# Patient Record
Sex: Female | Born: 1990 | Race: Black or African American | Hispanic: No | Marital: Single | Smoking: Former smoker
Health system: Southern US, Community
[De-identification: ages and names within clinical notes are randomized; demographics above are authoritative.]

## PROBLEM LIST (undated history)

## (undated) ENCOUNTER — Inpatient Hospital Stay (HOSPITAL_COMMUNITY): Payer: Self-pay

## (undated) DIAGNOSIS — O09299 Supervision of pregnancy with other poor reproductive or obstetric history, unspecified trimester: Secondary | ICD-10-CM

## (undated) DIAGNOSIS — D573 Sickle-cell trait: Secondary | ICD-10-CM

## (undated) DIAGNOSIS — A159 Respiratory tuberculosis unspecified: Secondary | ICD-10-CM

## (undated) DIAGNOSIS — L309 Dermatitis, unspecified: Secondary | ICD-10-CM

## (undated) DIAGNOSIS — D649 Anemia, unspecified: Secondary | ICD-10-CM

## (undated) HISTORY — PX: THERAPEUTIC ABORTION: SHX798

## (undated) HISTORY — DX: Anemia, unspecified: D64.9

## (undated) HISTORY — PX: DILATION AND CURETTAGE OF UTERUS: SHX78

---
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
## 2011-01-07 ENCOUNTER — Other Ambulatory Visit: Payer: Self-pay | Admitting: Obstetrics & Gynecology

## 2011-01-07 ENCOUNTER — Ambulatory Visit (INDEPENDENT_AMBULATORY_CARE_PROVIDER_SITE_OTHER): Payer: Medicaid Other | Admitting: Family Medicine

## 2011-01-07 VITALS — BP 122/69 | Temp 97.6°F | Wt 151.6 lb

## 2011-01-07 DIAGNOSIS — O09299 Supervision of pregnancy with other poor reproductive or obstetric history, unspecified trimester: Secondary | ICD-10-CM

## 2011-01-07 DIAGNOSIS — O0990 Supervision of high risk pregnancy, unspecified, unspecified trimester: Secondary | ICD-10-CM

## 2011-01-07 DIAGNOSIS — Z23 Encounter for immunization: Secondary | ICD-10-CM

## 2011-01-07 LAB — POCT URINALYSIS DIP (DEVICE)
Bilirubin Urine: NEGATIVE
Glucose, UA: NEGATIVE mg/dL
Hgb urine dipstick: NEGATIVE
Ketones, ur: NEGATIVE mg/dL
Nitrite: NEGATIVE
Specific Gravity, Urine: 1.02 (ref 1.005–1.030)
pH: 6 (ref 5.0–8.0)

## 2011-01-07 LAB — CBC
HCT: 34.7 % — ABNORMAL LOW (ref 36.0–46.0)
Hemoglobin: 12.3 g/dL (ref 12.0–15.0)
MCH: 27.7 pg (ref 26.0–34.0)
MCHC: 35.4 g/dL (ref 30.0–36.0)
MCV: 78.2 fL (ref 78.0–100.0)
RBC: 4.44 MIL/uL (ref 3.87–5.11)

## 2011-01-07 MED ORDER — TETANUS-DIPHTH-ACELL PERTUSSIS 5-2.5-18.5 LF-MCG/0.5 IM SUSP
0.5000 mL | Freq: Once | INTRAMUSCULAR | Status: AC
  Administered 2011-01-07: 0.5 mL via INTRAMUSCULAR

## 2011-01-07 MED ORDER — INFLUENZA VIRUS VACC SPLIT PF IM SUSP
0.5000 mL | Freq: Once | INTRAMUSCULAR | Status: AC
  Administered 2011-01-07: 0.5 mL via INTRAMUSCULAR

## 2011-01-07 NOTE — Progress Notes (Signed)
Pulse-82.  Needs 28wk labs

## 2011-01-07 NOTE — Patient Instructions (Addendum)
Fetal Movement Counts Patient Name: __________________________________________________ Patient Due Date: ____________________ Kick counts is highly recommended in high risk pregnancies, but it is a good idea for every pregnant woman to do. Start counting fetal movements at 28 weeks of the pregnancy. Fetal movements increase after eating a full meal or eating or drinking something sweet (the blood sugar is higher). It is also important to drink plenty of fluids (well hydrated) before doing the count. Lie on your left side because it helps with the circulation or you can sit in a comfortable chair with your arms over your belly (abdomen) with no distractions around you. DOING THE COUNT  Try to do the count the same time of day each time you do it.   Mark the day and time, then see how long it takes for you to feel 10 movements (kicks, flutters, swishes, rolls). You should have at least 10 movements within 2 hours. You will most likely feel 10 movements in much less than 2 hours. If you do not, wait an hour and count again. After a couple of days you will see a pattern.   What you are looking for is a change in the pattern or not enough counts in 2 hours. Is it taking longer in time to reach 10 movements?  SEEK MEDICAL CARE IF:  You feel less than 10 counts in 2 hours. Tried twice.   No movement in one hour.   The pattern is changing or taking longer each day to reach 10 counts in 2 hours.   You feel the baby is not moving as it usually does.  Date: ____________ Movements: ____________ Start time: ____________ Finish time: ____________  Date: ____________ Movements: ____________ Start time: ____________ Finish time: ____________ Date: ____________ Movements: ____________ Start time: ____________ Finish time: ____________ Date: ____________ Movements: ____________ Start time: ____________ Finish time: ____________ Date: ____________ Movements: ____________ Start time: ____________ Finish time:  ____________ Date: ____________ Movements: ____________ Start time: ____________ Finish time: ____________ Date: ____________ Movements: ____________ Start time: ____________ Finish time: ____________ Date: ____________ Movements: ____________ Start time: ____________ Finish time: ____________  Date: ____________ Movements: ____________ Start time: ____________ Finish time: ____________ Date: ____________ Movements: ____________ Start time: ____________ Finish time: ____________ Date: ____________ Movements: ____________ Start time: ____________ Finish time: ____________ Date: ____________ Movements: ____________ Start time: ____________ Finish time: ____________ Date: ____________ Movements: ____________ Start time: ____________ Finish time: ____________ Date: ____________ Movements: ____________ Start time: ____________ Finish time: ____________ Date: ____________ Movements: ____________ Start time: ____________ Finish time: ____________  Date: ____________ Movements: ____________ Start time: ____________ Finish time: ____________ Date: ____________ Movements: ____________ Start time: ____________ Finish time: ____________ Date: ____________ Movements: ____________ Start time: ____________ Finish time: ____________ Date: ____________ Movements: ____________ Start time: ____________ Finish time: ____________ Date: ____________ Movements: ____________ Start time: ____________ Finish time: ____________ Date: ____________ Movements: ____________ Start time: ____________ Finish time: ____________ Date: ____________ Movements: ____________ Start time: ____________ Finish time: ____________  Date: ____________ Movements: ____________ Start time: ____________ Finish time: ____________ Date: ____________ Movements: ____________ Start time: ____________ Finish time: ____________ Date: ____________ Movements: ____________ Start time: ____________ Finish time: ____________ Date: ____________ Movements:  ____________ Start time: ____________ Finish time: ____________ Date: ____________ Movements: ____________ Start time: ____________ Finish time: ____________ Date: ____________ Movements: ____________ Start time: ____________ Finish time: ____________ Date: ____________ Movements: ____________ Start time: ____________ Finish time: ____________  Date: ____________ Movements: ____________ Start time: ____________ Finish time: ____________ Date: ____________ Movements: ____________ Start time: ____________ Finish time: ____________ Date: ____________ Movements: ____________ Start time:   ____________ Doreatha Martin time: ____________ Date: ____________ Movements: ____________ Start time: ____________ Doreatha Martin time: ____________ Date: ____________ Movements: ____________ Start time: ____________ Doreatha Martin time: ____________ Date: ____________ Movements: ____________ Start time: ____________ Doreatha Martin time: ____________ Date: ____________ Movements: ____________ Start time: ____________ Doreatha Martin time: ____________  Date: ____________ Movements: ____________ Start time: ____________ Doreatha Martin time: ____________ Date: ____________ Movements: ____________ Start time: ____________ Doreatha Martin time: ____________ Date: ____________ Movements: ____________ Start time: ____________ Doreatha Martin time: ____________ Date: ____________ Movements: ____________ Start time: ____________ Doreatha Martin time: ____________ Date: ____________ Movements: ____________ Start time: ____________ Doreatha Martin time: ____________ Date: ____________ Movements: ____________ Start time: ____________ Doreatha Martin time: ____________ Date: ____________ Movements: ____________ Start time: ____________ Doreatha Martin time: ____________  Date: ____________ Movements: ____________ Start time: ____________ Doreatha Martin time: ____________ Date: ____________ Movements: ____________ Start time: ____________ Doreatha Martin time: ____________ Date: ____________ Movements: ____________ Start time: ____________ Doreatha Martin  time: ____________ Date: ____________ Movements: ____________ Start time: ____________ Doreatha Martin time: ____________ Date: ____________ Movements: ____________ Start time: ____________ Doreatha Martin time: ____________ Date: ____________ Movements: ____________ Start time: ____________ Doreatha Martin time: ____________ Date: ____________ Movements: ____________ Start time: ____________ Doreatha Martin time: ____________  Date: ____________ Movements: ____________ Start time: ____________ Doreatha Martin time: ____________ Date: ____________ Movements: ____________ Start time: ____________ Doreatha Martin time: ____________ Date: ____________ Movements: ____________ Start time: ____________ Doreatha Martin time: ____________ Date: ____________ Movements: ____________ Start time: ____________ Doreatha Martin time: ____________ Date: ____________ Movements: ____________ Start time: ____________ Doreatha Martin time: ____________ Date: ____________ Movements: ____________ Start time: ____________ Doreatha Martin time: ____________ Document Released: 03/24/2006 Document Revised: 11/04/2010 Document Reviewed: 09/24/2008 [employer] Patient Information 2012 [employer]. Pregnancy - Third Trimester The third trimester of pregnancy (the last 3 months) is a period of the most rapid growth for you and your baby. The baby approaches a length of 20 inches and a weight of 6 to 10 pounds. The baby is adding on fat and getting ready for life outside your body. While inside, babies have periods of sleeping and waking, suck their thumbs, and hiccups. You can often feel small contractions of the uterus. This is false labor. It is also called Braxton-Hicks contractions. This is like a practice for labor. The usual problems in this stage of pregnancy include more difficulty breathing, swelling of the hands and feet from water retention, and having to urinate more often because of the uterus and baby pressing on your bladder.  PRENATAL EXAMS  Blood work may continue to be done during prenatal exams.  These tests are done to check on your health and the probable health of your baby. Blood work is used to follow your blood levels (hemoglobin). Anemia (low hemoglobin) is common during pregnancy. Iron and vitamins are given to help prevent this. You may also continue to be checked for diabetes. Some of the past blood tests may be done again.   The size of the uterus is measured during each visit. This makes sure your baby is growing properly according to your pregnancy dates.   Your blood pressure is checked every prenatal visit. This is to make sure you are not getting toxemia.   Your urine is checked every prenatal visit for infection, diabetes and protein.   Your weight is checked at each visit. This is done to make sure gains are happening at the suggested rate and that you and your baby are growing normally.   Sometimes, an ultrasound is performed to confirm the position and the proper growth and development of the baby. This is a test done that bounces harmless sound waves off the baby so your caregiver can more  accurately determine due dates.   Discuss the type of pain medication and anesthesia you will have during your labor and delivery.   Discuss the possibility and anesthesia if a Cesarean Section might be necessary.   Inform your caregiver if there is any mental or physical violence at home.  Sometimes, a specialized non-stress test, contraction stress test and biophysical profile are done to make sure the baby is not having a problem. Checking the amniotic fluid surrounding the baby is called an amniocentesis. The amniotic fluid is removed by sticking a needle into the belly (abdomen). This is sometimes done near the end of pregnancy if an early delivery is required. In this case, it is done to help make sure the baby's lungs are mature enough for the baby to live outside of the womb. If the lungs are not mature and it is unsafe to deliver the baby, an injection of cortisone medication  is given to the mother 1 to 2 days before the delivery. This helps the baby's lungs mature and makes it safer to deliver the baby. CHANGES OCCURING IN THE THIRD TRIMESTER OF PREGNANCY Your body goes through many changes during pregnancy. They vary from person to person. Talk to your caregiver about changes you notice and are concerned about.  During the last trimester, you have probably had an increase in your appetite. It is normal to have cravings for certain foods. This varies from person to person and pregnancy to pregnancy.   You may begin to get stretch marks on your hips, abdomen, and breasts. These are normal changes in the body during pregnancy. There are no exercises or medications to take which prevent this change.   Constipation may be treated with a stool softener or adding bulk to your diet. Drinking lots of fluids, fiber in vegetables, fruits, and whole grains are helpful.   Exercising is also helpful. If you have been very active up until your pregnancy, most of these activities can be continued during your pregnancy. If you have been less active, it is helpful to start an exercise program such as walking. Consult your caregiver before starting exercise programs.   Avoid all smoking, alcohol, un-prescribed drugs, herbs and "street drugs" during your pregnancy. These chemicals affect the formation and growth of the baby. Avoid chemicals throughout the pregnancy to ensure the delivery of a healthy infant.   Backache, varicose veins and hemorrhoids may develop or get worse.   You will tire more easily in the third trimester, which is normal.   The baby's movements may be stronger and more often.   You may become short of breath easily.   Your belly button may stick out.   A yellow discharge may leak from your breasts called colostrum.   You may have a bloody mucus discharge. This usually occurs a few days to a week before labor begins.  HOME CARE INSTRUCTIONS   Keep your  caregiver's appointments. Follow your caregiver's instructions regarding medication use, exercise, and diet.   During pregnancy, you are providing food for you and your baby. Continue to eat regular, well-balanced meals. Choose foods such as meat, fish, milk and other low fat dairy products, vegetables, fruits, and whole-grain breads and cereals. Your caregiver will tell you of the ideal weight gain.   A physical sexual relationship may be continued throughout pregnancy if there are no other problems such as early (premature) leaking of amniotic fluid from the membranes, vaginal bleeding, or belly (abdominal) pain.   Exercise regularly  if there are no restrictions. Check with your caregiver if you are unsure of the safety of your exercises. Greater weight gain will occur in the last 2 trimesters of pregnancy. Exercising helps:   Control your weight.   Get you in shape for labor and delivery.   You lose weight after you deliver.   Rest a lot with legs elevated, or as needed for leg cramps or low back pain.   Wear a good support or jogging bra for breast tenderness during pregnancy. This may help if worn during sleep. Pads or tissues may be used in the bra if you are leaking colostrum.   Do not use hot tubs, steam rooms, or saunas.   Wear your seat belt when driving. This protects you and your baby if you are in an accident.   Avoid raw meat, cat litter boxes and soil used by cats. These carry germs that can cause birth defects in the baby.   It is easier to loose urine during pregnancy. Tightening up and strengthening the pelvic muscles will help with this problem. You can practice stopping your urination while you are going to the bathroom. These are the same muscles you need to strengthen. It is also the muscles you would use if you were trying to stop from passing gas. You can practice tightening these muscles up 10 times a set and repeating this about 3 times per day. Once you know what  muscles to tighten up, do not perform these exercises during urination. It is more likely to cause an infection by backing up the urine.   Ask for help if you have financial, counseling or nutritional needs during pregnancy. Your caregiver will be able to offer counseling for these needs as well as refer you for other special needs.   Make a list of emergency phone numbers and have them available.   Plan on getting help from family or friends when you go home from the hospital.   Make a trial run to the hospital.   Take prenatal classes with the father to understand, practice and ask questions about the labor and delivery.   Prepare the baby's room/nursery.   Do not travel out of the city unless it is absolutely necessary and with the advice of your caregiver.   Wear only low or no heal shoes to have better balance and prevent falling.  MEDICATIONS AND DRUG USE IN PREGNANCY  Take prenatal vitamins as directed. The vitamin should contain 1 milligram of folic acid. Keep all vitamins out of reach of children. Only a couple vitamins or tablets containing iron may be fatal to a baby or young child when ingested.   Avoid use of all medications, including herbs, over-the-counter medications, not prescribed or suggested by your caregiver. Only take over-the-counter or prescription medicines for pain, discomfort, or fever as directed by your caregiver. Do not use aspirin, ibuprofen (Motrin, Advil, Nuprin) or naproxen (Aleve) unless OK'd by your caregiver.   Let your caregiver also know about herbs you may be using.   Alcohol is related to a number of birth defects. This includes fetal alcohol syndrome. All alcohol, in any form, should be avoided completely. Smoking will cause low birth rate and premature babies.   Street/illegal drugs are very harmful to the baby. They are absolutely forbidden. A baby born to an addicted mother will be addicted at birth. The baby will go through the same  withdrawal an adult does.  SEEK MEDICAL CARE IF: You have  any concerns or worries during your pregnancy. It is better to call with your questions if you feel they cannot wait, rather than worry about them. DECISIONS ABOUT CIRCUMCISION You may or may not know the sex of your baby. If you know your baby is a boy, it may be time to think about circumcision. Circumcision is the removal of the foreskin of the penis. This is the skin that covers the sensitive end of the penis. There is no proven medical need for this. Often this decision is made on what is popular at the time or based upon religious beliefs and social issues. You can discuss these issues with your caregiver or pediatrician. SEEK IMMEDIATE MEDICAL CARE IF:   An unexplained oral temperature above 102 F (38.9 C) develops, or as your caregiver suggests.   You have leaking of fluid from the vagina (birth canal). If leaking membranes are suspected, take your temperature and tell your caregiver of this when you call.   There is vaginal spotting, bleeding or passing clots. Tell your caregiver of the amount and how many pads are used.   You develop a bad smelling vaginal discharge with a change in the color from clear to white.   You develop vomiting that lasts more than 24 hours.   You develop chills or fever.   You develop shortness of breath.   You develop burning on urination.   You loose more than 2 pounds of weight or gain more than 2 pounds of weight or as suggested by your caregiver.   You notice sudden swelling of your face, hands, and feet or legs.   You develop belly (abdominal) pain. Round ligament discomfort is a common non-cancerous (benign) cause of abdominal pain in pregnancy. Your caregiver still must evaluate you.   You develop a severe headache that does not go away.   You develop visual problems, blurred or double vision.   If you have not felt your baby move for more than 1 hour. If you think the baby is not  moving as much as usual, eat something with sugar in it and lie down on your left side for an hour. The baby should move at least 4 to 5 times per hour. Call right away if your baby moves less than that.   You fall, are in a car accident or any kind of trauma.   There is mental or physical violence at home.  Document Released: 02/16/2001 Document Revised: 11/04/2010 Document Reviewed: 08/21/2008 [facility] Patient Information 2012 [place], [place]. Birth Control Choices Birth control is the use of any practices, methods, or devices to prevent pregnancy from happening in a sexually active woman.  Below are some birth control choices to help avoid pregnancy.  Not having sex (abstinence) is the surest form of birth control. This requires self-control. There is no risk of acquiring a sexually transmitted disease (STD), including acquired immunodeficiency syndrome (AIDS).   Periodic abstinence requires self-control during certain times of the month.   Calendar method, timing your menstrual periods from month to month.   Ovulation method is avoiding sexual intercourse around the time you produce an egg (ovulate).   Symptotherm method is avoiding sexual intercourse at the time of ovulation, using a thermometer and ovulation symptoms.   Post ovulation method is the timing of sexual intercourse after you ovulated.  These methods do not protect against STDs, including AIDS.  Birth control pills (BCPs) contain estrogen and progesterone hormone. These medicines work by stopping the egg from  forming in the ovary (ovulation). Birth control pills are prescribed by a caregiver who will ask you questions about the risks of taking BCPs. Birth control pills do not protect against STDs, including AIDS.   "Minipill" birth control pills have only the progesterone hormone. They are taken every day of each month and must be prescribed by your caregiver. They do not protect against STDs, including AIDS.   Emergency  contraception is often call the "morning after" pill. This pill can be taken right after sex or up to five days after sex if you think your birth control failed, you failed to use contraception, or you were forced to have sex. It is most effective the sooner you take the pills after having sexual intercourse. Do not use emergency contraception as your only form of birth control. Emergency contraceptive pills are available without a prescription. Check with your pharmacist.   Condoms are a thin sheath of latex, synthetic material, or lambskin worn over the penis during sexual intercourse. They can have a spermicide in or on them when you buy them. Latex condoms can prevent pregnancy and STDs. "Natural" or lambskin condoms can prevent pregnancy but may not protect against STDs, including AIDS.   Female condoms are a soft, loose-fitting sheath that is put into the vagina before sexual intercourse. They can prevent pregnancy and STDs, including AIDS.   Sponge is a soft, circular piece of polyurethane foam with spermicide in it that is inserted into the vagina after wetting it and before sexual intercourse. It does not require a prescription from your caregiver. It does not protect against STDs, including AIDS.   Diaphragm is a soft, latex, dome-shaped barrier that must be fitted by a caregiver. It is inserted into the vagina, along with a spermicidal jelly. After the proper fitting for a diaphragm, always insert the diaphragm before intercourse. The diaphragm should be left in the vagina for 6 to 8 hours after intercourse. Removal and reinsertion with a spermicide is always necessary after any use. It does not protect against STDs, including AIDS.   Progesterone-only injections are given every 3 months to prevent pregnancy. These injections contain synthetic progesterone and no estrogen. This hormone stops the ovaries from releasing eggs. It also causes the cervical mucus to thicken and changes the uterine  lining. This makes it harder for sperm to survive in the uterus. It does not protect against STDs, including AIDS.   Birth Control Patch contains hormones similar to those in birth control pills, so effectiveness, risks, and side effects are similar. It must be changed once a week and is prescribed by a caregiver. It is less effective in very overweight women. It does not protect against STDs, including AIDS.   Vaginal Ring contains hormones similar to those in birth control pills. It is left in place for 3 weeks, removed for 1 week, and then a new one is put back into the vagina. It comes with a timer to put in your purse to help you remember when to take it out or put a new one in. A caregiver's examination and prescription is necessary, just like with birth control pills and the patch. It does not protect against STDs, including AIDS.   Estrogen plus progesterone injections are given every 28 to 30 days. They can be given in the upper arm, thigh, or buttocks. It does not protect against STDs, including AIDS.   Intrauterine device (IUD): copper T or progestin filled is a T-shaped device that is put in  a woman's uterus during a menstrual period to prevent pregnancy. The copper T IUD can last 10 years, and the progestin IUD can last 5 years. The progestin IUD can also help control heavy menstrual periods. It does not protect against STDs, including AIDS. The copper T IUD can be used as emergency contraception if inserted within 5 days of having unprotected intercourse.   Cervical cap is a round, soft latex or plastic cup that fits over the cervix and must be fitted by a caregiver. You do not need to use a spermicide with it or remove and insert it every time you have sexual intercourse. It does not protect against STDs, including AIDS.   Spermicides are chemicals that kill or block sperm from entering the cervix and uterus. They come in the form of creams, jellies, suppositories, foam, or tablets, and  they do not require a prescription. They are inserted into the vagina with an applicator before having sexual intercourse. This must be repeated every time you have sexual intercourse.   Withdrawal is using the method of the female withdrawing his penis from sexual intercourse before he has a climax and deposits his sperm. It does not protect against STDs, including AIDS.   Female tubal ligation is when the woman's fallopian tubes are surgically sealed or tied to prevent the egg from traveling to the uterus. It does not protect against STDs, including AIDS.   Female sterilization is when the female has his tubes that carry sperm tied off (vasectomy) to stop sperm from entering the vagina during sexual intercourse. It does not protect against STDs, including AIDS.  Regardless of which method of birth control you choose, it is still important that you use some form of protection against STDs. Document Released: 02/22/2005 Document Revised: 03/27/2010 Document Reviewed: 01/09/2009 [facility] Patient Information 2012 [place], [place]. Breastfeeding BENEFITS OF BREASTFEEDING For the baby  The first milk (colostrum) helps the baby's digestive system function better.   There are antibodies from the mother in the milk that help the baby fight off infections.   The baby has a lower incidence of asthma, allergies, and SIDS (sudden infant death syndrome).   The nutrients in breast milk are better than formulas for the baby and helps the baby's brain grow better.   Babies who breastfeed have less gas, colic, and constipation.  For the mother  Breastfeeding helps develop a very special bond between mother and baby.   It is more convenient, always available at the correct temperature and cheaper than formula feeding.   It burns calories in the mother and helps with losing weight that was gained during pregnancy.   It makes the uterus contract back down to normal size faster and slows bleeding following  delivery.   Breastfeeding mothers have a lower risk of developing breast cancer.  NURSE FREQUENTLY  A healthy, full-term baby may breastfeed as often as every hour or space his or her feedings to every 3 hours.   How often to nurse will vary from baby to baby. Watch your baby for signs of hunger, not the clock.   Nurse as often as the baby requests, or when you feel the need to reduce the fullness of your breasts.   Awaken the baby if it has been 3 to 4 hours since the last feeding.   Frequent feeding will help the mother make more milk and will prevent problems like sore nipples and engorgement of the breasts.  BABY'S POSITION AT THE BREAST  Whether lying  down or sitting, be sure that the baby's tummy is facing your tummy.   Support the breast with 4 fingers underneath the breast and the thumb above. Make sure your fingers are well away from the nipple and baby's mouth.   Stroke the baby's lips and cheek closest to the breast gently with your finger or nipple.   When the baby's mouth is open wide enough, place all of your nipple and as much of the dark area around the nipple as possible into your baby's mouth.   Pull the baby in close so the tip of the nose and the baby's cheeks touch the breast during the feeding.  FEEDINGS  The length of each feeding varies from baby to baby and from feeding to feeding.   The baby must suck about 2 to 3 minutes for your milk to get to him or her. This is called a "let down." For this reason, allow the baby to feed on each breast as long as he or she wants. Your baby will end the feeding when he or she has received the right balance of nutrients.   To break the suction, put your finger into the corner of the baby's mouth and slide it between his or her gums before removing your breast from his or her mouth. This will help prevent sore nipples.  REDUCING BREAST ENGORGEMENT  In the first week after your baby is born, you may experience signs of  breast engorgement. When breasts are engorged, they feel heavy, warm, full, and may be tender to the touch. You can reduce engorgement if you:   Nurse frequently, every 2 to 3 hours. Mothers who breastfeed early and often have fewer problems with engorgement.   Place light ice packs on your breasts between feedings. This reduces swelling. Wrap the ice packs in a lightweight towel to protect your skin.   Apply moist hot packs to your breast for 5 to 10 minutes before each feeding. This increases circulation and helps the milk flow.   Gently massage your breast before and during the feeding.   Make sure that the baby empties at least one breast at every feeding before switching sides.   Use a breast pump to empty the breasts if your baby is sleepy or not nursing well. You may also want to pump if you are returning to work or or you feel you are getting engorged.   Avoid bottle feeds, pacifiers or supplemental feedings of water or juice in place of breastfeeding.   Be sure the baby is latched on and positioned properly while breastfeeding.   Prevent fatigue, stress, and anemia.   Wear a supportive bra, avoiding underwire styles.   Eat a balanced diet with enough fluids.  If you follow these suggestions, your engorgement should improve in 24 to 48 hours. If you are still experiencing difficulty, call your lactation consultant or caregiver. IS MY BABY GETTING ENOUGH MILK? Sometimes, mothers worry about whether their babies are getting enough milk. You can be assured that your baby is getting enough milk if:  The baby is actively sucking and you hear swallowing.   The baby nurses at least 8 to 12 times in a 24 hour time period. Nurse your baby until he or she unlatches or falls asleep at the first breast (at least 10 to 20 minutes), then offer the second side.   The baby is wetting 5 to 6 disposable diapers (6 to 8 cloth diapers) in a 24 hour period  by 36 to 43 days of age.   The baby is  having at least 2 to 3 stools every 24 hours for the first few months. Breast milk is all the food your baby needs. It is not necessary for your baby to have water or formula. In fact, to help your breasts make more milk, it is best not to give your baby supplemental feedings during the early weeks.   The stool should be soft and yellow.   The baby should gain 4 to 7 ounces per week after he is 47 days old.  TAKE CARE OF YOURSELF Take care of your breasts by:  Bathing or showering daily.   Avoiding the use of soaps on your nipples.   Start feedings on your left breast at one feeding and on your right breast at the next feeding.   You will notice an increase in your milk supply 2 to 5 days after delivery. You may feel some discomfort from engorgement, which makes your breasts very firm and often tender. Engorgement "peaks" out within 24 to 48 hours. In the meantime, apply warm moist towels to your breasts for 5 to 10 minutes before feeding. Gentle massage and expression of some milk before feeding will soften your breasts, making it easier for your baby to latch on. Wear a well fitting nursing bra and air dry your nipples for 10 to 15 minutes after each feeding.   Only use cotton bra pads.   Only use pure lanolin on your nipples after nursing. You do not need to wash it off before nursing.  Take care of yourself by:   Eating well-balanced meals and nutritious snacks.   Drinking milk, fruit juice, and water to satisfy your thirst (about 8 glasses a day).   Getting plenty of rest.   Increasing calcium in your diet (1200 mg a day).   Avoiding foods that you notice affect the baby in a bad way.  SEEK MEDICAL CARE IF:   You have any questions or difficulty with breastfeeding.   You need help.   You have a hard, red, sore area on your breast, accompanied by a fever of 100.5 F (38.1 C) or more.   Your baby is too sleepy to eat well or is having trouble sleeping.   Your baby is  wetting less than 6 diapers per day, by 78 days of age.   Your baby's skin or white part of his or her eyes is more yellow than it was in the hospital.   You feel depressed.  Document Released: 02/22/2005 Document Revised: 11/04/2010 Document Reviewed: 10/07/2008 [facility] Patient Information 2012 [place], [place].

## 2011-01-07 NOTE — Progress Notes (Signed)
Doing well.  28 wk labs today, start weekly Bpp's next week, growth next week.

## 2011-01-08 LAB — GLUCOSE TOLERANCE, 1 HOUR: Glucose, 1 Hour GTT: 93 mg/dL (ref 70–140)

## 2011-01-11 ENCOUNTER — Telehealth: Payer: Self-pay | Admitting: *Deleted

## 2011-01-11 NOTE — Telephone Encounter (Signed)
Patient called today at [phone] and left a message " I wanted to see if I could take a cough medicine?"

## 2011-01-11 NOTE — Telephone Encounter (Signed)
Called patient and informed her may take delsym, robitussin plain or dm

## 2011-01-14 ENCOUNTER — Ambulatory Visit (INDEPENDENT_AMBULATORY_CARE_PROVIDER_SITE_OTHER): Payer: Medicaid Other | Admitting: Family Medicine

## 2011-01-14 ENCOUNTER — Other Ambulatory Visit: Payer: Self-pay | Admitting: Family Medicine

## 2011-01-14 VITALS — BP 119/70 | Temp 98.2°F | Wt 150.6 lb

## 2011-01-14 DIAGNOSIS — O0990 Supervision of high risk pregnancy, unspecified, unspecified trimester: Secondary | ICD-10-CM

## 2011-01-14 DIAGNOSIS — O09299 Supervision of pregnancy with other poor reproductive or obstetric history, unspecified trimester: Secondary | ICD-10-CM

## 2011-01-14 LAB — POCT URINALYSIS DIP (DEVICE)
Bilirubin Urine: NEGATIVE
Glucose, UA: NEGATIVE mg/dL
Hgb urine dipstick: NEGATIVE
Ketones, ur: NEGATIVE mg/dL
Nitrite: NEGATIVE
Specific Gravity, Urine: 1.02 (ref 1.005–1.030)
pH: 6.5 (ref 5.0–8.0)

## 2011-01-14 NOTE — Progress Notes (Signed)
Start weekly BPP's

## 2011-01-15 ENCOUNTER — Ambulatory Visit (HOSPITAL_COMMUNITY): Payer: Medicaid Other

## 2011-01-15 ENCOUNTER — Encounter (HOSPITAL_COMMUNITY): Payer: Self-pay | Admitting: *Deleted

## 2011-01-15 ENCOUNTER — Observation Stay (HOSPITAL_COMMUNITY)
Admission: AD | Admit: 2011-01-15 | Discharge: 2011-01-16 | Disposition: A | Discharge status: Home / Self Care | Payer: Medicaid Other | Source: Ambulatory Visit | Attending: Obstetrics & Gynecology | Admitting: Obstetrics & Gynecology

## 2011-01-15 ENCOUNTER — Inpatient Hospital Stay (HOSPITAL_COMMUNITY): Payer: Medicaid Other

## 2011-01-15 DIAGNOSIS — A499 Bacterial infection, unspecified: Secondary | ICD-10-CM | POA: Insufficient documentation

## 2011-01-15 DIAGNOSIS — N760 Acute vaginitis: Secondary | ICD-10-CM

## 2011-01-15 DIAGNOSIS — J069 Acute upper respiratory infection, unspecified: Secondary | ICD-10-CM | POA: Insufficient documentation

## 2011-01-15 DIAGNOSIS — O09299 Supervision of pregnancy with other poor reproductive or obstetric history, unspecified trimester: Secondary | ICD-10-CM

## 2011-01-15 DIAGNOSIS — O2390 Unspecified genitourinary tract infection in pregnancy, unspecified trimester: Secondary | ICD-10-CM

## 2011-01-15 DIAGNOSIS — O368390 Maternal care for abnormalities of the fetal heart rate or rhythm, unspecified trimester, not applicable or unspecified: Secondary | ICD-10-CM | POA: Insufficient documentation

## 2011-01-15 DIAGNOSIS — O9989 Other specified diseases and conditions complicating pregnancy, childbirth and the puerperium: Secondary | ICD-10-CM

## 2011-01-15 DIAGNOSIS — IMO0002 Reserved for concepts with insufficient information to code with codable children: Secondary | ICD-10-CM

## 2011-01-15 DIAGNOSIS — B9689 Other specified bacterial agents as the cause of diseases classified elsewhere: Secondary | ICD-10-CM | POA: Insufficient documentation

## 2011-01-15 DIAGNOSIS — O99891 Other specified diseases and conditions complicating pregnancy: Principal | ICD-10-CM | POA: Insufficient documentation

## 2011-01-15 DIAGNOSIS — D573 Sickle-cell trait: Secondary | ICD-10-CM | POA: Insufficient documentation

## 2011-01-15 HISTORY — DX: Sickle-cell trait: D57.3

## 2011-01-15 LAB — URINALYSIS, ROUTINE W REFLEX MICROSCOPIC
Bilirubin Urine: NEGATIVE
Hgb urine dipstick: NEGATIVE
Specific Gravity, Urine: 1.02 (ref 1.005–1.030)
Urobilinogen, UA: 0.2 mg/dL (ref 0.0–1.0)
pH: 6.5 (ref 5.0–8.0)

## 2011-01-15 LAB — WET PREP, GENITAL: Trich, Wet Prep: NONE SEEN

## 2011-01-15 MED ORDER — DM-GUAIFENESIN ER 30-600 MG PO TB12
1.0000 | ORAL_TABLET | Freq: Two times a day (BID) | ORAL | Status: DC
  Administered 2011-01-15 – 2011-01-16 (×3): 1 via ORAL
  Filled 2011-01-15 (×5): qty 1

## 2011-01-15 MED ORDER — LORATADINE 10 MG PO TABS
10.0000 mg | ORAL_TABLET | Freq: Every day | ORAL | Status: DC
  Administered 2011-01-15 – 2011-01-16 (×2): 10 mg via ORAL
  Filled 2011-01-15 (×3): qty 1

## 2011-01-15 MED ORDER — CALCIUM CARBONATE ANTACID 500 MG PO CHEW
2.0000 | CHEWABLE_TABLET | ORAL | Status: DC | PRN
  Filled 2011-01-15: qty 2

## 2011-01-15 MED ORDER — IPRATROPIUM BROMIDE 0.02 % IN SOLN
0.5000 mg | Freq: Once | RESPIRATORY_TRACT | Status: AC
  Administered 2011-01-15: 0.5 mg via RESPIRATORY_TRACT
  Filled 2011-01-15: qty 2.5

## 2011-01-15 MED ORDER — ALBUTEROL SULFATE (5 MG/ML) 0.5% IN NEBU
5.0000 mg | INHALATION_SOLUTION | RESPIRATORY_TRACT | Status: DC | PRN
  Filled 2011-01-15: qty 1

## 2011-01-15 MED ORDER — IPRATROPIUM BROMIDE 0.02 % IN SOLN
0.5000 mg | RESPIRATORY_TRACT | Status: DC | PRN
  Filled 2011-01-15: qty 2.5

## 2011-01-15 MED ORDER — ALBUTEROL SULFATE (5 MG/ML) 0.5% IN NEBU
5.0000 mg | INHALATION_SOLUTION | Freq: Once | RESPIRATORY_TRACT | Status: AC
  Administered 2011-01-15: 5 mg via RESPIRATORY_TRACT
  Filled 2011-01-15 (×2): qty 0.5

## 2011-01-15 MED ORDER — PRENATAL PLUS 27-1 MG PO TABS
1.0000 | ORAL_TABLET | Freq: Every day | ORAL | Status: DC
  Administered 2011-01-15 – 2011-01-16 (×2): 1 via ORAL
  Filled 2011-01-15 (×4): qty 1

## 2011-01-15 MED ORDER — DOCUSATE SODIUM 100 MG PO CAPS
100.0000 mg | ORAL_CAPSULE | Freq: Every day | ORAL | Status: DC
  Administered 2011-01-15 – 2011-01-16 (×2): 100 mg via ORAL
  Filled 2011-01-15 (×4): qty 1

## 2011-01-15 MED ORDER — METRONIDAZOLE 500 MG PO TABS
500.0000 mg | ORAL_TABLET | Freq: Two times a day (BID) | ORAL | Status: DC
  Administered 2011-01-15 – 2011-01-16 (×3): 500 mg via ORAL
  Filled 2011-01-15 (×5): qty 1

## 2011-01-15 MED ORDER — HYDROCOD POLST-CHLORPHEN POLST 10-8 MG/5ML PO LQCR: 5.0000 mL | Freq: Two times a day (BID) | ORAL | Status: DC | PRN

## 2011-01-15 MED ORDER — ZOLPIDEM TARTRATE 10 MG PO TABS: 10.0000 mg | ORAL_TABLET | Freq: Every evening | ORAL | Status: DC | PRN

## 2011-01-15 MED ORDER — ACETAMINOPHEN 325 MG PO TABS: 650.0000 mg | ORAL_TABLET | ORAL | Status: DC | PRN

## 2011-01-15 NOTE — H&P (Signed)
I saw and examined patient along with student and agree with above note.   

## 2011-01-15 NOTE — Progress Notes (Signed)
PT SAYS SHE HAS SNEEZING, RUNNY NOSE,  COUGH,  NO SORE THROAT,    NO VOMITING,   NO DIARRHEA. Marland Kitchen GOES TO [employer]- DID NOT CALL ABOUT HEAD COLD.  HAS NOT TAKEN  ANY MED.     HAS HAD COLD X1  WEEK.   SAYS HAS PAIN IN RIBS  AND VAGINA- FEELS LIKE PRESSURE AND STRETCHING.  - STARTED AT  0100  .  HAS STILLBORN IN 2010.

## 2011-01-15 NOTE — Progress Notes (Signed)
PT BROUGHT WITH  HER  A PIECE OF MUCUS IN A BAG

## 2011-01-15 NOTE — Progress Notes (Signed)
UR Chart review completed.  

## 2011-01-15 NOTE — ED Provider Notes (Signed)
History   20 y.o. G2P1000 at [redacted]w[redacted]d presents with report of sneezing, rhinorrhea, productive cough of thick yellow phlegm x approximately 1 week.  Denies sore throat.  Hasn't used any OTC meds.  Denies SOB, CP, n/v/d, ear pain.  Also reports intermittent sharp pelvic pain and achiness with position change or fetal movement.  Reports +fm, denies LOF, VB, or UCs.  Did have thick clearish-yellow vaginal mucous earlier that she brought in a bag.      HPI  OB History    Grav Para Term Preterm Abortions TAB SAB Ect Mult Living   2 1 1        0      Past Medical History  Diagnosis Date   Eczema    Anemia     History reviewed. No pertinent past surgical history.  Family History  Problem Relation Age of Onset   Hypertension Father     History  Substance Use Topics   Smoking status: Never Smoker    Smokeless tobacco: Never Used   Alcohol Use: No    Allergies: No Known Allergies  Prescriptions prior to admission  Medication Sig Dispense Refill   prenatal vitamin w/FE, FA (PRENATAL 1 + 1) 27-1 MG TABS Take 1 tablet by mouth daily.         DISCONTD: Prenatal Vit-Fe Fumarate-FA (PRENATAL MULTIVITAMIN) 60-1 MG tablet Take 1 tablet by mouth daily.  30 tablet  0    Review of Systems  Constitutional: Negative.   HENT:       Rhinorrhea  Eyes: Negative.   Respiratory: Positive for cough, sputum production (thick clearish-yellow phlegm) and wheezing. Negative for shortness of breath.   Cardiovascular: Negative.   Gastrointestinal: Negative.  Negative for nausea, vomiting, abdominal pain and diarrhea.  Genitourinary: Negative for dysuria, frequency and flank pain.       Intermittent sharp pelvic pain and achiness with position change and fetal movement  Musculoskeletal: Negative.   Skin: Negative.   Neurological: Negative.   Endo/Heme/Allergies: Negative.   Psychiatric/Behavioral: Negative.    Physical Exam   Blood pressure 111/85, pulse 70, temperature 98 F (36.7 C),  temperature source Oral, resp. rate 20, height 5\' 4"  (1.626 m), weight 70.478 kg (155 lb 6 oz), last menstrual period 07/02/2010, SpO2 98.00%.  Physical Exam  Constitutional: She is oriented to person, place, and time. She appears well-developed and well-nourished.  HENT:  Head: Normocephalic.  Eyes: Pupils are equal, round, and reactive to light.  Neck: Normal range of motion.  Cardiovascular: Normal rate, regular rhythm and normal heart sounds.   Respiratory: Effort normal. No respiratory distress. She has wheezes (inspiratory, bilateral upper lobes). She has no rales. She exhibits no tenderness.       No SOB  GI: Soft. Bowel sounds are normal. There is tenderness (pelvic).  Genitourinary: Uterus normal. Vaginal discharge (thick clearish-yellow mucous coming from os) found.       Cervix visually closed on speculum exam   Musculoskeletal: Normal range of motion.  Neurological: She is alert and oriented to person, place, and time. She has normal reflexes.  Skin: Skin is warm and dry.  Psychiatric: She has a normal mood and affect. Her behavior is normal. Judgment and thought content normal.    MAU Course  Procedures  Wet prep GC/CT Results for orders placed during the hospital encounter of 01/15/11 (from the past 24 hour(s))  URINALYSIS, ROUTINE W REFLEX MICROSCOPIC     Status: Normal   Collection Time   01/15/11  1:39 AM      Component Value Range   Color, Urine YELLOW  YELLOW    Appearance CLEAR  CLEAR    Specific Gravity, Urine 1.020  1.005 - 1.030    pH 6.5  5.0 - 8.0    Glucose, UA NEGATIVE  NEGATIVE (mg/dL)   Hgb urine dipstick NEGATIVE  NEGATIVE    Bilirubin Urine NEGATIVE  NEGATIVE    Ketones, ur NEGATIVE  NEGATIVE (mg/dL)   Protein, ur NEGATIVE  NEGATIVE (mg/dL)   Urobilinogen, UA 0.2  0.0 - 1.0 (mg/dL)   Nitrite NEGATIVE  NEGATIVE    Leukocytes, UA NEGATIVE  NEGATIVE   WET PREP, GENITAL     Status: Abnormal   Collection Time   01/15/11  4:15 AM      Component  Value Range   Yeast, Wet Prep NONE SEEN  NONE SEEN    Trich, Wet Prep NONE SEEN  NONE SEEN    Clue Cells, Wet Prep MODERATE (*) NONE SEEN    WBC, Wet Prep HPF POC FEW (*) NONE SEEN       Assessment and Plan  IUP @ 28.1wks URI Round ligament pain FHT: 135, moderate variability, 10x10 accels, spontaneous prolonged decel  Plan: Obtain BPP d/t prolonged decel and h/o IUFD Albuterol & Atrovent nebulizer    Joellyn Haff, SNM 01/15/2011, 4:02 AM

## 2011-01-15 NOTE — ED Provider Notes (Signed)
I saw and examined patient along with student and agree with above note.   

## 2011-01-15 NOTE — H&P (Signed)
Evelyn [name] is a 20 y.o. female G2P1000 @ [redacted]w[redacted]d presents with report of sneezing, rhinorrhea, productive cough of thick yellow phlegm x approximately 1 week.  Denies sore throat.  Hasn't used any OTC meds.  Denies SOB, CP, n/v/d, ear pain.  Also reports intermittent sharp pelvic pain and achiness with position change or fetal movement.  Reports +fm, denies LOF, VB, or UCs.  Did have thick clearish-yellow vaginal mucous earlier that she brought in a bag. . Maternal Medical History:  Reason for admission: Reason for Admission:   nauseaFetal activity: Last perceived fetal movement was within the past hour.    Prenatal complications: no prenatal complications   OB History    Grav Para Term Preterm Abortions TAB SAB Ect Mult Living   2 1 1        0     Past Medical History  Diagnosis Date   Eczema    Anemia    History reviewed. No pertinent past surgical history. Family History: family history includes Hypertension in her father. Social History:  reports that she has never smoked. She has never used smokeless tobacco. She reports that she does not drink alcohol or use illicit drugs.  Review of Systems  Constitutional: Negative.   HENT: Negative for ear pain and sore throat.        Rhinorrhea  Eyes: Negative.   Respiratory: Positive for cough and sputum production (thick clearish-yellow phlegm). Negative for shortness of breath. Wheezing: none after nebulizer.   Cardiovascular: Negative.   Gastrointestinal: Negative.  Negative for nausea, vomiting, abdominal pain and diarrhea.  Genitourinary: Negative for dysuria, frequency and flank pain.       Intermittent sharp pelvic pain and achiness with position change or fetal movement  Musculoskeletal: Negative.   Skin: Negative.   Neurological: Negative.   Endo/Heme/Allergies: Negative.   Psychiatric/Behavioral: Negative.       Blood pressure 111/85, pulse 70, temperature 98 F (36.7 C), temperature source Oral, resp. rate 20, height  5\' 4"  (1.626 m), weight 70.478 kg (155 lb 6 oz), last menstrual period 07/02/2010, SpO2 98.00%. Maternal Exam:  Abdomen: Slight pelvic tenderness  Introitus: Normal vulva. Normal vagina.    Physical Exam  Constitutional: She is oriented to person, place, and time. She appears well-developed and well-nourished.  HENT:  Head: Normocephalic.  Eyes: Pupils are equal, round, and reactive to light.  Neck: Normal range of motion.  Cardiovascular: Normal rate, regular rhythm and normal heart sounds.   Respiratory: Effort normal and breath sounds normal.       After nebulizer  GI: Soft. Bowel sounds are normal.       Mild pelvic tenderness  Genitourinary: Vagina normal and uterus normal.  Musculoskeletal: Normal range of motion.  Neurological: She is alert and oriented to person, place, and time. She has normal reflexes.  Skin: Skin is warm and dry.  Psychiatric: She has a normal mood and affect. Her behavior is normal. Judgment and thought content normal.    Prenatal labs: ABO, Rh: B/Positive/-- (08/27 0000) Antibody: Negative (08/27 0000) Rubella: Immune (08/27 0000) RPR: NON REAC (11/01 1130)  HBsAg: Negative (08/27 0000)  HIV: NON REACTIVE (11/01 1130)  GBS:     Assessment/Plan: IUP @ 28.1wks URI BV 4-22min Prolonged decel x1 Normal BPP 8/8  Plan: (discussed POC w/ dr. Penne Lash, who states to admit for 24hr observation d/t prolonged decel and h/o iufd) Admit for 24 hour observation Mucinex, claritin, cough syrup for URI Flagyl 500mg  PO BID x 7d for  BV Continuous fetal monitoring     Joellyn Haff, SNM 01/15/2011, 5:34 AM

## 2011-01-15 NOTE — ED Notes (Signed)
Bed requested for pt on Antenatal, unable to take pt. Until change of shift when they have 3 nurses.

## 2011-01-16 DIAGNOSIS — O368390 Maternal care for abnormalities of the fetal heart rate or rhythm, unspecified trimester, not applicable or unspecified: Secondary | ICD-10-CM

## 2011-01-16 MED ORDER — ALBUTEROL SULFATE (5 MG/ML) 0.5% IN NEBU: 5.0000 mg | INHALATION_SOLUTION | RESPIRATORY_TRACT | Status: DC | PRN

## 2011-01-16 MED ORDER — LORATADINE 10 MG PO TABS: 10.0000 mg | ORAL_TABLET | Freq: Every day | ORAL | Status: DC

## 2011-01-16 MED ORDER — HYDROCOD POLST-CHLORPHEN POLST 10-8 MG/5ML PO LQCR: 5.0000 mL | Freq: Two times a day (BID) | ORAL | Status: DC | PRN

## 2011-01-16 MED ORDER — METRONIDAZOLE 500 MG PO TABS: 500.0000 mg | ORAL_TABLET | Freq: Two times a day (BID) | ORAL | Status: AC

## 2011-01-16 MED ORDER — IPRATROPIUM BROMIDE 0.02 % IN SOLN: 0.5000 mg | RESPIRATORY_TRACT | Status: DC | PRN

## 2011-01-16 MED ORDER — ALBUTEROL SULFATE HFA 108 (90 BASE) MCG/ACT IN AERS: 2.0000 | INHALATION_SPRAY | Freq: Four times a day (QID) | RESPIRATORY_TRACT | Status: DC | PRN

## 2011-01-16 NOTE — Progress Notes (Signed)
S. No problems.  She says her cold is getting better.  She reports good FM and denies and ROM or VB.  O. VSS, AF       Abd-benign  FHR- reassuring  A/P. Random FHR deceleration now status post greater than 24 hours of reassuring fetal monitoring, reassuring BPP and AFI.  She will be discharged home and already has an appt in the high risk clinic on the 15th.

## 2011-01-16 NOTE — Discharge Summary (Signed)
Physician Discharge Summary  Patient ID: Evelyn [name] MRN: [id number] DOB/AGE: Aug 16, 1990 19 y.o.  Admit date: 01/15/2011 Discharge date: 01/16/2011  Admission Diagnoses: fetal deceleration  Discharge Diagnoses: same Active Problems:  * No active hospital problems. *    Discharged Condition: good  Hospital Course: She was seen in the MAU for "rhinitis".  While on the EFM, the FHR showed a 4 minute deceleration into the 100s.  A BPP was 8 of 8 and her AFI was 19.  She was kept on continuous EFM for greater than 24 hours after that.  The fetal heart rate showed one 2 minute deceleration during this time frame.  Consults: none  Significant Diagnostic Studies: EFM  Treatments: none  Discharge Exam: Blood pressure 118/63, pulse 68, temperature 98.2 F (36.8 C), temperature source Oral, resp. rate 20, height 5\' 4"  (1.626 m), weight 70.478 kg (155 lb 6 oz), last menstrual period 07/02/2010, SpO2 98.00%. General appearance: alert Abd- gravid, benign FHR- reassuring  Disposition: Home or Self Care  Discharge Orders    Future Appointments: Provider: Department: Dept Phone: Center:   01/21/2011 9:15 AM [facility] [phone]   01/29/2011 9:15 AM [facility] [phone]   02/01/2011 10:30 AM [facility] [phone] [facility]   02/04/2011 9:15 AM [facility] [phone]   02/19/2011 10:00 AM Rosalita Chessman E. Dorcas Carrow, CNM [facility] [phone] [facility]     Current Discharge Medication List    CONTINUE these medications which have NOT CHANGED   Details  prenatal vitamin w/FE, FA (PRENATAL 1 + 1) 27-1 MG TABS Take 1 tablet by mouth daily.        STOP taking these medications     Prenatal Vit-Fe Fumarate-FA (PRENATAL MULTIVITAMIN) 60-1 MG tablet          Signed: Ryot Burrous C. 01/16/2011, 1:50 PM

## 2011-01-21 ENCOUNTER — Telehealth: Payer: Self-pay | Admitting: *Deleted

## 2011-01-21 ENCOUNTER — Other Ambulatory Visit: Payer: Self-pay | Admitting: Family Medicine

## 2011-01-21 ENCOUNTER — Ambulatory Visit (HOSPITAL_COMMUNITY)
Admission: RE | Admit: 2011-01-21 | Discharge: 2011-01-21 | Disposition: A | Discharge status: Home / Self Care | Payer: Medicaid Other | Source: Ambulatory Visit | Attending: Family Medicine | Admitting: Family Medicine

## 2011-01-21 ENCOUNTER — Other Ambulatory Visit: Payer: Self-pay | Admitting: *Deleted

## 2011-01-21 DIAGNOSIS — O09299 Supervision of pregnancy with other poor reproductive or obstetric history, unspecified trimester: Secondary | ICD-10-CM | POA: Insufficient documentation

## 2011-01-21 DIAGNOSIS — O0990 Supervision of high risk pregnancy, unspecified, unspecified trimester: Secondary | ICD-10-CM

## 2011-01-21 DIAGNOSIS — Z3689 Encounter for other specified antenatal screening: Secondary | ICD-10-CM | POA: Insufficient documentation

## 2011-01-21 NOTE — Telephone Encounter (Signed)
Radiology called for order on patient. Put order in for BPP.

## 2011-01-29 ENCOUNTER — Other Ambulatory Visit: Payer: Self-pay | Admitting: Obstetrics & Gynecology

## 2011-01-29 ENCOUNTER — Ambulatory Visit (HOSPITAL_COMMUNITY)
Admission: RE | Admit: 2011-01-29 | Discharge: 2011-01-29 | Disposition: A | Discharge status: Home / Self Care | Payer: Medicaid Other | Source: Ambulatory Visit | Attending: Family Medicine | Admitting: Family Medicine

## 2011-01-29 DIAGNOSIS — O09299 Supervision of pregnancy with other poor reproductive or obstetric history, unspecified trimester: Secondary | ICD-10-CM | POA: Insufficient documentation

## 2011-01-29 DIAGNOSIS — O0990 Supervision of high risk pregnancy, unspecified, unspecified trimester: Secondary | ICD-10-CM

## 2011-01-29 DIAGNOSIS — Z3689 Encounter for other specified antenatal screening: Secondary | ICD-10-CM | POA: Insufficient documentation

## 2011-02-01 ENCOUNTER — Ambulatory Visit (INDEPENDENT_AMBULATORY_CARE_PROVIDER_SITE_OTHER): Payer: Medicaid Other | Admitting: Obstetrics and Gynecology

## 2011-02-01 DIAGNOSIS — O09299 Supervision of pregnancy with other poor reproductive or obstetric history, unspecified trimester: Secondary | ICD-10-CM

## 2011-02-01 DIAGNOSIS — O0990 Supervision of high risk pregnancy, unspecified, unspecified trimester: Secondary | ICD-10-CM

## 2011-02-01 DIAGNOSIS — D573 Sickle-cell trait: Secondary | ICD-10-CM

## 2011-02-01 LAB — POCT URINALYSIS DIP (DEVICE)
Hgb urine dipstick: NEGATIVE
Ketones, ur: NEGATIVE mg/dL
Protein, ur: NEGATIVE mg/dL
Specific Gravity, Urine: 1.02 (ref 1.005–1.030)
Urobilinogen, UA: 0.2 mg/dL (ref 0.0–1.0)

## 2011-02-01 NOTE — Progress Notes (Signed)
Doing well. Good FM. 11/24: BPP 8/8, cx 2.97, cephalic. Has [place] appt in 3 days.

## 2011-02-01 NOTE — Patient Instructions (Signed)
Fetal Movement Counts °Patient Name: __________________________________________________ Patient Due Date: ____________________ °Kick counts is highly recommended in high risk pregnancies, but it is a good idea for every pregnant woman to do. Start counting fetal movements at 28 weeks of the pregnancy. Fetal movements increase after eating a full meal or eating or drinking something sweet (the blood sugar is higher). It is also important to drink plenty of fluids (well hydrated) before doing the count. Lie on your left side because it helps with the circulation or you can sit in a comfortable chair with your arms over your belly (abdomen) with no distractions around you. °DOING THE COUNT °· Try to do the count the same time of day each time you do it.  °· Mark the day and time, then see how long it takes for you to feel 10 movements (kicks, flutters, swishes, rolls). You should have at least 10 movements within 2 hours. You will most likely feel 10 movements in much less than 2 hours. If you do not, wait an hour and count again. After a couple of days you will see a pattern.  °· What you are looking for is a change in the pattern or not enough counts in 2 hours. Is it taking longer in time to reach 10 movements?  °SEEK MEDICAL CARE IF: °· You feel less than 10 counts in 2 hours. Tried twice.  °· No movement in one hour.  °· The pattern is changing or taking longer each day to reach 10 counts in 2 hours.  °· You feel the baby is not moving as it usually does.  °Date: ____________ Movements: ____________ Start time: ____________ Finish time: ____________  °Date: ____________ Movements: ____________ Start time: ____________ Finish time: ____________ °Date: ____________ Movements: ____________ Start time: ____________ Finish time: ____________ °Date: ____________ Movements: ____________ Start time: ____________ Finish time: ____________ °Date: ____________ Movements: ____________ Start time: ____________ Finish time:  ____________ °Date: ____________ Movements: ____________ Start time: ____________ Finish time: ____________ °Date: ____________ Movements: ____________ Start time: ____________ Finish time: ____________ °Date: ____________ Movements: ____________ Start time: ____________ Finish time: ____________  °Date: ____________ Movements: ____________ Start time: ____________ Finish time: ____________ °Date: ____________ Movements: ____________ Start time: ____________ Finish time: ____________ °Date: ____________ Movements: ____________ Start time: ____________ Finish time: ____________ °Date: ____________ Movements: ____________ Start time: ____________ Finish time: ____________ °Date: ____________ Movements: ____________ Start time: ____________ Finish time: ____________ °Date: ____________ Movements: ____________ Start time: ____________ Finish time: ____________ °Date: ____________ Movements: ____________ Start time: ____________ Finish time: ____________  °Date: ____________ Movements: ____________ Start time: ____________ Finish time: ____________ °Date: ____________ Movements: ____________ Start time: ____________ Finish time: ____________ °Date: ____________ Movements: ____________ Start time: ____________ Finish time: ____________ °Date: ____________ Movements: ____________ Start time: ____________ Finish time: ____________ °Date: ____________ Movements: ____________ Start time: ____________ Finish time: ____________ °Date: ____________ Movements: ____________ Start time: ____________ Finish time: ____________ °Date: ____________ Movements: ____________ Start time: ____________ Finish time: ____________  °Date: ____________ Movements: ____________ Start time: ____________ Finish time: ____________ °Date: ____________ Movements: ____________ Start time: ____________ Finish time: ____________ °Date: ____________ Movements: ____________ Start time: ____________ Finish time: ____________ °Date: ____________ Movements:  ____________ Start time: ____________ Finish time: ____________ °Date: ____________ Movements: ____________ Start time: ____________ Finish time: ____________ °Date: ____________ Movements: ____________ Start time: ____________ Finish time: ____________ °Date: ____________ Movements: ____________ Start time: ____________ Finish time: ____________  °Date: ____________ Movements: ____________ Start time: ____________ Finish time: ____________ °Date: ____________ Movements: ____________ Start time: ____________ Finish time: ____________ °Date: ____________ Movements: ____________ Start time:   ____________ Finish time: ____________ °Date: ____________ Movements: ____________ Start time: ____________ Finish time: ____________ °Date: ____________ Movements: ____________ Start time: ____________ Finish time: ____________ °Date: ____________ Movements: ____________ Start time: ____________ Finish time: ____________ °Date: ____________ Movements: ____________ Start time: ____________ Finish time: ____________  °Date: ____________ Movements: ____________ Start time: ____________ Finish time: ____________ °Date: ____________ Movements: ____________ Start time: ____________ Finish time: ____________ °Date: ____________ Movements: ____________ Start time: ____________ Finish time: ____________ °Date: ____________ Movements: ____________ Start time: ____________ Finish time: ____________ °Date: ____________ Movements: ____________ Start time: ____________ Finish time: ____________ °Date: ____________ Movements: ____________ Start time: ____________ Finish time: ____________ °Date: ____________ Movements: ____________ Start time: ____________ Finish time: ____________  °Date: ____________ Movements: ____________ Start time: ____________ Finish time: ____________ °Date: ____________ Movements: ____________ Start time: ____________ Finish time: ____________ °Date: ____________ Movements: ____________ Start time: ____________ Finish  time: ____________ °Date: ____________ Movements: ____________ Start time: ____________ Finish time: ____________ °Date: ____________ Movements: ____________ Start time: ____________ Finish time: ____________ °Date: ____________ Movements: ____________ Start time: ____________ Finish time: ____________ °Date: ____________ Movements: ____________ Start time: ____________ Finish time: ____________  °Date: ____________ Movements: ____________ Start time: ____________ Finish time: ____________ °Date: ____________ Movements: ____________ Start time: ____________ Finish time: ____________ °Date: ____________ Movements: ____________ Start time: ____________ Finish time: ____________ °Date: ____________ Movements: ____________ Start time: ____________ Finish time: ____________ °Date: ____________ Movements: ____________ Start time: ____________ Finish time: ____________ °Date: ____________ Movements: ____________ Start time: ____________ Finish time: ____________ °Document Released: 03/24/2006 Document Revised: 11/04/2010 Document Reviewed: 09/24/2008 °[employer]® Patient Information ©2012 [employer]. °

## 2011-02-02 ENCOUNTER — Telehealth: Payer: Self-pay | Admitting: *Deleted

## 2011-02-02 ENCOUNTER — Other Ambulatory Visit: Payer: Self-pay | Admitting: *Deleted

## 2011-02-02 DIAGNOSIS — O0990 Supervision of high risk pregnancy, unspecified, unspecified trimester: Secondary | ICD-10-CM

## 2011-02-02 NOTE — Progress Notes (Signed)
Addended by: Faythe Casa on: 02/02/2011 11:15 AM   Modules accepted: Orders

## 2011-02-02 NOTE — Telephone Encounter (Signed)
Order for BPP

## 2011-02-04 ENCOUNTER — Ambulatory Visit (HOSPITAL_COMMUNITY)
Admission: RE | Admit: 2011-02-04 | Discharge: 2011-02-04 | Disposition: A | Discharge status: Home / Self Care | Payer: Medicaid Other | Source: Ambulatory Visit | Attending: Family Medicine | Admitting: Family Medicine

## 2011-02-04 DIAGNOSIS — O09299 Supervision of pregnancy with other poor reproductive or obstetric history, unspecified trimester: Secondary | ICD-10-CM | POA: Insufficient documentation

## 2011-02-04 DIAGNOSIS — O0990 Supervision of high risk pregnancy, unspecified, unspecified trimester: Secondary | ICD-10-CM

## 2011-02-04 DIAGNOSIS — Z3689 Encounter for other specified antenatal screening: Secondary | ICD-10-CM | POA: Insufficient documentation

## 2011-02-19 ENCOUNTER — Ambulatory Visit: Payer: Medicaid Other | Admitting: Physician Assistant

## 2011-02-22 ENCOUNTER — Ambulatory Visit (INDEPENDENT_AMBULATORY_CARE_PROVIDER_SITE_OTHER): Payer: Medicaid Other | Admitting: Obstetrics & Gynecology

## 2011-02-22 DIAGNOSIS — L309 Dermatitis, unspecified: Secondary | ICD-10-CM

## 2011-02-22 DIAGNOSIS — O09299 Supervision of pregnancy with other poor reproductive or obstetric history, unspecified trimester: Secondary | ICD-10-CM

## 2011-02-22 DIAGNOSIS — L259 Unspecified contact dermatitis, unspecified cause: Secondary | ICD-10-CM

## 2011-02-22 DIAGNOSIS — B951 Streptococcus, group B, as the cause of diseases classified elsewhere: Secondary | ICD-10-CM

## 2011-02-22 DIAGNOSIS — D573 Sickle-cell trait: Secondary | ICD-10-CM

## 2011-02-22 DIAGNOSIS — O2390 Unspecified genitourinary tract infection in pregnancy, unspecified trimester: Secondary | ICD-10-CM

## 2011-02-22 DIAGNOSIS — N390 Urinary tract infection, site not specified: Secondary | ICD-10-CM

## 2011-02-22 LAB — POCT URINALYSIS DIP (DEVICE)
Glucose, UA: NEGATIVE mg/dL
Hgb urine dipstick: NEGATIVE
Specific Gravity, Urine: 1.02 (ref 1.005–1.030)
Urobilinogen, UA: 0.2 mg/dL (ref 0.0–1.0)
pH: 6.5 (ref 5.0–8.0)

## 2011-02-22 MED ORDER — CLOBETASOL PROPIONATE 0.05 % EX OINT: TOPICAL_OINTMENT | Freq: Two times a day (BID) | CUTANEOUS | Status: DC

## 2011-02-22 NOTE — Progress Notes (Signed)
[place] growth and BPP on 02/25/11 @ 1400

## 2011-02-22 NOTE — Patient Instructions (Signed)
Breastfeeding BENEFITS OF BREASTFEEDING For the baby  The first milk (colostrum) helps the baby's digestive system function better.   There are antibodies from the mother in the milk that help the baby fight off infections.   The baby has a lower incidence of asthma, allergies, and SIDS (sudden infant death syndrome).   The nutrients in breast milk are better than formulas for the baby and helps the baby's brain grow better.   Babies who breastfeed have less gas, colic, and constipation.  For the mother  Breastfeeding helps develop a very special bond between mother and baby.   It is more convenient, always available at the correct temperature and cheaper than formula feeding.   It burns calories in the mother and helps with losing weight that was gained during pregnancy.   It makes the uterus contract back down to normal size faster and slows bleeding following delivery.   Breastfeeding mothers have a lower risk of developing breast cancer.  NURSE FREQUENTLY  A healthy, full-term baby may breastfeed as often as every hour or space his or her feedings to every 3 hours.   How often to nurse will vary from baby to baby. Watch your baby for signs of hunger, not the clock.   Nurse as often as the baby requests, or when you feel the need to reduce the fullness of your breasts.   Awaken the baby if it has been 3 to 4 hours since the last feeding.   Frequent feeding will help the mother make more milk and will prevent problems like sore nipples and engorgement of the breasts.  BABY'S POSITION AT THE BREAST  Whether lying down or sitting, be sure that the baby's tummy is facing your tummy.   Support the breast with 4 fingers underneath the breast and the thumb above. Make sure your fingers are well away from the nipple and baby's mouth.   Stroke the baby's lips and cheek closest to the breast gently with your finger or nipple.   When the baby's mouth is open wide enough, place  all of your nipple and as much of the dark area around the nipple as possible into your baby's mouth.   Pull the baby in close so the tip of the nose and the baby's cheeks touch the breast during the feeding.  FEEDINGS  The length of each feeding varies from baby to baby and from feeding to feeding.   The baby must suck about 2 to 3 minutes for your milk to get to him or her. This is called a "let down." For this reason, allow the baby to feed on each breast as long as he or she wants. Your baby will end the feeding when he or she has received the right balance of nutrients.   To break the suction, put your finger into the corner of the baby's mouth and slide it between his or her gums before removing your breast from his or her mouth. This will help prevent sore nipples.  REDUCING BREAST ENGORGEMENT  In the first week after your baby is born, you may experience signs of breast engorgement. When breasts are engorged, they feel heavy, warm, full, and may be tender to the touch. You can reduce engorgement if you:   Nurse frequently, every 2 to 3 hours. Mothers who breastfeed early and often have fewer problems with engorgement.   Place light ice packs on your breasts between feedings. This reduces swelling. Wrap the ice packs in a   lightweight towel to protect your skin.   Apply moist hot packs to your breast for 5 to 10 minutes before each feeding. This increases circulation and helps the milk flow.   Gently massage your breast before and during the feeding.   Make sure that the baby empties at least one breast at every feeding before switching sides.   Use a breast pump to empty the breasts if your baby is sleepy or not nursing well. You may also want to pump if you are returning to work or or you feel you are getting engorged.   Avoid bottle feeds, pacifiers or supplemental feedings of water or juice in place of breastfeeding.   Be sure the baby is latched on and positioned properly while  breastfeeding.   Prevent fatigue, stress, and anemia.   Wear a supportive bra, avoiding underwire styles.   Eat a balanced diet with enough fluids.  If you follow these suggestions, your engorgement should improve in 24 to 48 hours. If you are still experiencing difficulty, call your lactation consultant or caregiver. IS MY BABY GETTING ENOUGH MILK? Sometimes, mothers worry about whether their babies are getting enough milk. You can be assured that your baby is getting enough milk if:  The baby is actively sucking and you hear swallowing.   The baby nurses at least 8 to 12 times in a 24 hour time period. Nurse your baby until he or she unlatches or falls asleep at the first breast (at least 10 to 20 minutes), then offer the second side.   The baby is wetting 5 to 6 disposable diapers (6 to 8 cloth diapers) in a 24 hour period by 5 to 6 days of age.   The baby is having at least 2 to 3 stools every 24 hours for the first few months. Breast milk is all the food your baby needs. It is not necessary for your baby to have water or formula. In fact, to help your breasts make more milk, it is best not to give your baby supplemental feedings during the early weeks.   The stool should be soft and yellow.   The baby should gain 4 to 7 ounces per week after he is 4 days old.  TAKE CARE OF YOURSELF Take care of your breasts by:  Bathing or showering daily.   Avoiding the use of soaps on your nipples.   Start feedings on your left breast at one feeding and on your right breast at the next feeding.   You will notice an increase in your milk supply 2 to 5 days after delivery. You may feel some discomfort from engorgement, which makes your breasts very firm and often tender. Engorgement "peaks" out within 24 to 48 hours. In the meantime, apply warm moist towels to your breasts for 5 to 10 minutes before feeding. Gentle massage and expression of some milk before feeding will soften your breasts, making  it easier for your baby to latch on. Wear a well fitting nursing bra and air dry your nipples for 10 to 15 minutes after each feeding.   Only use cotton bra pads.   Only use pure lanolin on your nipples after nursing. You do not need to wash it off before nursing.  Take care of yourself by:   Eating well-balanced meals and nutritious snacks.   Drinking milk, fruit juice, and water to satisfy your thirst (about 8 glasses a day).   Getting plenty of rest.   Increasing calcium in   your diet (1200 mg a day).   Avoiding foods that you notice affect the baby in a bad way.  SEEK MEDICAL CARE IF:   You have any questions or difficulty with breastfeeding.   You need help.   You have a hard, red, sore area on your breast, accompanied by a fever of 100.5 F (38.1 C) or more.   Your baby is too sleepy to eat well or is having trouble sleeping.   Your baby is wetting less than 6 diapers per day, by 5 days of age.   Your baby's skin or white part of his or her eyes is more yellow than it was in the hospital.   You feel depressed.  Document Released: 02/22/2005 Document Revised: 11/04/2010 Document Reviewed: 10/07/2008 [employer] Patient Information 2012 [employer]. 

## 2011-02-22 NOTE — Progress Notes (Signed)
Has a lot of clear white discharge.

## 2011-02-22 NOTE — Progress Notes (Signed)
Will start 2x/week testing, will also get growth scan/BPP this week.  Patient complains of worsening eczema in bilateral antecubital area.  Will prescribe steroid ointment. No other complaints or concerns.  Fetal movement and labor precautions reviewed.

## 2011-02-25 ENCOUNTER — Ambulatory Visit (HOSPITAL_COMMUNITY)
Admission: RE | Admit: 2011-02-25 | Discharge: 2011-02-25 | Disposition: A | Discharge status: Home / Self Care | Payer: Medicaid Other | Source: Ambulatory Visit | Attending: Obstetrics & Gynecology | Admitting: Obstetrics & Gynecology

## 2011-02-25 ENCOUNTER — Ambulatory Visit (INDEPENDENT_AMBULATORY_CARE_PROVIDER_SITE_OTHER): Payer: Medicaid Other | Admitting: *Deleted

## 2011-02-25 DIAGNOSIS — O09299 Supervision of pregnancy with other poor reproductive or obstetric history, unspecified trimester: Secondary | ICD-10-CM

## 2011-02-25 DIAGNOSIS — Z3689 Encounter for other specified antenatal screening: Secondary | ICD-10-CM | POA: Insufficient documentation

## 2011-02-25 NOTE — Progress Notes (Signed)
P = 80   NST only today.  [place] BPP done in [place] de[place] today.

## 2011-03-04 ENCOUNTER — Ambulatory Visit (INDEPENDENT_AMBULATORY_CARE_PROVIDER_SITE_OTHER): Payer: Medicaid Other | Admitting: *Deleted

## 2011-03-04 VITALS — BP 114/71

## 2011-03-04 DIAGNOSIS — O09299 Supervision of pregnancy with other poor reproductive or obstetric history, unspecified trimester: Secondary | ICD-10-CM

## 2011-03-04 NOTE — Progress Notes (Signed)
P = 87 

## 2011-03-05 NOTE — Progress Notes (Signed)
NST performed on 03/05/2011 was reviewed and was found to be reactive.  Continue recommended antenatal testing and prenatal care.

## 2011-03-08 ENCOUNTER — Ambulatory Visit (INDEPENDENT_AMBULATORY_CARE_PROVIDER_SITE_OTHER): Payer: Medicaid Other | Admitting: Advanced Practice Midwife

## 2011-03-08 VITALS — BP 116/71 | Temp 96.4°F | Wt 170.1 lb

## 2011-03-08 DIAGNOSIS — N390 Urinary tract infection, site not specified: Secondary | ICD-10-CM

## 2011-03-08 DIAGNOSIS — B951 Streptococcus, group B, as the cause of diseases classified elsewhere: Secondary | ICD-10-CM

## 2011-03-08 DIAGNOSIS — O0990 Supervision of high risk pregnancy, unspecified, unspecified trimester: Secondary | ICD-10-CM | POA: Insufficient documentation

## 2011-03-08 DIAGNOSIS — O2390 Unspecified genitourinary tract infection in pregnancy, unspecified trimester: Secondary | ICD-10-CM

## 2011-03-08 DIAGNOSIS — O09299 Supervision of pregnancy with other poor reproductive or obstetric history, unspecified trimester: Secondary | ICD-10-CM

## 2011-03-08 DIAGNOSIS — D649 Anemia, unspecified: Secondary | ICD-10-CM

## 2011-03-08 LAB — POCT URINALYSIS DIP (DEVICE)
Bilirubin Urine: NEGATIVE
Hgb urine dipstick: NEGATIVE
Ketones, ur: NEGATIVE mg/dL
Nitrite: NEGATIVE
Protein, ur: NEGATIVE mg/dL
pH: 6.5 (ref 5.0–8.0)

## 2011-03-08 NOTE — Progress Notes (Signed)
Pulse- 89.  Pain- with contractions

## 2011-03-08 NOTE — Patient Instructions (Signed)
Pregnancy - Third Trimester The third trimester of pregnancy (the last 3 months) is a period of the most rapid growth for you and your baby. The baby approaches a length of 20 inches and a weight of 6 to 10 pounds. The baby is adding on fat and getting ready for life outside your body. While inside, babies have periods of sleeping and waking, suck their thumbs, and hiccups. You can often feel small contractions of the uterus. This is false labor. It is also called Braxton-Hicks contractions. This is like a practice for labor. The usual problems in this stage of pregnancy include more difficulty breathing, swelling of the hands and feet from water retention, and having to urinate more often because of the uterus and baby pressing on your bladder.  PRENATAL EXAMS  Blood work may continue to be done during prenatal exams. These tests are done to check on your health and the probable health of your baby. Blood work is used to follow your blood levels (hemoglobin). Anemia (low hemoglobin) is common during pregnancy. Iron and vitamins are given to help prevent this. You may also continue to be checked for diabetes. Some of the past blood tests may be done again.   The size of the uterus is measured during each visit. This makes sure your baby is growing properly according to your pregnancy dates.   Your blood pressure is checked every prenatal visit. This is to make sure you are not getting toxemia.   Your urine is checked every prenatal visit for infection, diabetes and protein.   Your weight is checked at each visit. This is done to make sure gains are happening at the suggested rate and that you and your baby are growing normally.   Sometimes, an ultrasound is performed to confirm the position and the proper growth and development of the baby. This is a test done that bounces harmless sound waves off the baby so your caregiver can more accurately determine due dates.   Discuss the type of pain  medication and anesthesia you will have during your labor and delivery.   Discuss the possibility and anesthesia if a Cesarean Section might be necessary.   Inform your caregiver if there is any mental or physical violence at home.  Sometimes, a specialized non-stress test, contraction stress test and biophysical profile are done to make sure the baby is not having a problem. Checking the amniotic fluid surrounding the baby is called an amniocentesis. The amniotic fluid is removed by sticking a needle into the belly (abdomen). This is sometimes done near the end of pregnancy if an early delivery is required. In this case, it is done to help make sure the baby's lungs are mature enough for the baby to live outside of the womb. If the lungs are not mature and it is unsafe to deliver the baby, an injection of cortisone medication is given to the mother 1 to 2 days before the delivery. This helps the baby's lungs mature and makes it safer to deliver the baby. CHANGES OCCURING IN THE THIRD TRIMESTER OF PREGNANCY Your body goes through many changes during pregnancy. They vary from person to person. Talk to your caregiver about changes you notice and are concerned about.  During the last trimester, you have probably had an increase in your appetite. It is normal to have cravings for certain foods. This varies from person to person and pregnancy to pregnancy.   You may begin to get stretch marks on your hips,   abdomen, and breasts. These are normal changes in the body during pregnancy. There are no exercises or medications to take which prevent this change.   Constipation may be treated with a stool softener or adding bulk to your diet. Drinking lots of fluids, fiber in vegetables, fruits, and whole grains are helpful.   Exercising is also helpful. If you have been very active up until your pregnancy, most of these activities can be continued during your pregnancy. If you have been less active, it is helpful  to start an exercise program such as walking. Consult your caregiver before starting exercise programs.   Avoid all smoking, alcohol, un-prescribed drugs, herbs and "street drugs" during your pregnancy. These chemicals affect the formation and growth of the baby. Avoid chemicals throughout the pregnancy to ensure the delivery of a healthy infant.   Backache, varicose veins and hemorrhoids may develop or get worse.   You will tire more easily in the third trimester, which is normal.   The baby's movements may be stronger and more often.   You may become short of breath easily.   Your belly button may stick out.   A yellow discharge may leak from your breasts called colostrum.   You may have a bloody mucus discharge. This usually occurs a few days to a week before labor begins.  HOME CARE INSTRUCTIONS   Keep your caregiver's appointments. Follow your caregiver's instructions regarding medication use, exercise, and diet.   During pregnancy, you are providing food for you and your baby. Continue to eat regular, well-balanced meals. Choose foods such as meat, fish, milk and other low fat dairy products, vegetables, fruits, and whole-grain breads and cereals. Your caregiver will tell you of the ideal weight gain.   A physical sexual relationship may be continued throughout pregnancy if there are no other problems such as early (premature) leaking of amniotic fluid from the membranes, vaginal bleeding, or belly (abdominal) pain.   Exercise regularly if there are no restrictions. Check with your caregiver if you are unsure of the safety of your exercises. Greater weight gain will occur in the last 2 trimesters of pregnancy. Exercising helps:   Control your weight.   Get you in shape for labor and delivery.   You lose weight after you deliver.   Rest a lot with legs elevated, or as needed for leg cramps or low back pain.   Wear a good support or jogging bra for breast tenderness during  pregnancy. This may help if worn during sleep. Pads or tissues may be used in the bra if you are leaking colostrum.   Do not use hot tubs, steam rooms, or saunas.   Wear your seat belt when driving. This protects you and your baby if you are in an accident.   Avoid raw meat, cat litter boxes and soil used by cats. These carry germs that can cause birth defects in the baby.   It is easier to loose urine during pregnancy. Tightening up and strengthening the pelvic muscles will help with this problem. You can practice stopping your urination while you are going to the bathroom. These are the same muscles you need to strengthen. It is also the muscles you would use if you were trying to stop from passing gas. You can practice tightening these muscles up 10 times a set and repeating this about 3 times per day. Once you know what muscles to tighten up, do not perform these exercises during urination. It is more likely   to cause an infection by backing up the urine.   Ask for help if you have financial, counseling or nutritional needs during pregnancy. Your caregiver will be able to offer counseling for these needs as well as refer you for other special needs.   Make a list of emergency phone numbers and have them available.   Plan on getting help from family or friends when you go home from the hospital.   Make a trial run to the hospital.   Take prenatal classes with the father to understand, practice and ask questions about the labor and delivery.   Prepare the baby's room/nursery.   Do not travel out of the city unless it is absolutely necessary and with the advice of your caregiver.   Wear only low or no heal shoes to have better balance and prevent falling.  MEDICATIONS AND DRUG USE IN PREGNANCY  Take prenatal vitamins as directed. The vitamin should contain 1 milligram of folic acid. Keep all vitamins out of reach of children. Only a couple vitamins or tablets containing iron may be fatal  to a baby or young child when ingested.   Avoid use of all medications, including herbs, over-the-counter medications, not prescribed or suggested by your caregiver. Only take over-the-counter or prescription medicines for pain, discomfort, or fever as directed by your caregiver. Do not use aspirin, ibuprofen (Motrin, Advil, Nuprin) or naproxen (Aleve) unless OK'd by your caregiver.   Let your caregiver also know about herbs you may be using.   Alcohol is related to a number of birth defects. This includes fetal alcohol syndrome. All alcohol, in any form, should be avoided completely. Smoking will cause low birth rate and premature babies.   Street/illegal drugs are very harmful to the baby. They are absolutely forbidden. A baby born to an addicted mother will be addicted at birth. The baby will go through the same withdrawal an adult does.  SEEK MEDICAL CARE IF: You have any concerns or worries during your pregnancy. It is better to call with your questions if you feel they cannot wait, rather than worry about them. DECISIONS ABOUT CIRCUMCISION You may or may not know the sex of your baby. If you know your baby is a boy, it may be time to think about circumcision. Circumcision is the removal of the foreskin of the penis. This is the skin that covers the sensitive end of the penis. There is no proven medical need for this. Often this decision is made on what is popular at the time or based upon religious beliefs and social issues. You can discuss these issues with your caregiver or pediatrician. SEEK IMMEDIATE MEDICAL CARE IF:   An unexplained oral temperature above 102 F (38.9 C) develops, or as your caregiver suggests.   You have leaking of fluid from the vagina (birth canal). If leaking membranes are suspected, take your temperature and tell your caregiver of this when you call.   There is vaginal spotting, bleeding or passing clots. Tell your caregiver of the amount and how many pads are  used.   You develop a bad smelling vaginal discharge with a change in the color from clear to white.   You develop vomiting that lasts more than 24 hours.   You develop chills or fever.   You develop shortness of breath.   You develop burning on urination.   You loose more than 2 pounds of weight or gain more than 2 pounds of weight or as suggested by your   caregiver.   You notice sudden swelling of your face, hands, and feet or legs.   You develop belly (abdominal) pain. Round ligament discomfort is a common non-cancerous (benign) cause of abdominal pain in pregnancy. Your caregiver still must evaluate you.   You develop a severe headache that does not go away.   You develop visual problems, blurred or double vision.   If you have not felt your baby move for more than 1 hour. If you think the baby is not moving as much as usual, eat something with sugar in it and lie down on your left side for an hour. The baby should move at least 4 to 5 times per hour. Call right away if your baby moves less than that.   You fall, are in a car accident or any kind of trauma.   There is mental or physical violence at home.  Document Released: 02/16/2001 Document Revised: 11/04/2010 Document Reviewed: 08/21/2008 [employer] Patient Information 2012 [employer]. 

## 2011-03-08 NOTE — Progress Notes (Signed)
NST today. Will schedule [place] for next week with [facility], Good FM, No leaking or bleeding. GBS done (gbs on problem list was last preg), cultures on urine.

## 2011-03-09 LAB — GC/CHLAMYDIA PROBE AMP, URINE: GC Probe Amp, Urine: NEGATIVE

## 2011-03-09 NOTE — L&D Delivery Note (Signed)
Delivery Note At 1:58 PM a viable female was delivered via  (Presentation: LOA).  APGAR: 9, 9; weight 7 pounds 10 ounces.   Placenta status: Intact, Spontaneous.  Cord: 3 vessel, with the following complications: 30 second shoulder dystocia that resolved with McRoberts and removal of posterior shoulder.   Anesthesia: Epidural  Episiotomy: none Lacerations: none Est. Blood Loss (mL): 500 mL  Mom to postpartum.  Baby to nursery-stable.  Kemo Spruce JEHIEL 04/02/2011, 2:21 PM

## 2011-03-11 ENCOUNTER — Other Ambulatory Visit: Payer: Self-pay | Admitting: Obstetrics & Gynecology

## 2011-03-11 ENCOUNTER — Ambulatory Visit (INDEPENDENT_AMBULATORY_CARE_PROVIDER_SITE_OTHER): Payer: Medicaid Other | Admitting: *Deleted

## 2011-03-11 VITALS — BP 121/61

## 2011-03-11 DIAGNOSIS — O09299 Supervision of pregnancy with other poor reproductive or obstetric history, unspecified trimester: Secondary | ICD-10-CM

## 2011-03-11 NOTE — Progress Notes (Signed)
NST performed on 03/11/2011 was reviewed and was found to be reactive.  Continue recommended antenatal testing and prenatal care.

## 2011-03-11 NOTE — Progress Notes (Signed)
P = 96   NST only today

## 2011-03-12 NOTE — Progress Notes (Signed)
NST from 03/10/10 is reactive

## 2011-03-15 ENCOUNTER — Other Ambulatory Visit: Payer: Self-pay

## 2011-03-15 ENCOUNTER — Ambulatory Visit (HOSPITAL_COMMUNITY)
Admission: RE | Admit: 2011-03-15 | Discharge: 2011-03-15 | Disposition: A | Discharge status: Home / Self Care | Payer: Medicaid Other | Source: Ambulatory Visit | Attending: Advanced Practice Midwife | Admitting: Advanced Practice Midwife

## 2011-03-15 DIAGNOSIS — D573 Sickle-cell trait: Secondary | ICD-10-CM | POA: Insufficient documentation

## 2011-03-15 DIAGNOSIS — O99019 Anemia complicating pregnancy, unspecified trimester: Secondary | ICD-10-CM | POA: Insufficient documentation

## 2011-03-15 DIAGNOSIS — O0990 Supervision of high risk pregnancy, unspecified, unspecified trimester: Secondary | ICD-10-CM

## 2011-03-15 DIAGNOSIS — O09299 Supervision of pregnancy with other poor reproductive or obstetric history, unspecified trimester: Secondary | ICD-10-CM | POA: Insufficient documentation

## 2011-03-18 ENCOUNTER — Ambulatory Visit (INDEPENDENT_AMBULATORY_CARE_PROVIDER_SITE_OTHER): Payer: Medicaid Other | Admitting: *Deleted

## 2011-03-18 VITALS — BP 121/67 | Wt 171.9 lb

## 2011-03-18 DIAGNOSIS — O09299 Supervision of pregnancy with other poor reproductive or obstetric history, unspecified trimester: Secondary | ICD-10-CM

## 2011-03-18 NOTE — Progress Notes (Signed)
P = 91 

## 2011-03-22 ENCOUNTER — Ambulatory Visit (INDEPENDENT_AMBULATORY_CARE_PROVIDER_SITE_OTHER): Payer: Medicaid Other | Admitting: Obstetrics and Gynecology

## 2011-03-22 ENCOUNTER — Telehealth (HOSPITAL_COMMUNITY): Payer: Self-pay | Admitting: *Deleted

## 2011-03-22 ENCOUNTER — Encounter (HOSPITAL_COMMUNITY): Payer: Self-pay | Admitting: *Deleted

## 2011-03-22 DIAGNOSIS — D573 Sickle-cell trait: Secondary | ICD-10-CM

## 2011-03-22 DIAGNOSIS — O0990 Supervision of high risk pregnancy, unspecified, unspecified trimester: Secondary | ICD-10-CM

## 2011-03-22 DIAGNOSIS — O09299 Supervision of pregnancy with other poor reproductive or obstetric history, unspecified trimester: Secondary | ICD-10-CM

## 2011-03-22 DIAGNOSIS — O99019 Anemia complicating pregnancy, unspecified trimester: Secondary | ICD-10-CM

## 2011-03-22 LAB — POCT URINALYSIS DIP (DEVICE)
Glucose, UA: NEGATIVE mg/dL
Hgb urine dipstick: NEGATIVE
Ketones, ur: NEGATIVE mg/dL
Specific Gravity, Urine: 1.02 (ref 1.005–1.030)

## 2011-03-22 NOTE — Progress Notes (Signed)
Swelling in hands and feet. Sharps pains in vaginal area. No vaginal discharge. Pulse 83.

## 2011-03-22 NOTE — Telephone Encounter (Signed)
Preadmission screen  

## 2011-03-22 NOTE — Progress Notes (Signed)
IOL scheduled 04/01/11 at 730 pm.

## 2011-03-22 NOTE — Patient Instructions (Addendum)
Labor Induction A pregnant woman usually goes into labor spontaneously before the birth of her baby. Most babies are born between 37 and 42 weeks of the pregnancy. When this does not happen, caregivers may use medication or other methods to bring on (induce) labor. Labor induction causes a pregnant woman's uterus to contract, the cervix to open (dilate) and thin out (efface) to prepare for the vaginal birth of her baby. Several methods of labor induction may be used such as:  Massaging the nipple and areola of the breasts (nipple stimulation).   Prostaglandin medication used orally or as a vaginal cream.   Striping of membranes (your caregiver inserts a finger between the cervix and membranes around the baby's head) causes the body to produce prostaglandins that soften the cervix and cause the uterus to contract.   Rupture of the water bag (amniotomy).   Oxytocin by IV.   Special dilators placed into the cervical canal that causes the cervix to soften and open.   Mechanical devices to stretch open the cervix such as, a dilated foley catheter.  Whether your labor will be induced depends on the condition of you and your baby, how far along you are, are the baby's lung maturity, the condition of the cervix, the way the baby is lying, and other factors. Usually, labor is not induced before 39 weeks of the pregnancy unless there is a problem with the baby or mother, and it becomes necessary to induce labor. REASONS LABOR SHOULD BE INDUCED  The health of the baby or mother has become at risk.   The pregnancy is overdue by 2 weeks or more.   Your water breaks (premature rupture of membranes), the baby's lungs are mature, and labor does not start on its own.   You develop high blood pressure (toxemia of pregnancy).   You develop an infection in your uterus.   You have diabetes or other serious medical illness.   Amniotic fluid amounts are small around the baby.   Your placenta begins to  separate from the inner wall of the uterus before the baby is born (placental abruption). This condition may cause you to have an emergency Cesarean delivery.   You have fetal death.   A social induction is also known as an induction for convenience. Most of the time, labor is induced for sound medical reasons. Sometimes, it is done as a convenience. Living a long way from the hospital or having a history of very rapid labors may be reasons the mother may want to induce delivery.  REASONS LABOR SHOULD NOT BE INDUCED  You have had previous surgeries on your uterus. This is especially true if the surgeries went into the inside lining and cavity of the uterus. This gives an added risk for rupturing the uterus.   You have placenta previa. This means your placenta lies very low in the uterus and blocks the opening (cervix) for the baby to get out.   Your baby is not in a head down position. For example, if your baby lies across your uterus (transverse) instead of head first.   If the umbilical cord drops down into the birth canal in front of your baby. This could cut off the baby's blood supply and oxygen to the baby.  RISKS AND COMPLICATIONS Problems seldom occur with labor induction, but there can be some complications. Some of the risks of induction include:  Change in fetal heart rate (too high, too low or irradic).   Increased risk of   a premature baby, even if you think your baby is term.   Increased risk of fetal distress. This means your baby gets into problems during induction. This can be caused by the umbilical cord coming out in front of the baby or is being squeezed.   Increased risk of infection to mother and baby.   Increased chance of having a Cesarean delivery. This is an operation on your belly (abdomen) to remove the baby.   Strong contractions can lead to abruption. This is a separating of the placenta from the uterus.   Uterine rupture, especially if you had a previous  Cesarean or surgery on your uterus.  When labor is induced because of medical problems, other risks may be present. Induced labor may lead to:  Increased use of medications for pain relief.   Other interventions.  When induction is needed for medical reasons, the benefits of induction may outweigh the risks. PROCEDURE It can sometimes take up to 2 or 3 days to induce labor. It usually takes less time. It takes longer when you are induced early in the pregnancy and for first pregnancies.  Before coming to the hospital for an induction:  Do not eat much before you come to the hospital (for at least 8 hours).   Do not eat after midnight if you are going to be induced the next morning.   Be aware that medications for labor induction can upset your stomach.   Let your caregiver know if you need medications for pain.  HOME CARE INSTRUCTIONS If you have been induced in your caregiver's office to start labor, and are allowed to go home, follow the instructions given to you by your care giver. SEEK IMMEDIATE MEDICAL CARE IF:  You develop any kind of vaginal bleeding.   You develop contractions that are severe and continuous.   You feel faint or feel light headed.   You do not develop contractions within the time your caregiver suggests you should.   You begin to run a temperature of 100 F (37.8 C) or develop chills.   You no longer feel the normal fetal movement.  Document Released: 07/14/2006 Document Revised: 11/04/2010 Document Reviewed: 11/01/2008 [facility] Patient Information 2012 [place], [place].  Breastfeeding BENEFITS OF BREASTFEEDING For the baby  The first milk (colostrum) helps the baby's digestive system function better.   There are antibodies from the mother in the milk that help the baby fight off infections.   The baby has a lower incidence of asthma, allergies, and SIDS (sudden infant death syndrome).   The nutrients in breast milk are better than formulas for  the baby and helps the baby's brain grow better.   Babies who breastfeed have less gas, colic, and constipation.  For the mother  Breastfeeding helps develop a very special bond between mother and baby.   It is more convenient, always available at the correct temperature and cheaper than formula feeding.   It burns calories in the mother and helps with losing weight that was gained during pregnancy.   It makes the uterus contract back down to normal size faster and slows bleeding following delivery.   Breastfeeding mothers have a lower risk of developing breast cancer.  NURSE FREQUENTLY  A healthy, full-term baby may breastfeed as often as every hour or space his or her feedings to every 3 hours.   How often to nurse will vary from baby to baby. Watch your baby for signs of hunger, not the clock.   Nurse  as often as the baby requests, or when you feel the need to reduce the fullness of your breasts.   Awaken the baby if it has been 3 to 4 hours since the last feeding.   Frequent feeding will help the mother make more milk and will prevent problems like sore nipples and engorgement of the breasts.  BABY'S POSITION AT THE BREAST  Whether lying down or sitting, be sure that the baby's tummy is facing your tummy.   Support the breast with 4 fingers underneath the breast and the thumb above. Make sure your fingers are well away from the nipple and baby's mouth.   Stroke the baby's lips and cheek closest to the breast gently with your finger or nipple.   When the baby's mouth is open wide enough, place all of your nipple and as much of the dark area around the nipple as possible into your baby's mouth.   Pull the baby in close so the tip of the nose and the baby's cheeks touch the breast during the feeding.  FEEDINGS  The length of each feeding varies from baby to baby and from feeding to feeding.   The baby must suck about 2 to 3 minutes for your milk to get to him or her. This is  called a "let down." For this reason, allow the baby to feed on each breast as long as he or she wants. Your baby will end the feeding when he or she has received the right balance of nutrients.   To break the suction, put your finger into the corner of the baby's mouth and slide it between his or her gums before removing your breast from his or her mouth. This will help prevent sore nipples.  REDUCING BREAST ENGORGEMENT  In the first week after your baby is born, you may experience signs of breast engorgement. When breasts are engorged, they feel heavy, warm, full, and may be tender to the touch. You can reduce engorgement if you:   Nurse frequently, every 2 to 3 hours. Mothers who breastfeed early and often have fewer problems with engorgement.   Place light ice packs on your breasts between feedings. This reduces swelling. Wrap the ice packs in a lightweight towel to protect your skin.   Apply moist hot packs to your breast for 5 to 10 minutes before each feeding. This increases circulation and helps the milk flow.   Gently massage your breast before and during the feeding.   Make sure that the baby empties at least one breast at every feeding before switching sides.   Use a breast pump to empty the breasts if your baby is sleepy or not nursing well. You may also want to pump if you are returning to work or or you feel you are getting engorged.   Avoid bottle feeds, pacifiers or supplemental feedings of water or juice in place of breastfeeding.   Be sure the baby is latched on and positioned properly while breastfeeding.   Prevent fatigue, stress, and anemia.   Wear a supportive bra, avoiding underwire styles.   Eat a balanced diet with enough fluids.  If you follow these suggestions, your engorgement should improve in 24 to 48 hours. If you are still experiencing difficulty, call your lactation consultant or caregiver. IS MY BABY GETTING ENOUGH MILK? Sometimes, mothers worry about  whether their babies are getting enough milk. You can be assured that your baby is getting enough milk if:  The baby is actively  sucking and you hear swallowing.   The baby nurses at least 8 to 12 times in a 24 hour time period. Nurse your baby until he or she unlatches or falls asleep at the first breast (at least 10 to 20 minutes), then offer the second side.   The baby is wetting 5 to 6 disposable diapers (6 to 8 cloth diapers) in a 24 hour period by 41 to 75 days of age.   The baby is having at least 2 to 3 stools every 24 hours for the first few months. Breast milk is all the food your baby needs. It is not necessary for your baby to have water or formula. In fact, to help your breasts make more milk, it is best not to give your baby supplemental feedings during the early weeks.   The stool should be soft and yellow.   The baby should gain 4 to 7 ounces per week after he is 68 days old.  TAKE CARE OF YOURSELF Take care of your breasts by:  Bathing or showering daily.   Avoiding the use of soaps on your nipples.   Start feedings on your left breast at one feeding and on your right breast at the next feeding.   You will notice an increase in your milk supply 2 to 5 days after delivery. You may feel some discomfort from engorgement, which makes your breasts very firm and often tender. Engorgement "peaks" out within 24 to 48 hours. In the meantime, apply warm moist towels to your breasts for 5 to 10 minutes before feeding. Gentle massage and expression of some milk before feeding will soften your breasts, making it easier for your baby to latch on. Wear a well fitting nursing bra and air dry your nipples for 10 to 15 minutes after each feeding.   Only use cotton bra pads.   Only use pure lanolin on your nipples after nursing. You do not need to wash it off before nursing.  Take care of yourself by:   Eating well-balanced meals and nutritious snacks.   Drinking milk, fruit juice, and  water to satisfy your thirst (about 8 glasses a day).   Getting plenty of rest.   Increasing calcium in your diet (1200 mg a day).   Avoiding foods that you notice affect the baby in a bad way.  SEEK MEDICAL CARE IF:   You have any questions or difficulty with breastfeeding.   You need help.   You have a hard, red, sore area on your breast, accompanied by a fever of 100.5 F (38.1 C) or more.   Your baby is too sleepy to eat well or is having trouble sleeping.   Your baby is wetting less than 6 diapers per day, by 56 days of age.   Your baby's skin or white part of his or her eyes is more yellow than it was in the hospital.   You feel depressed.  Document Released: 02/22/2005 Document Revised: 11/04/2010 Document Reviewed: 10/07/2008 [facility] Patient Information 2012 [place], [place].

## 2011-03-22 NOTE — Progress Notes (Signed)
1/14 NST reviewed-category I tracing. Patient doing well without complaints. FM/labor precautions reviewed. Will schedule induction of labor on 1/24. Patient planning on using implanon for birth control and is planning on breastfeeding

## 2011-03-25 ENCOUNTER — Ambulatory Visit (INDEPENDENT_AMBULATORY_CARE_PROVIDER_SITE_OTHER): Payer: Medicaid Other | Admitting: *Deleted

## 2011-03-25 VITALS — BP 123/60

## 2011-03-25 DIAGNOSIS — O09299 Supervision of pregnancy with other poor reproductive or obstetric history, unspecified trimester: Secondary | ICD-10-CM

## 2011-03-25 NOTE — Progress Notes (Signed)
P = 81   NST only today

## 2011-03-27 ENCOUNTER — Inpatient Hospital Stay (HOSPITAL_COMMUNITY)
Admission: AD | Admit: 2011-03-27 | Discharge: 2011-03-28 | Disposition: A | Discharge status: Home / Self Care | Payer: Medicaid Other | Source: Ambulatory Visit | Attending: Obstetrics and Gynecology | Admitting: Obstetrics and Gynecology

## 2011-03-27 ENCOUNTER — Encounter (HOSPITAL_COMMUNITY): Payer: Self-pay | Admitting: Obstetrics and Gynecology

## 2011-03-27 DIAGNOSIS — O368190 Decreased fetal movements, unspecified trimester, not applicable or unspecified: Secondary | ICD-10-CM | POA: Insufficient documentation

## 2011-03-27 DIAGNOSIS — O09299 Supervision of pregnancy with other poor reproductive or obstetric history, unspecified trimester: Secondary | ICD-10-CM

## 2011-03-27 DIAGNOSIS — O0990 Supervision of high risk pregnancy, unspecified, unspecified trimester: Secondary | ICD-10-CM

## 2011-03-27 NOTE — Progress Notes (Signed)
"  I haven't felt the baby move much tonight.  She usually moves a lot at night.  I'm worried because I had a stillbirth at 64 weeks a couple of years ago.  No pain, leaking of fluid or bleeding."

## 2011-03-27 NOTE — Progress Notes (Signed)
Pt states, " I haven't felt my baby move as much tonight as normally."

## 2011-03-28 NOTE — ED Provider Notes (Signed)
History     Chief Complaint  Patient presents with  . Decreased Fetal Movement   HPI Presents today for decreased fetal movement.  History of stillbirth at 70 weeks a couple of years ago.   OB History    Grav Para Term Preterm Abortions TAB SAB Ect Mult Living   2 1 1        0      Past Medical History  Diagnosis Date  . Eczema   . Anemia   . Sickle cell trait     Past Surgical History  Procedure Date  . No past surgeries     Family History  Problem Relation Age of Onset  . Hypertension Father     History  Substance Use Topics  . Smoking status: Never Smoker   . Smokeless tobacco: Never Used  . Alcohol Use: No    Allergies: No Known Allergies  Prescriptions prior to admission  Medication Sig Dispense Refill  . Prenatal Vit-Fe Fumarate-FA (PRENATAL MULTIVITAMIN) TABS Take 1 tablet by mouth daily.        ROS Denies ROM, vaginal bleeding.  Physical Exam   Blood pressure 117/55, pulse 74, temperature 98 F (36.7 C), temperature source Oral, height 5' 6.5" (1.689 m), weight 176 lb 6 oz (80.003 kg), last menstrual period 07/02/2010.  Physical Exam  MAU Course  Procedures FHT: 145, moderate variability, accels, no decels Contractions: occasional  Cervical exam @ 12:15: 3cm, 80%, -3; vertex  MDM Monitored on CST for about an hour.  Good FHT.   Assessment and Plan  21 YO G2P1001 here due to concern for decreased fetal movement.  FHT reassuring.  Discharge to home.  Given labor precautions. Keep appointment at [facility] for Monday 01/21. Induction scheduled for later this week.    OH PARK, Inmer Nix 03/28/2011, 12:13 AM

## 2011-03-29 ENCOUNTER — Encounter (HOSPITAL_COMMUNITY): Payer: Self-pay

## 2011-03-29 ENCOUNTER — Observation Stay (HOSPITAL_COMMUNITY)
Admission: AD | Admit: 2011-03-29 | Discharge: 2011-03-29 | Disposition: A | Discharge status: Home / Self Care | Payer: Medicaid Other | Source: Ambulatory Visit | Attending: Obstetrics & Gynecology | Admitting: Obstetrics & Gynecology

## 2011-03-29 ENCOUNTER — Ambulatory Visit (INDEPENDENT_AMBULATORY_CARE_PROVIDER_SITE_OTHER): Payer: Medicaid Other | Admitting: Obstetrics and Gynecology

## 2011-03-29 DIAGNOSIS — N390 Urinary tract infection, site not specified: Secondary | ICD-10-CM

## 2011-03-29 DIAGNOSIS — O09299 Supervision of pregnancy with other poor reproductive or obstetric history, unspecified trimester: Secondary | ICD-10-CM

## 2011-03-29 DIAGNOSIS — O99891 Other specified diseases and conditions complicating pregnancy: Principal | ICD-10-CM | POA: Insufficient documentation

## 2011-03-29 DIAGNOSIS — O99019 Anemia complicating pregnancy, unspecified trimester: Secondary | ICD-10-CM

## 2011-03-29 DIAGNOSIS — B951 Streptococcus, group B, as the cause of diseases classified elsewhere: Secondary | ICD-10-CM

## 2011-03-29 DIAGNOSIS — O2390 Unspecified genitourinary tract infection in pregnancy, unspecified trimester: Secondary | ICD-10-CM

## 2011-03-29 DIAGNOSIS — O9989 Other specified diseases and conditions complicating pregnancy, childbirth and the puerperium: Secondary | ICD-10-CM

## 2011-03-29 DIAGNOSIS — O0990 Supervision of high risk pregnancy, unspecified, unspecified trimester: Secondary | ICD-10-CM

## 2011-03-29 LAB — CBC
MCH: 26.6 pg (ref 26.0–34.0)
MCV: 78.9 fL (ref 78.0–100.0)
Platelets: 262 10*3/uL (ref 150–400)
RBC: 5.11 MIL/uL (ref 3.87–5.11)

## 2011-03-29 LAB — POCT URINALYSIS DIP (DEVICE)
Bilirubin Urine: NEGATIVE
Glucose, UA: NEGATIVE mg/dL
Hgb urine dipstick: NEGATIVE
Specific Gravity, Urine: 1.015 (ref 1.005–1.030)
pH: 7 (ref 5.0–8.0)

## 2011-03-29 MED ORDER — LACTATED RINGERS IV SOLN: INTRAVENOUS | Status: DC

## 2011-03-29 MED ORDER — FLEET ENEMA 7-19 GM/118ML RE ENEM: 1.0000 | ENEMA | RECTAL | Status: DC | PRN

## 2011-03-29 MED ORDER — OXYCODONE-ACETAMINOPHEN 5-325 MG PO TABS: 2.0000 | ORAL_TABLET | ORAL | Status: DC | PRN

## 2011-03-29 MED ORDER — LACTATED RINGERS IV SOLN
500.0000 mL | INTRAVENOUS | Status: DC | PRN
Start: 2011-03-29 — End: 2011-03-29

## 2011-03-29 MED ORDER — LIDOCAINE HCL (PF) 1 % IJ SOLN: 30.0000 mL | INTRAMUSCULAR | Status: DC | PRN

## 2011-03-29 MED ORDER — OXYTOCIN BOLUS FROM INFUSION
500.0000 mL | Freq: Once | INTRAVENOUS | Status: DC
  Filled 2011-03-29: qty 500

## 2011-03-29 MED ORDER — IBUPROFEN 600 MG PO TABS: 600.0000 mg | ORAL_TABLET | Freq: Four times a day (QID) | ORAL | Status: DC | PRN

## 2011-03-29 MED ORDER — ONDANSETRON HCL 4 MG/2ML IJ SOLN: 4.0000 mg | Freq: Four times a day (QID) | INTRAMUSCULAR | Status: DC | PRN

## 2011-03-29 MED ORDER — CITRIC ACID-SODIUM CITRATE 334-500 MG/5ML PO SOLN: 30.0000 mL | ORAL | Status: DC | PRN

## 2011-03-29 MED ORDER — ACETAMINOPHEN 325 MG PO TABS: 650.0000 mg | ORAL_TABLET | ORAL | Status: DC | PRN

## 2011-03-29 MED ORDER — OXYTOCIN 20 UNITS IN LACTATED RINGERS INFUSION - SIMPLE: 125.0000 mL/h | Freq: Once | INTRAVENOUS | Status: DC

## 2011-03-29 NOTE — Progress Notes (Signed)
1/21 NST reviewed-category I. Patient doing well without complaints. Patient with h/o unexplained IUFD at 38 weeks. L&D and attending on call contacted to inform of induction of labor for today. Patient seen on 1/19 and cervix was 3/80%

## 2011-03-29 NOTE — Discharge Summary (Signed)
Obstetric Discharge Summary Reason for Admission: induction of labor  Hemoglobin  Date Value Range Status  03/29/2011 13.6  12.0-15.0 (g/dL) Final     HCT  Date Value Range Status  03/29/2011 40.3  36.0-46.0 (%) Final   Clinical Course: Pt was admitted for IOL, upon review of records, pt is 38.4 weeks by LMP confirmed by 19 week U/S. Reviewed pt information with Dr Debroah Loop.  Plan is for IOL at 39 weeks.  Induction rescheduled for Thursday at 7:30 pm.  Discussed change in plan with pt.  Pt states understanding.  Discussed fetal movement/kick counting and how/when to return to MAU.  Discharge Diagnoses: Term pregnancy, Reactive NST  Discharge Information: Date: 03/29/2011 Activity: unrestricted Diet: routine Medications: None Condition: stable Instructions: Continue fetal movement counting, return to MAU with decreased movement. Discharge to: home  LEFTWICH-KIRBY, LISA 03/29/2011, 5:30 PM

## 2011-03-29 NOTE — H&P (Signed)
Patient here for induction for Hx of IUFD at 38wks, unknown cause She has no complaints today, was seen for routine visit today, had category I tracing. Was seen 3d ago with complaints of decreased fetal movement, had reassuring tracing, but decision was made today to induce  Patient [facility] for Hx of IUFD. No other complications. Maternal labs negative except (+)GBS per patient.    Maternal Medical History:  Reason for admission: Reason for Admission:   nauseaContractions: Onset was yesterday.   Frequency: irregular.   Perceived severity is mild.    Fetal activity: Perceived fetal activity is normal.   Last perceived fetal movement was within the past hour.    Prenatal Complications - Diabetes: none.    OB History    Grav Para Term Preterm Abortions TAB SAB Ect Mult Living   2 1 1        0     Past Medical History  Diagnosis Date  . Eczema   . Anemia   . Sickle cell trait    Past Surgical History  Procedure Date  . No past surgeries    Family History: family history includes Hypertension in her father. Social History:  reports that she has never smoked. She has never used smokeless tobacco. She reports that she does not drink alcohol or use illicit drugs.  Review of Systems  Constitutional: Negative.  Negative for fever and chills.  Eyes: Negative for blurred vision and double vision.  Respiratory: Negative for shortness of breath.   Cardiovascular: Negative for chest pain and palpitations.  Gastrointestinal: Negative for heartburn, nausea, vomiting, abdominal pain, diarrhea and constipation.  Genitourinary: Negative for dysuria and urgency.  Neurological: Negative for headaches.  All other systems reviewed and are negative.      Blood pressure 124/66, pulse 82, temperature 97.6 F (36.4 C), temperature source Oral, resp. rate 18, height 5\' 8"  (1.727 m), weight 79.379 kg (175 lb), last menstrual period 07/02/2010. Maternal Exam:    Uterine Assessment: Contraction strength is mild.    Fetal Exam Fetal Monitor Review: Baseline rate: 140.  Variability: moderate (6-25 bpm).   Pattern: accelerations present and no decelerations.    Fetal State Assessment: Category I - tracings are normal.     Physical Exam  Constitutional: She is oriented to person, place, and time. She appears well-developed and well-nourished. No distress.  HENT:  Head: Normocephalic and atraumatic.  Cardiovascular: Normal rate, regular rhythm, normal heart sounds and intact distal pulses.  Exam reveals no gallop and no friction rub.   No murmur heard. Respiratory: Effort normal and breath sounds normal. No respiratory distress. She has no wheezes. She exhibits no tenderness.  GI: Soft. Bowel sounds are normal. She exhibits no distension and no mass. There is no tenderness. There is no rebound and no guarding.  Musculoskeletal: Normal range of motion. She exhibits no edema and no tenderness.  Neurological: She is alert and oriented to person, place, and time. She has normal reflexes.  Skin: Skin is warm and dry. No rash noted. She is not diaphoretic. No erythema. No pallor.  Psychiatric: She has a normal mood and affect. Her behavior is normal. Judgment and thought content normal.    Prenatal labs: ABO, Rh: B/Positive/-- (08/27 0000) Antibody: Negative (08/27 0000) Rubella: Immune (08/27 0000) RPR: NON REAC (11/01 1130)  HBsAg: Negative (08/27 0000)  HIV: NON REACTIVE (11/01 1130)  GBS:     Assessment/Plan: Induction of labor  - Hx IUFD at  38wks  - admit to L&D  - augment with cervical ripening agents if cervix unfavorable  - induction with Pitocin  - epidural prn  - GBS prophylaxis if needed  - routine care    Cameron Proud 03/29/2011, 2:45 PM

## 2011-03-29 NOTE — Progress Notes (Signed)
Traces of edema in hands and feet.

## 2011-03-30 ENCOUNTER — Telehealth (HOSPITAL_COMMUNITY): Payer: Self-pay | Admitting: *Deleted

## 2011-03-30 NOTE — Telephone Encounter (Signed)
Preadmission screen  

## 2011-03-31 NOTE — H&P (Signed)
See discharge summary

## 2011-04-01 ENCOUNTER — Inpatient Hospital Stay (HOSPITAL_COMMUNITY)
Admission: AD | Admit: 2011-04-01 | Discharge: 2011-04-04 | DRG: 775 | Disposition: A | Discharge status: Home / Self Care | Payer: Medicaid Other | Source: Ambulatory Visit | Attending: Obstetrics & Gynecology | Admitting: Obstetrics & Gynecology

## 2011-04-01 ENCOUNTER — Encounter (HOSPITAL_COMMUNITY): Payer: Self-pay | Admitting: Anesthesiology

## 2011-04-01 ENCOUNTER — Inpatient Hospital Stay (HOSPITAL_COMMUNITY): Payer: Medicaid Other | Admitting: Anesthesiology

## 2011-04-01 ENCOUNTER — Inpatient Hospital Stay (HOSPITAL_COMMUNITY): Admission: RE | Admit: 2011-04-01 | Payer: Self-pay | Source: Ambulatory Visit

## 2011-04-01 DIAGNOSIS — Z22330 Carrier of Group B streptococcus: Secondary | ICD-10-CM

## 2011-04-01 DIAGNOSIS — B951 Streptococcus, group B, as the cause of diseases classified elsewhere: Secondary | ICD-10-CM

## 2011-04-01 DIAGNOSIS — O0990 Supervision of high risk pregnancy, unspecified, unspecified trimester: Secondary | ICD-10-CM

## 2011-04-01 DIAGNOSIS — O09299 Supervision of pregnancy with other poor reproductive or obstetric history, unspecified trimester: Secondary | ICD-10-CM

## 2011-04-01 DIAGNOSIS — D573 Sickle-cell trait: Secondary | ICD-10-CM | POA: Diagnosis present

## 2011-04-01 DIAGNOSIS — O99892 Other specified diseases and conditions complicating childbirth: Secondary | ICD-10-CM | POA: Diagnosis present

## 2011-04-01 DIAGNOSIS — O9902 Anemia complicating childbirth: Secondary | ICD-10-CM | POA: Diagnosis present

## 2011-04-01 DIAGNOSIS — O99019 Anemia complicating pregnancy, unspecified trimester: Secondary | ICD-10-CM

## 2011-04-01 LAB — CBC
HCT: 39.3 % (ref 36.0–46.0)
Hemoglobin: 13.1 g/dL (ref 12.0–15.0)
MCH: 26 pg (ref 26.0–34.0)
MCHC: 33.3 g/dL (ref 30.0–36.0)

## 2011-04-01 MED ORDER — PHENYLEPHRINE 40 MCG/ML (10ML) SYRINGE FOR IV PUSH (FOR BLOOD PRESSURE SUPPORT): 80.0000 ug | PREFILLED_SYRINGE | INTRAVENOUS | Status: DC | PRN

## 2011-04-01 MED ORDER — ZOLPIDEM TARTRATE 10 MG PO TABS: 10.0000 mg | ORAL_TABLET | Freq: Every evening | ORAL | Status: DC | PRN

## 2011-04-01 MED ORDER — FLEET ENEMA 7-19 GM/118ML RE ENEM: 1.0000 | ENEMA | RECTAL | Status: DC | PRN

## 2011-04-01 MED ORDER — PENICILLIN G POTASSIUM 5000000 UNITS IJ SOLR
2.5000 10*6.[IU] | INTRAVENOUS | Status: DC
  Administered 2011-04-02 (×3): 2.5 10*6.[IU] via INTRAVENOUS
  Filled 2011-04-01 (×8): qty 2.5

## 2011-04-01 MED ORDER — OXYTOCIN 20 UNITS IN LACTATED RINGERS INFUSION - SIMPLE
1.0000 m[IU]/min | INTRAVENOUS | Status: DC
  Administered 2011-04-01: 2 m[IU]/min via INTRAVENOUS

## 2011-04-01 MED ORDER — EPHEDRINE 5 MG/ML INJ: 10.0000 mg | INTRAVENOUS | Status: DC | PRN

## 2011-04-01 MED ORDER — LACTATED RINGERS IV SOLN
500.0000 mL | Freq: Once | INTRAVENOUS | Status: AC
  Administered 2011-04-01: 500 mL via INTRAVENOUS

## 2011-04-01 MED ORDER — ONDANSETRON HCL 4 MG/2ML IJ SOLN: 4.0000 mg | Freq: Four times a day (QID) | INTRAMUSCULAR | Status: DC | PRN

## 2011-04-01 MED ORDER — LIDOCAINE HCL (PF) 1 % IJ SOLN
30.0000 mL | INTRAMUSCULAR | Status: DC | PRN
  Filled 2011-04-01: qty 30

## 2011-04-01 MED ORDER — FENTANYL 2.5 MCG/ML BUPIVACAINE 1/10 % EPIDURAL INFUSION (WH - ANES)
14.0000 mL/h | INTRAMUSCULAR | Status: DC
  Administered 2011-04-02: 14 mL/h via EPIDURAL
  Filled 2011-04-01 (×4): qty 60

## 2011-04-01 MED ORDER — DIPHENHYDRAMINE HCL 50 MG/ML IJ SOLN
12.5000 mg | INTRAMUSCULAR | Status: AC | PRN
  Administered 2011-04-02 (×3): 12.5 mg via INTRAVENOUS
  Filled 2011-04-01: qty 1

## 2011-04-01 MED ORDER — OXYTOCIN BOLUS FROM INFUSION
500.0000 mL | Freq: Once | INTRAVENOUS | Status: DC
  Filled 2011-04-01: qty 500

## 2011-04-01 MED ORDER — ACETAMINOPHEN 325 MG PO TABS: 650.0000 mg | ORAL_TABLET | ORAL | Status: DC | PRN

## 2011-04-01 MED ORDER — OXYTOCIN 20 UNITS IN LACTATED RINGERS INFUSION - SIMPLE
125.0000 mL/h | Freq: Once | INTRAVENOUS | Status: DC
  Filled 2011-04-01: qty 1000

## 2011-04-01 MED ORDER — CITRIC ACID-SODIUM CITRATE 334-500 MG/5ML PO SOLN: 30.0000 mL | ORAL | Status: DC | PRN

## 2011-04-01 MED ORDER — PHENYLEPHRINE 40 MCG/ML (10ML) SYRINGE FOR IV PUSH (FOR BLOOD PRESSURE SUPPORT)
80.0000 ug | PREFILLED_SYRINGE | INTRAVENOUS | Status: DC | PRN
  Filled 2011-04-01: qty 5

## 2011-04-01 MED ORDER — IBUPROFEN 600 MG PO TABS: 600.0000 mg | ORAL_TABLET | Freq: Four times a day (QID) | ORAL | Status: DC | PRN

## 2011-04-01 MED ORDER — TERBUTALINE SULFATE 1 MG/ML IJ SOLN: 0.2500 mg | Freq: Once | INTRAMUSCULAR | Status: AC | PRN

## 2011-04-01 MED ORDER — EPHEDRINE 5 MG/ML INJ
10.0000 mg | INTRAVENOUS | Status: DC | PRN
  Filled 2011-04-01: qty 4

## 2011-04-01 MED ORDER — PENICILLIN G POTASSIUM 5000000 UNITS IJ SOLR
5.0000 10*6.[IU] | Freq: Once | INTRAMUSCULAR | Status: AC
  Administered 2011-04-01: 5 10*6.[IU] via INTRAVENOUS
  Filled 2011-04-01: qty 5

## 2011-04-01 MED ORDER — OXYCODONE-ACETAMINOPHEN 5-325 MG PO TABS: 2.0000 | ORAL_TABLET | ORAL | Status: DC | PRN

## 2011-04-01 MED ORDER — NALBUPHINE SYRINGE 5 MG/0.5 ML: 5.0000 mg | INJECTION | INTRAMUSCULAR | Status: DC | PRN

## 2011-04-01 MED ORDER — LACTATED RINGERS IV SOLN
INTRAVENOUS | Status: DC
  Administered 2011-04-01 – 2011-04-02 (×3): via INTRAVENOUS

## 2011-04-01 MED ORDER — LACTATED RINGERS IV SOLN
500.0000 mL | INTRAVENOUS | Status: DC | PRN
  Administered 2011-04-01 – 2011-04-02 (×2): 500 mL via INTRAVENOUS

## 2011-04-01 NOTE — H&P (Signed)
Evelyn [name] is a 21 y.o. year old G6P1000 female at [redacted]w[redacted]d weeks gestation who presents for IOL for Hx of unexplained IUFD at 38 weeks.  Pt has no complaints this evening, states good fetal movement.  She received prenatal care at [facility] for Hx of IUFD, per chart no complications with this pregnancy, patient is GBS (+)  Patient Active Problem List  Diagnoses  . Prior pregnancy with fetal demise  . GBS (group B streptococcus) UTI complicating pregnancy  . Anemia, antepartum  . Sickle cell trait  . Pregnancy, supervision, high-risk   Maternal Medical History:  Reason for admission: Reason for Admission:   nausea  OB History    Grav Para Term Preterm Abortions TAB SAB Ect Mult Living   2 1 1        0     Past Medical History  Diagnosis Date  . Eczema   . Anemia   . Sickle cell trait    Past Surgical History  Procedure Date  . No past surgeries    Family History: family history includes Hypertension in her father. Social History:  reports that she has never smoked. She has never used smokeless tobacco. She reports that she does not drink alcohol or use illicit drugs.  Review of Systems  Constitutional: Negative for chills.  Eyes: Negative for blurred vision and double vision.  Respiratory: Negative for cough and shortness of breath.   Cardiovascular: Negative for chest pain and palpitations.  Gastrointestinal: Negative for nausea, vomiting, abdominal pain and diarrhea.  Neurological: Negative for headaches.  All other systems reviewed and are negative.       Last menstrual period 07/02/2010. Maternal Exam:  Uterine Assessment: Contraction strength is mild.  Abdomen: Patient reports no abdominal tenderness.   Fetal Exam Fetal Monitor Review: Baseline rate: 150.  Variability: moderate (6-25 bpm).   Pattern: accelerations present and no decelerations.    Fetal State Assessment: Category I - tracings are normal.     Physical Exam  Nursing note and  vitals reviewed. Constitutional: She is oriented to person, place, and time. She appears well-developed and well-nourished. No distress.  HENT:  Head: Normocephalic and atraumatic.  Eyes: EOM are normal. Pupils are equal, round, and reactive to light.  Neck: Normal range of motion. Neck supple.  Cardiovascular: Normal rate, regular rhythm, normal heart sounds and intact distal pulses.  Exam reveals no gallop and no friction rub.   No murmur heard. Respiratory: Effort normal and breath sounds normal. No respiratory distress. She has no wheezes. She has no rales. She exhibits no tenderness.  GI: Soft. Bowel sounds are normal.  Musculoskeletal: Normal range of motion. She exhibits no edema and no tenderness.  Neurological: She is alert and oriented to person, place, and time. She has normal reflexes.  Skin: Skin is warm and dry. No rash noted. She is not diaphoretic. No erythema. No pallor.  Psychiatric: She has a normal mood and affect. Her behavior is normal. Judgment and thought content normal.    Prenatal labs: ABO, Rh: B/Positive/-- (08/27 0000) Antibody: Negative (08/27 0000) Rubella: Immune (08/27 0000) RPR: NON REACTIVE (01/21 1317)  HBsAg: Negative (08/27 0000)  HIV: NON REACTIVE (11/01 1130)  GBS:  pos 1 hour GTT 93 Quad screen neg  Assessment/Plan: Assessment: 1. Labor: IOL 2. Fetal Wellbeing: Category 1 3. Pain Control: none 4. GBS: pos 5. 39.0 week IUP  Plan:  1. Admit to BS per consult with MD 2. Routine L&D orders 3. PCN  for GBS (+) 4. Pitocin for induction  Anticipate vaginal delivery       Dorathy Kinsman 04/01/2011, 7:47 PM

## 2011-04-01 NOTE — Anesthesia Preprocedure Evaluation (Signed)
Anesthesia Evaluation  Patient identified by MRN, date of birth, ID band Patient awake    Reviewed: Allergy & Precautions, H&P , NPO status , Patient's Chart, lab work & pertinent test results  Airway Mallampati: I TM Distance: >3 FB Neck ROM: full    Dental No notable dental hx.    Pulmonary neg pulmonary ROS,    Pulmonary exam normal       Cardiovascular neg cardio ROS     Neuro/Psych Negative Neurological ROS  Negative Psych ROS   GI/Hepatic negative GI ROS, Neg liver ROS,   Endo/Other  Negative Endocrine ROS  Renal/GU negative Renal ROS     Musculoskeletal negative musculoskeletal ROS (+)   Abdominal Normal abdominal exam  (+)   Peds negative pediatric ROS (+)  Hematology negative hematology ROS (+)   Anesthesia Other Findings   Reproductive/Obstetrics (+) Pregnancy                           Anesthesia Physical Anesthesia Plan  ASA: II  Anesthesia Plan: Epidural   Post-op Pain Management:    Induction:   Airway Management Planned:   Additional Equipment:   Intra-op Plan:   Post-operative Plan:   Informed Consent: I have reviewed the patients History and Physical, chart, labs and discussed the procedure including the risks, benefits and alternatives for the proposed anesthesia with the patient or authorized representative who has indicated his/her understanding and acceptance.     Plan Discussed with:   Anesthesia Plan Comments:         Anesthesia Quick Evaluation

## 2011-04-01 NOTE — Progress Notes (Signed)
Evelyn [name] is a 21 y.o. G2P1000 at [redacted]w[redacted]d by ultrasound admitted for induction of labor due to Hx of IUFD.  Subjective: Patient feeling contractions more, starting to be painful. States that she would like an epidural.  No other complaints  Objective: BP 119/63  Pulse 71  Temp(Src) 97.9 F (36.6 C) (Oral)  Resp 19  Ht 5\' 8"  (1.727 m)  Wt 175 lb (79.379 kg)  BMI 26.61 kg/m2  LMP 07/02/2010      FHT:  FHR: 150 bpm, variability: moderate,  accelerations:  Present,  decelerations:  Absent UC:   irregular, every 2-3 minutes SVE:   Dilation: 4.5 Effacement (%): 80 Station: -2 Exam by:: Alycia Rossetti, E. resident  Labs: Lab Results  Component Value Date   WBC 6.8 04/01/2011   HGB 13.1 04/01/2011   HCT 39.3 04/01/2011   MCV 78.1 04/01/2011   PLT 270 04/01/2011    Assessment / Plan: Induction of labor due to Hx IUFD,  progressing well on pitocin  Labor: Progressing on Pitocin, will continue to increase then AROM Preeclampsia:  n oSSx Fetal Wellbeing:  Category I Pain Control:  will order epidural prn I/D:  PCN Anticipated MOD:  NSVD  Cameron Proud 04/01/2011, 11:27 PM

## 2011-04-02 ENCOUNTER — Encounter (HOSPITAL_COMMUNITY): Payer: Self-pay

## 2011-04-02 DIAGNOSIS — O9989 Other specified diseases and conditions complicating pregnancy, childbirth and the puerperium: Secondary | ICD-10-CM

## 2011-04-02 DIAGNOSIS — D573 Sickle-cell trait: Secondary | ICD-10-CM

## 2011-04-02 DIAGNOSIS — O9902 Anemia complicating childbirth: Secondary | ICD-10-CM

## 2011-04-02 MED ORDER — ONDANSETRON HCL 4 MG PO TABS: 4.0000 mg | ORAL_TABLET | ORAL | Status: DC | PRN

## 2011-04-02 MED ORDER — WITCH HAZEL-GLYCERIN EX PADS: 1.0000 "application " | MEDICATED_PAD | CUTANEOUS | Status: DC | PRN

## 2011-04-02 MED ORDER — BENZOCAINE-MENTHOL 20-0.5 % EX AERO
1.0000 "application " | INHALATION_SPRAY | CUTANEOUS | Status: DC | PRN
  Filled 2011-04-02: qty 56

## 2011-04-02 MED ORDER — LIDOCAINE HCL 1.5 % IJ SOLN
INTRAMUSCULAR | Status: DC | PRN
  Administered 2011-04-02 (×2): 5 mL via EPIDURAL

## 2011-04-02 MED ORDER — TETANUS-DIPHTH-ACELL PERTUSSIS 5-2.5-18.5 LF-MCG/0.5 IM SUSP: 0.5000 mL | Freq: Once | INTRAMUSCULAR | Status: DC

## 2011-04-02 MED ORDER — DIPHENHYDRAMINE HCL 25 MG PO CAPS
25.0000 mg | ORAL_CAPSULE | Freq: Four times a day (QID) | ORAL | Status: DC | PRN
  Administered 2011-04-02: 25 mg via ORAL
  Filled 2011-04-02: qty 1

## 2011-04-02 MED ORDER — DIBUCAINE 1 % RE OINT: 1.0000 "application " | TOPICAL_OINTMENT | RECTAL | Status: DC | PRN

## 2011-04-02 MED ORDER — LANOLIN HYDROUS EX OINT: TOPICAL_OINTMENT | CUTANEOUS | Status: DC | PRN

## 2011-04-02 MED ORDER — ONDANSETRON HCL 4 MG/2ML IJ SOLN: 4.0000 mg | INTRAMUSCULAR | Status: DC | PRN

## 2011-04-02 MED ORDER — ZOLPIDEM TARTRATE 5 MG PO TABS: 5.0000 mg | ORAL_TABLET | Freq: Every evening | ORAL | Status: DC | PRN

## 2011-04-02 MED ORDER — PRENATAL MULTIVITAMIN CH
1.0000 | ORAL_TABLET | Freq: Every day | ORAL | Status: DC
  Administered 2011-04-03: 1 via ORAL
  Filled 2011-04-02: qty 1

## 2011-04-02 MED ORDER — OXYCODONE-ACETAMINOPHEN 5-325 MG PO TABS
1.0000 | ORAL_TABLET | ORAL | Status: DC | PRN
  Administered 2011-04-02: 1 via ORAL
  Filled 2011-04-02: qty 1

## 2011-04-02 MED ORDER — IBUPROFEN 600 MG PO TABS
600.0000 mg | ORAL_TABLET | Freq: Four times a day (QID) | ORAL | Status: DC
  Administered 2011-04-02 – 2011-04-04 (×8): 600 mg via ORAL
  Filled 2011-04-02 (×8): qty 1

## 2011-04-02 MED ORDER — TRIAMCINOLONE ACETONIDE 0.1 % EX CREA
TOPICAL_CREAM | Freq: Two times a day (BID) | CUTANEOUS | Status: DC
  Administered 2011-04-03 (×2): via TOPICAL
  Filled 2011-04-02: qty 15

## 2011-04-02 MED ORDER — SENNOSIDES-DOCUSATE SODIUM 8.6-50 MG PO TABS
2.0000 | ORAL_TABLET | Freq: Every day | ORAL | Status: DC
  Administered 2011-04-02 – 2011-04-03 (×2): 2 via ORAL

## 2011-04-02 MED ORDER — SIMETHICONE 80 MG PO CHEW: 80.0000 mg | CHEWABLE_TABLET | ORAL | Status: DC | PRN

## 2011-04-02 MED ORDER — FENTANYL 2.5 MCG/ML BUPIVACAINE 1/10 % EPIDURAL INFUSION (WH - ANES)
INTRAMUSCULAR | Status: DC | PRN
  Administered 2011-04-02: 08:00:00
  Administered 2011-04-02: 14 mL/h via EPIDURAL
  Administered 2011-04-02: 11:00:00

## 2011-04-02 NOTE — Progress Notes (Signed)
Pt calling family to return will push after thet arrive.

## 2011-04-02 NOTE — Anesthesia Postprocedure Evaluation (Signed)
Anesthesia Post Note  Patient: Evelyn [name]  Procedure(s) Performed: * No procedures listed *  Anesthesia type: Epidural  Patient location: Mother/Baby  Post pain: Pain level controlled  Post assessment: Post-op Vital signs reviewed  Last Vitals:  Filed Vitals:   04/02/11 1730  BP: 124/77  Pulse: 87  Temp: 36.3 C  Resp: 20    Post vital signs: Reviewed  Level of consciousness: awake  Complications: No apparent anesthesia complications

## 2011-04-02 NOTE — Progress Notes (Signed)
Patient ID: Evelyn [name], female   DOB: [birth year], 21 y.o.   MRN: [id number]  Getting epidural for pain.    FHR reassuring  UCs every 2-3 minutes  Cervix last check was: Dilation: 4 Effacement (%): 70 Station: -2 Presentation: Vertex Exam by:: ansah-mensah, rnc  at midnight.   Cervix exam deferred for now.

## 2011-04-02 NOTE — Progress Notes (Signed)
Subjective: Patient comfortable with epidural.  Objective: BP 126/64  Pulse 87  Temp(Src) 98.1 F (36.7 C) (Oral)  Resp 18  Ht 5\' 8"  (1.727 m)  Wt 79.379 kg (175 lb)  BMI 26.61 kg/m2  LMP 07/02/2010      FHT:  FHR: 150s bpm, variability: moderate,  accelerations:  Abscent,  decelerations:  Absent UC:   regular, every 2-3 minutes, 200 montevideo units. SVE:   Dilation: 5.5 Effacement (%): 80 Station: -2 Exam by:: Toney Sang. cnm  Labs: Lab Results  Component Value Date   WBC 6.8 04/01/2011   HGB 13.1 04/01/2011   HCT 39.3 04/01/2011   MCV 78.1 04/01/2011   PLT 270 04/01/2011    Assessment / Plan: IOL for previous late third trimester IUFD.  Category 1 tracing.  Adequate contractions.  Will recheck in 2-3 hours for change.  Evelyn [name] JEHIEL 04/02/2011, 10:40 AM

## 2011-04-02 NOTE — Progress Notes (Signed)
04/02/11 0702  Fetal Heart Rate A  Baseline Rate 150 bpm  Variability 6-25 BPM (minimal)  Accelerations 10 x 10  Decelerations Early   intervention continues

## 2011-04-02 NOTE — Progress Notes (Signed)
Subjective: Patient comfortable.  No pressure  Objective: BP 118/52  Pulse 71  Temp(Src) 98.2 F (36.8 C) (Oral)  Resp 20  Ht 5\' 8"  (1.727 m)  Wt 79.379 kg (175 lb)  BMI 26.61 kg/m2  LMP 07/02/2010     FHT:  FHR: 140s bpm, variability: moderate,  accelerations:  Abscent,  decelerations:  Absent UC:   regular, every 2-3 minutes SVE:   Dilation: 10 Effacement (%): 100 Station: +3 Exam by:: Shanera Meske, DO  Labs: Lab Results  Component Value Date   WBC 6.8 04/01/2011   HGB 13.1 04/01/2011   HCT 39.3 04/01/2011   MCV 78.1 04/01/2011   PLT 270 04/01/2011    Assessment / Plan: Will start pushing.  Evelyn [name] JEHIEL 04/02/2011, 1:09 PM

## 2011-04-02 NOTE — Progress Notes (Signed)
04/02/11 7829  Fetal Heart Rate A  Baseline Rate 160 bpm  Variability <5 BPM  Accelerations None  Decelerations Late   intervention continues

## 2011-04-02 NOTE — Progress Notes (Signed)
04/02/11 0602  Fetal Heart Rate A  Baseline Rate 150 bpm  Variability <5 BPM (moderate)  Accelerations 10 x 10  Decelerations None  intervention continues

## 2011-04-02 NOTE — Progress Notes (Signed)
Evelyn [name] is a 21 y.o. G2P1000 at [redacted]w[redacted]d by ultrasound admitted for induction of labor due to prevous IUFD at term.   Subjective:   Objective: BP 126/56  Pulse 73  Temp(Src) 98.4 F (36.9 C) (Oral)  Resp 19  Ht 5\' 8"  (1.727 m)  Wt 175 lb (79.379 kg)  BMI 26.61 kg/m2  LMP 07/02/2010      FHT:  FHR: 150 bpm, variability: moderate,  accelerations:  Abscent,  decelerations:  Absent   Just prior to this, FHR had a period of low variability.  Now variab. Is average but no accels are present. UC:   regular, every 3-4 minutes SVE:   Dilation: 5 Effacement (%): 70 Station: -2 Exam by:: ansah-mensah, rnc  Labs: Lab Results  Component Value Date   WBC 6.8 04/01/2011   HGB 13.1 04/01/2011   HCT 39.3 04/01/2011   MCV 78.1 04/01/2011   PLT 270 04/01/2011    Assessment / Plan: Induction of labor due to history of previous IUFD,  progressing well on pitocin  Labor: Progressing slowly, on Pitocin, AROM performed.  IUPC inserted  Will use amnioinfusion if develops decels. Preeclampsia:  n/a Fetal Wellbeing:  Category II Pain Control:  Epidural I/D:  n/a Anticipated MOD:  NSVD  WILLIAMS,MARIE 04/02/2011, 6:07 AM

## 2011-04-02 NOTE — Anesthesia Procedure Notes (Signed)
Epidural Patient location during procedure: OB Start time: 04/02/2011 12:02 AM End time: 04/02/2011 12:11 AM Reason for block: procedure for pain  Staffing Anesthesiologist: Sandrea Hughs Performed by: anesthesiologist   Preanesthetic Checklist Completed: patient identified, site marked, surgical consent, pre-op evaluation, timeout performed, IV checked, risks and benefits discussed and monitors and equipment checked  Epidural Patient position: sitting Prep: site prepped and draped and DuraPrep Patient monitoring: continuous pulse ox and blood pressure Approach: midline Injection technique: LOR air  Needle:  Needle type: Tuohy  Needle gauge: 17 G Needle length: 9 cm Needle insertion depth: 6 cm Catheter type: closed end flexible Catheter size: 19 Gauge Catheter at skin depth: 11 cm Test dose: negative and 1.5% lidocaine  Assessment Sensory level: T9 Events: blood not aspirated, injection not painful, no injection resistance, negative IV test and no paresthesia

## 2011-04-03 NOTE — Progress Notes (Signed)
Post Partum Day 1 Subjective: no complaints, up ad lib, voiding and tolerating PO  Objective: Blood pressure 126/70, pulse 76, temperature 98.3 F (36.8 C), temperature source Oral, resp. rate 18, height 5\' 8"  (1.727 m), weight 79.379 kg (175 lb), last menstrual period 07/02/2010, SpO2 97.00%, unknown if currently breastfeeding.  Physical Exam:  General: alert, cooperative and no distress Lochia: appropriate Uterine Fundus: firm DVT Evaluation: No evidence of DVT seen on physical exam.   Basename 04/01/11 2000  HGB 13.1  HCT 39.3    Assessment/Plan: Plan for discharge tomorrow.  Continue pain control.  Breast/bottle feeding.     LOS: 2 days   STINSON, JACOB JEHIEL 04/03/2011, 7:23 AM

## 2011-04-04 NOTE — Progress Notes (Signed)
Post Partum Day 2 Subjective: no complaints, up ad lib, voiding, tolerating PO and + flatus  Objective: Blood pressure 111/69, pulse 80, temperature 98.4 F (36.9 C), temperature source Oral, resp. rate 20, height 5\' 8"  (1.727 m), weight 79.379 kg (175 lb), last menstrual period 07/02/2010, SpO2 97.00%, unknown if currently breastfeeding.  Physical Exam:  General: alert, cooperative, appears stated age and no distress Lochia: appropriate Uterine Fundus: firm Incision: none DVT Evaluation: No evidence of DVT seen on physical exam. Negative Homan's sign. No cords or calf tenderness. No significant calf/ankle edema.   Basename 04/01/11 2000  HGB 13.1  HCT 39.3    Assessment/Plan: Discharge home and Contraception implanon Pain control: ibuprofen followup in 6wks    LOS: 3 days   Cameron Proud 04/04/2011, 7:19 AM

## 2011-04-04 NOTE — Discharge Summary (Signed)
Agree with above note.  Evelyn [name] H. 04/04/2011 7:28 PM  

## 2011-04-04 NOTE — Discharge Summary (Signed)
Obstetric Discharge Summary Reason for Admission: induction of labor Prenatal Procedures: none Intrapartum Procedures: spontaneous vaginal delivery and GBS prophylaxis Postpartum Procedures: none Complications-Operative and Postpartum: none Hemoglobin  Date Value Range Status  04/01/2011 13.1  12.0-15.0 (g/dL) Final     HCT  Date Value Range Status  04/01/2011 39.3  36.0-46.0 (%) Final    Discharge Diagnoses: Term Pregnancy-delivered  Discharge Information: Date: 04/04/2011 Activity: unrestricted Diet: routine Medications: PNV and Ibuprofen Condition: stable Instructions: refer to practice specific booklet Discharge to: home   Newborn Data: Live born female  Birth Weight: 7 lb 10.4 oz (3470 g) APGAR: 9, 9  Home with mother.  Cameron Proud 04/04/2011, 8:23 AM

## 2011-04-05 NOTE — Progress Notes (Signed)
Post discharge chart review completed.  

## 2011-04-14 ENCOUNTER — Encounter: Payer: Self-pay | Admitting: Obstetrics & Gynecology

## 2011-04-14 NOTE — Progress Notes (Signed)
  Subjective:    Patient ID: Evelyn [name], female    DOB: 19-Feb-1991, 21 y.o.   MRN: [id number]  HPI NST done 03/25/2011. Obix not available.   Review of Systems     Objective:   Physical Exam        Assessment & Plan:

## 2011-05-05 ENCOUNTER — Ambulatory Visit: Payer: Self-pay | Admitting: Obstetrics and Gynecology

## 2011-08-26 ENCOUNTER — Ambulatory Visit: Payer: Medicaid Other | Admitting: Advanced Practice Midwife

## 2011-08-27 ENCOUNTER — Ambulatory Visit (INDEPENDENT_AMBULATORY_CARE_PROVIDER_SITE_OTHER): Payer: Medicaid Other | Admitting: Family

## 2011-08-27 ENCOUNTER — Encounter: Payer: Self-pay | Admitting: Family

## 2011-08-27 VITALS — BP 125/81 | HR 84 | Temp 96.6°F | Ht 67.0 in | Wt 133.8 lb

## 2011-08-27 DIAGNOSIS — O9081 Anemia of the puerperium: Secondary | ICD-10-CM

## 2011-08-27 DIAGNOSIS — O99019 Anemia complicating pregnancy, unspecified trimester: Secondary | ICD-10-CM

## 2011-08-27 MED ORDER — MEDROXYPROGESTERONE ACETATE 150 MG/ML IM SUSP: 150.0000 mg | INTRAMUSCULAR | Status: DC

## 2011-08-27 NOTE — Progress Notes (Signed)
  Subjective:     Evelyn [name] is a 21 y.o. female who presents for a postpartum visit. She is 4 months postpartum following a spontaneous vaginal delivery. I have fully reviewed the prenatal and intrapartum course. The delivery was at 39 gestational weeks. Outcome: spontaneous vaginal delivery. Anesthesia: epidural. Postpartum course has been unremarkable. Baby's course has been unremarkable. Baby is feeding by bottle Rush Barer. Bleeding no bleeding and LMP 08/16/11. Bowel function is normal. Bladder function is normal. Patient is sexually active. Contraception method is condoms. Postpartum depression screening: negative.  Pt desires depo provera for contraception.  Pt also reports being given an inhaler during pregnancy for wheezing.  Will follow-up with PCP for asthma evaluation.  The following portions of the patient's history were reviewed and updated as appropriate: allergies, current medications, past family history, past medical history, past social history, past surgical history and problem list.  Review of Systems Pertinent items are noted in HPI.   Objective:    BP 125/81  Pulse 84  Temp 96.6 F (35.9 C) (Oral)  Ht 5\' 7"  (1.702 m)  Wt 133 lb 12.8 oz (60.691 kg)  BMI 20.96 kg/m2  LMP 07/26/2011  Breastfeeding? No  General:  alert, cooperative and appears stated age   Breasts:  negative  Lungs: wheezes LUL  Heart:  regular rate and rhythm, S1, S2 normal, no murmur, click, rub or gallop  Abdomen: soft, non-tender; bowel sounds normal; no masses,  no organomegaly   Vulva:  not evaluated  Vagina: not evaluated  Cervix:  Not examined  Corpus: not examined  Adnexa:  no mass, fullness, tenderness  Rectal Exam: Not performed.        Assessment:    Normal postpartum exam. Pap smear not done at today's visit.   Plan:    1. Contraception: Depo-Provera injections 2. Follow up in: 2 weeks for pregnancy test and depo injection.

## 2011-09-13 ENCOUNTER — Other Ambulatory Visit: Payer: Medicaid Other

## 2011-11-17 ENCOUNTER — Emergency Department (HOSPITAL_COMMUNITY)
Admission: EM | Admit: 2011-11-17 | Discharge: 2011-11-17 | Disposition: A | Discharge status: Home / Self Care | Payer: Medicaid Other | Attending: Emergency Medicine | Admitting: Emergency Medicine

## 2011-11-17 ENCOUNTER — Encounter (HOSPITAL_COMMUNITY): Payer: Self-pay | Admitting: Emergency Medicine

## 2011-11-17 DIAGNOSIS — N390 Urinary tract infection, site not specified: Secondary | ICD-10-CM | POA: Insufficient documentation

## 2011-11-17 DIAGNOSIS — D573 Sickle-cell trait: Secondary | ICD-10-CM | POA: Insufficient documentation

## 2011-11-17 DIAGNOSIS — R319 Hematuria, unspecified: Secondary | ICD-10-CM | POA: Insufficient documentation

## 2011-11-17 DIAGNOSIS — F17200 Nicotine dependence, unspecified, uncomplicated: Secondary | ICD-10-CM | POA: Insufficient documentation

## 2011-11-17 DIAGNOSIS — Z8249 Family history of ischemic heart disease and other diseases of the circulatory system: Secondary | ICD-10-CM | POA: Insufficient documentation

## 2011-11-17 LAB — URINALYSIS, ROUTINE W REFLEX MICROSCOPIC
Glucose, UA: NEGATIVE mg/dL
Protein, ur: NEGATIVE mg/dL
Urobilinogen, UA: 0.2 mg/dL (ref 0.0–1.0)

## 2011-11-17 LAB — PREGNANCY, URINE: Preg Test, Ur: NEGATIVE

## 2011-11-17 MED ORDER — SULFAMETHOXAZOLE-TRIMETHOPRIM 800-160 MG PO TABS: 1.0000 | ORAL_TABLET | Freq: Two times a day (BID) | ORAL | Status: AC

## 2011-11-17 MED ORDER — SULFAMETHOXAZOLE-TMP DS 800-160 MG PO TABS
1.0000 | ORAL_TABLET | Freq: Once | ORAL | Status: AC
  Administered 2011-11-17: 1 via ORAL
  Filled 2011-11-17: qty 1

## 2011-11-17 NOTE — ED Provider Notes (Signed)
History     CSN: [id number]  Arrival date & time 11/17/11  4098   First MD Initiated Contact with Patient 11/17/11 [phone]      Chief Complaint  Patient presents with  . Dysuria    (Consider location/radiation/quality/duration/timing/severity/associated sxs/prior treatment) Patient is a 21 y.o. female presenting with dysuria. The history is provided by the patient.  Dysuria  Associated symptoms include hematuria and urgency. Pertinent negatives include no chills, no nausea and no vomiting.   21 year old, female, with no significant past medical history presents emergency department complaining of hematuria and frequency.  For the past 3 days.  She denies pain.  She denies nausea, vomiting, fevers, chills.  She is sexually active.  Her last intercourse was a week ago.  Her last period was August 17 and it was normal.  Presently, she is asymptomatic.  Past Medical History  Diagnosis Date  . Eczema   . Anemia   . Sickle cell trait     Past Surgical History  Procedure Date  . No past surgeries     Family History  Problem Relation Age of Onset  . Hypertension Father     History  Substance Use Topics  . Smoking status: Current Every Day Smoker  . Smokeless tobacco: Never Used  . Alcohol Use: Yes    OB History    Grav Para Term Preterm Abortions TAB SAB Ect Mult Living   2 2 2       1       Review of Systems  Constitutional: Negative for fever and chills.  Gastrointestinal: Negative for nausea, vomiting and abdominal pain.  Genitourinary: Positive for urgency and hematuria. Negative for dysuria and vaginal discharge.  Skin: Negative for rash.  All other systems reviewed and are negative.    Allergies  Review of patient's allergies indicates no known allergies.  Home Medications   Current Outpatient Rx  Name Route Sig Dispense Refill  . TRIAMCINOLONE ACETONIDE 0.1 % EX CREA Topical Apply 1 application topically 2 (two) times daily.      BP 130/72  Pulse 76   Temp 98 F (36.7 C)  Resp 18  SpO2 98%  Breastfeeding? No  Physical Exam  Nursing note and vitals reviewed. Constitutional: She is oriented to person, place, and time. She appears well-developed and well-nourished. No distress.  HENT:  Head: Normocephalic and atraumatic.  Eyes: Conjunctivae normal and EOM are normal.  Neck: Normal range of motion.  Cardiovascular: Normal rate and intact distal pulses.   No murmur heard. Pulmonary/Chest: Effort normal and breath sounds normal.  Abdominal: Soft. Bowel sounds are normal. There is no tenderness.  Musculoskeletal: Normal range of motion.  Neurological: She is alert and oriented to person, place, and time.  Skin: Skin is warm and dry.  Psychiatric: She has a normal mood and affect. Thought content normal.    ED Course  Procedures (including critical care time) 21 year old, sexually active female, presents emergency department with history of hematuria and frequency.  The past 3 days.  She denies dysuria.  She does not have pain, nausea, vomiting, or fever.  Her physical examination is normal.  We'll perform a urinalysis, and pregnancy tests, for evaluation.   Labs Reviewed  URINALYSIS, ROUTINE W REFLEX MICROSCOPIC  PREGNANCY, URINE   No results found.   No diagnosis found.    MDM  Hematuria and frequency        Cheri Guppy, MD 11/17/11 [phone]

## 2011-11-17 NOTE — ED Notes (Signed)
Blood in ua x 3 days no pain she states denies vag d/c lmp8/17

## 2011-11-19 LAB — URINE CULTURE: Colony Count: >=100000

## 2011-11-20 NOTE — ED Notes (Signed)
+  Urine. Patient treated with Bactrim DS. Sensitive to same. Per protocol MD. °

## 2011-11-21 ENCOUNTER — Encounter (HOSPITAL_COMMUNITY): Payer: Self-pay | Admitting: Obstetrics and Gynecology

## 2011-11-21 ENCOUNTER — Inpatient Hospital Stay (HOSPITAL_COMMUNITY)
Admission: AD | Admit: 2011-11-21 | Discharge: 2011-11-21 | Disposition: A | Discharge status: Home / Self Care | Payer: Medicaid Other | Source: Ambulatory Visit | Attending: Obstetrics & Gynecology | Admitting: Obstetrics & Gynecology

## 2011-11-21 DIAGNOSIS — B49 Unspecified mycosis: Secondary | ICD-10-CM

## 2011-11-21 DIAGNOSIS — L293 Anogenital pruritus, unspecified: Secondary | ICD-10-CM | POA: Insufficient documentation

## 2011-11-21 DIAGNOSIS — B379 Candidiasis, unspecified: Secondary | ICD-10-CM

## 2011-11-21 DIAGNOSIS — B373 Candidiasis of vulva and vagina: Secondary | ICD-10-CM | POA: Insufficient documentation

## 2011-11-21 DIAGNOSIS — B3731 Acute candidiasis of vulva and vagina: Secondary | ICD-10-CM | POA: Insufficient documentation

## 2011-11-21 MED ORDER — TERCONAZOLE 0.4 % VA CREA: 1.0000 | TOPICAL_CREAM | Freq: Every day | VAGINAL | Status: AC

## 2011-11-21 MED ORDER — FLUCONAZOLE 150 MG PO TABS: 150.0000 mg | ORAL_TABLET | Freq: Once | ORAL | Status: AC

## 2011-11-21 NOTE — MAU Note (Signed)
Pt reports having vaginal itching and irritation for the past 2 days. Reports white discharge as well.

## 2011-11-21 NOTE — MAU Provider Note (Signed)
  History     CSN: [id number]  Arrival date and time: 11/21/11 1233   First Provider Initiated Contact with Patient 11/21/11 1344      Chief Complaint  Patient presents with  . Vaginal Itching   HPI Evelyn [name] 21 y.o.  Comes to MAU today with vulvar itching.  Has had a yeast infection before and thinks this is a yeast infection.  Is on antibiotics for UTI diagnosed on 11-17-11.  Is currently on menses.  OB History    Grav Para Term Preterm Abortions TAB SAB Ect Mult Living   2 2 2       1       Past Medical History  Diagnosis Date  . Eczema   . Anemia   . Sickle cell trait     Past Surgical History  Procedure Date  . No past surgeries     Family History  Problem Relation Age of Onset  . Hypertension Father     History  Substance Use Topics  . Smoking status: Current Every Day Smoker  . Smokeless tobacco: Never Used  . Alcohol Use: Yes    Allergies: No Known Allergies  Prescriptions prior to admission  Medication Sig Dispense Refill  . sulfamethoxazole-trimethoprim (BACTRIM DS,SEPTRA DS) 800-160 MG per tablet Take 1 tablet by mouth 2 (two) times daily.  6 tablet  0  . triamcinolone cream (KENALOG) 0.1 % Apply 1 application topically 2 (two) times daily.        Review of Systems  Constitutional: Negative for fever.  Gastrointestinal: Negative for nausea, vomiting, abdominal pain, diarrhea and constipation.  Genitourinary:       White vaginal discharge. Vaginal bleeding. No dysuria.   Physical Exam   Blood pressure 149/96, pulse 96, temperature 98.1 F (36.7 C), temperature source Oral, resp. rate 18, last menstrual period 11/20/2011, not currently breastfeeding.  Physical Exam  Nursing note and vitals reviewed. Constitutional: She is oriented to person, place, and time. She appears well-developed and well-nourished.  HENT:  Head: Normocephalic.  Eyes: EOM are normal.  Neck: Neck supple.  GI: Soft. There is no tenderness.  Genitourinary:   Has tampon in vagina. Vulva - edema and scabs from scratching seen over anterior vulva all around clitoris - no blisters or ulcers seen.  Entire area very tender.  Musculoskeletal: Normal range of motion.  Neurological: She is alert and oriented to person, place, and time.  Skin: Skin is warm and dry.  Psychiatric: She has a normal mood and affect.    MAU Course  Procedures  MDM Deferred speculum exam due to menses and having a tampon in place.  Likely has a yeast infection as she has had them previously and is on antibiotics for UTI.  Assessment and Plan  Yeast infection  Plan Rx diflucan 150 mg single dose Rx Terazol vaginal cream Use internally at HS and externally on irritated areas.  Evelyn [name] 11/21/2011, 1:52 PM

## 2012-01-03 ENCOUNTER — Emergency Department (INDEPENDENT_AMBULATORY_CARE_PROVIDER_SITE_OTHER)
Admission: EM | Admit: 2012-01-03 | Discharge: 2012-01-03 | Disposition: A | Discharge status: Home / Self Care | Payer: Medicaid Other | Source: Home / Self Care

## 2012-01-03 ENCOUNTER — Encounter (HOSPITAL_COMMUNITY): Payer: Self-pay | Admitting: *Deleted

## 2012-01-03 DIAGNOSIS — N72 Inflammatory disease of cervix uteri: Secondary | ICD-10-CM

## 2012-01-03 DIAGNOSIS — B349 Viral infection, unspecified: Secondary | ICD-10-CM

## 2012-01-03 DIAGNOSIS — N390 Urinary tract infection, site not specified: Secondary | ICD-10-CM

## 2012-01-03 DIAGNOSIS — N760 Acute vaginitis: Secondary | ICD-10-CM

## 2012-01-03 DIAGNOSIS — B9789 Other viral agents as the cause of diseases classified elsewhere: Secondary | ICD-10-CM

## 2012-01-03 DIAGNOSIS — E876 Hypokalemia: Secondary | ICD-10-CM

## 2012-01-03 LAB — POCT I-STAT, CHEM 8
Chloride: 102 mEq/L (ref 96–112)
Creatinine, Ser: 1.2 mg/dL — ABNORMAL HIGH (ref 0.50–1.10)
Glucose, Bld: 108 mg/dL — ABNORMAL HIGH (ref 70–99)
HCT: 41 % (ref 36.0–46.0)
Potassium: 3.2 mEq/L — ABNORMAL LOW (ref 3.5–5.1)

## 2012-01-03 LAB — POCT URINALYSIS DIP (DEVICE)
Nitrite: POSITIVE — AB
Protein, ur: 100 mg/dL — AB
Urobilinogen, UA: >=8 mg/dL (ref 0.0–1.0)

## 2012-01-03 MED ORDER — CIPROFLOXACIN HCL 500 MG PO TABS: 500.0000 mg | ORAL_TABLET | Freq: Two times a day (BID) | ORAL | Status: DC

## 2012-01-03 MED ORDER — METRONIDAZOLE 500 MG PO TABS: 500.0000 mg | ORAL_TABLET | Freq: Two times a day (BID) | ORAL | Status: DC

## 2012-01-03 MED ORDER — ONDANSETRON HCL 4 MG PO TABS: 4.0000 mg | ORAL_TABLET | Freq: Four times a day (QID) | ORAL | Status: DC

## 2012-01-03 MED ORDER — CEFTRIAXONE SODIUM 1 G IJ SOLR
1.0000 g | Freq: Once | INTRAMUSCULAR | Status: AC
  Administered 2012-01-03: 1 g via INTRAMUSCULAR

## 2012-01-03 MED ORDER — POTASSIUM CHLORIDE CRYS ER 20 MEQ PO TBCR: 20.0000 meq | EXTENDED_RELEASE_TABLET | Freq: Two times a day (BID) | ORAL | Status: DC

## 2012-01-03 MED ORDER — CEFTRIAXONE SODIUM 1 G IJ SOLR
INTRAMUSCULAR | Status: AC
  Filled 2012-01-03: qty 10

## 2012-01-03 NOTE — ED Notes (Signed)
pT  REPORTS   MULTIPLE   COMPLAINTS  OF      VAGINAL  DISCHARGE  X  1     MONTH  HEADACHE   AS  WELL  AS  CHILLS   FEVER  AND          BODY  ACHES    -  SHE           IS   AWAKE  AS  WELL  AS  ALERT  AND     ORIENTED

## 2012-01-03 NOTE — ED Provider Notes (Signed)
History     CSN: [id number]  Arrival date & time 01/03/12  1123   None     Chief Complaint  Patient presents with  . Vaginal Discharge    (Consider location/radiation/quality/duration/timing/severity/associated sxs/prior treatment) HPI Comments: 21 year old female presents with vaginal discharge for one month. This is associated with pelvic pain. 3 days ago she developed abdominal pain that hurts when she laughs. And today she vomited once she states she's had fever but has not taken her temperature. She denies chest pain or shortness of breath but does have dysuria and urinary frequency.   Past Medical History  Diagnosis Date  . Eczema   . Anemia   . Sickle cell trait     Past Surgical History  Procedure Date  . No past surgeries     Family History  Problem Relation Age of Onset  . Hypertension Father     History  Substance Use Topics  . Smoking status: Current Every Day Smoker  . Smokeless tobacco: Never Used  . Alcohol Use: Yes    OB History    Grav Para Term Preterm Abortions TAB SAB Ect Mult Living   2 2 2       1       Review of Systems  Constitutional: Positive for fever, chills, activity change and fatigue.  HENT: Positive for ear pain, nosebleeds, sore throat, rhinorrhea and postnasal drip. Negative for hearing loss, facial swelling, neck pain, neck stiffness and ear discharge.   Eyes: Negative.   Respiratory: Positive for shortness of breath. Negative for chest tightness and wheezing.   Cardiovascular: Negative for chest pain and leg swelling.  Gastrointestinal: Positive for nausea and vomiting.       Times one  Genitourinary: Positive for dysuria, frequency, vaginal discharge and pelvic pain. Negative for vaginal bleeding.  Musculoskeletal: Positive for arthralgias.  Neurological: Negative for tremors, syncope and speech difficulty.  Psychiatric/Behavioral: Negative.     Allergies  Review of patient's allergies indicates no known  allergies.  Home Medications   Current Outpatient Rx  Name Route Sig Dispense Refill  . CIPROFLOXACIN HCL 500 MG PO TABS Oral Take 1 tablet (500 mg total) by mouth 2 (two) times daily. 14 tablet 0  . METRONIDAZOLE 500 MG PO TABS Oral Take 1 tablet (500 mg total) by mouth 2 (two) times daily. X 7 days 14 tablet 0  . POTASSIUM CHLORIDE CRYS ER 20 MEQ PO TBCR Oral Take 1 tablet (20 mEq total) by mouth 2 (two) times daily. 10 tablet 0  . TRIAMCINOLONE ACETONIDE 0.1 % EX CREA Topical Apply 1 application topically 2 (two) times daily.      BP 107/63  Pulse 103  Temp 98.8 F (37.1 C) (Oral)  Resp 16  SpO2 100%  LMP 12/27/2011  Physical Exam  Constitutional: She is oriented to person, place, and time. She appears well-developed and well-nourished. No distress.  HENT:  Head: Normocephalic and atraumatic.  Mouth/Throat: No oropharyngeal exudate.       TMs pearly gray, transparent, no effusion or retraction or erythema. Oropharynx with cobblestoning mild erythema and clear PND  Eyes: Conjunctivae normal and EOM are normal. Right eye exhibits no discharge. Left eye exhibits no discharge.  Neck: Normal range of motion. Neck supple.  Cardiovascular: Normal rate, regular rhythm and normal heart sounds.   Pulmonary/Chest: Effort normal and breath sounds normal. No respiratory distress. She has no wheezes.  Abdominal: Soft. She exhibits no mass. There is tenderness. There is no rebound and no  guarding.       There is generalized tenderness in the abdomen. When raising her head or her legs off the bed and contracts the abdominal muscles palpation of these muscles reproduce the same pain in which she presents. She is tender over the rectus abdominous musculature  Musculoskeletal: Normal range of motion. She exhibits no edema and no tenderness.  Lymphadenopathy:    She has no cervical adenopathy.  Neurological: She is alert and oriented to person, place, and time. No cranial nerve deficit.  Skin:  Skin is warm and dry. No erythema.    ED Course  Procedures (including critical care time)  Labs Reviewed  POCT URINALYSIS DIP (DEVICE) - Abnormal; Notable for the following:    Bilirubin Urine SMALL (*)     Hgb urine dipstick SMALL (*)     Protein, ur 100 (*)     Nitrite POSITIVE (*)     Leukocytes, UA SMALL (*)  Biochemical Testing Only. Please order routine urinalysis from main lab if confirmatory testing is needed.   All other components within normal limits  POCT I-STAT, CHEM 8 - Abnormal; Notable for the following:    Potassium 3.2 (*)     Creatinine, Ser 1.20 (*)     Glucose, Bld 108 (*)     All other components within normal limits  POCT PREGNANCY, URINE  GC/CHLAMYDIA PROBE AMP, GENITAL  WET PREP, GENITAL   No results found.   1. UTI (lower urinary tract infection)   2. Viral syndrome   3. Hypokalemia, inadequate intake   4. Vaginitis   5. Cervicitis       MDM  The differential would also include early pyelonephritis. Rocephin 1 g IM now Rx for Flagyl 500 mg twice a day for 7 days Cipro 500 mg twice a day for 7 days  Potassium chloride 20 mEq twice a day for 5 days. Drink plenty of fluids stay well hydrated Zofran 4 mg by mouth every 6 hours when necessary nausea Abdominal pain she was complaining of clinically appears to be abdominal wall pain.        Hayden Rasmussen, NP 01/03/12 1423  Hayden Rasmussen, NP 01/03/12 1429

## 2012-01-04 LAB — GC/CHLAMYDIA PROBE AMP, GENITAL: GC Probe Amp, Genital: NEGATIVE

## 2012-01-04 NOTE — ED Provider Notes (Signed)
Medical screening examination/treatment/procedure(s) were performed by resident physician or non-physician practitioner and as supervising physician I was immediately available for consultation/collaboration.   Muhammed Teutsch DOUGLAS MD.    Tekelia Kareem D Stashia Sia, MD 01/04/12 0914 

## 2012-03-08 NOTE — L&D Delivery Note (Signed)
Delivery Note At 3:07 AM a viable and healthy female was delivered via Vaginal, Spontaneous Delivery (Presentation: Left Occiput Anterior).  APGAR: 9, ; weight pending.   Placenta status: Intact, Spontaneous.  Cord: 3 vessels with the following complications: None.    Anesthesia: Epidural  Episiotomy: None Lacerations: None Suture Repair: none Est. Blood Loss (mL): 300  Mom to postpartum.  Baby to nursery-stable.  Tawni Carnes 09/30/2012, 3:32 AM

## 2012-06-05 ENCOUNTER — Encounter (HOSPITAL_COMMUNITY): Payer: Self-pay | Admitting: *Deleted

## 2012-06-05 ENCOUNTER — Inpatient Hospital Stay (HOSPITAL_COMMUNITY)
Admission: AD | Admit: 2012-06-05 | Discharge: 2012-06-05 | Disposition: A | Discharge status: Home / Self Care | Payer: Medicaid Other | Source: Ambulatory Visit | Attending: Obstetrics & Gynecology | Admitting: Obstetrics & Gynecology

## 2012-06-05 DIAGNOSIS — R059 Cough, unspecified: Secondary | ICD-10-CM | POA: Insufficient documentation

## 2012-06-05 DIAGNOSIS — O99891 Other specified diseases and conditions complicating pregnancy: Secondary | ICD-10-CM | POA: Insufficient documentation

## 2012-06-05 DIAGNOSIS — J3489 Other specified disorders of nose and nasal sinuses: Secondary | ICD-10-CM | POA: Insufficient documentation

## 2012-06-05 DIAGNOSIS — R0602 Shortness of breath: Secondary | ICD-10-CM | POA: Insufficient documentation

## 2012-06-05 DIAGNOSIS — J069 Acute upper respiratory infection, unspecified: Secondary | ICD-10-CM

## 2012-06-05 DIAGNOSIS — R05 Cough: Secondary | ICD-10-CM | POA: Insufficient documentation

## 2012-06-05 MED ORDER — ALBUTEROL SULFATE (5 MG/ML) 0.5% IN NEBU
2.5000 mg | INHALATION_SOLUTION | RESPIRATORY_TRACT | Status: DC | PRN
  Administered 2012-06-05: 2.5 mg via RESPIRATORY_TRACT
  Filled 2012-06-05: qty 0.5

## 2012-06-05 MED ORDER — ALBUTEROL SULFATE HFA 108 (90 BASE) MCG/ACT IN AERS: 2.0000 | INHALATION_SPRAY | Freq: Four times a day (QID) | RESPIRATORY_TRACT | Status: DC | PRN

## 2012-06-05 MED ORDER — BENZONATATE 100 MG PO CAPS: 100.0000 mg | ORAL_CAPSULE | Freq: Three times a day (TID) | ORAL | Status: DC | PRN

## 2012-06-05 NOTE — MAU Provider Note (Signed)
History     CSN: [id number]  Arrival date and time: 06/05/12 1248   First Provider Initiated Contact with Patient 06/05/12 1349      Chief Complaint  Patient presents with  . Shortness of Breath  . Cough   HPI Evelyn [name] is a 22 y.o. female G3P2001 at [redacted]w[redacted]d who presents to MAU w/ complaint of shortness of breath since last night. She notes that since last week she has had nasal congestion, sore throat, cough, and frontal headache. Her cough is now productive of thick white mucus. No hemoptysis. She reports chest wheezing and chest tightness associated w/ her current dyspnea. She has post-tussive emesis. No fevers, chills, palpitations, dysuria, diarrhea, constipation, vaginal bleeding, discharge, leakage of fluid, or change in fetal movement. She reports having a similar episode of dyspnea during her last pregnancy which was treated w/ albuterol. Her infant at home is currently sick w/ a cough, no other sick contacts. She denies history of asthma, but has recent 2 pack-year smoking history, quit smoking 2 weeks ago.  OB History   Grav Para Term Preterm Abortions TAB SAB Ect Mult Living   3 2 2       1       Past Medical History  Diagnosis Date  . Eczema   . Anemia   . Sickle cell trait     Past Surgical History  Procedure Laterality Date  . No past surgeries      Family History  Problem Relation Age of Onset  . Hypertension Father     History  Substance Use Topics  . Smoking status: Former Smoker -- 1.00 packs/day    Quit date: 05/22/2012  . Smokeless tobacco: Never Used  . Alcohol Use: No    Allergies: No Known Allergies  Facility-administered medications prior to admission  Medication Dose Route Frequency Provider Last Rate Last Dose  . medroxyPROGESTERone (DEPO-PROVERA) injection 150 mg  150 mg Intramuscular Q90 days Walidah N Muhammad, CNM       No prescriptions prior to admission    Review of Systems  Constitutional: Negative for fever and chills.  HENT:  Positive for congestion and sore throat. Negative for hearing loss, ear pain, nosebleeds, tinnitus and ear discharge.   Eyes: Negative for blurred vision and double vision.  Respiratory: Positive for cough, sputum production (white sputum), shortness of breath and wheezing. Negative for hemoptysis and stridor.   Cardiovascular: Positive for chest pain (chest tightness w/ dyspnea). Negative for palpitations, orthopnea, claudication, leg swelling and PND.  Gastrointestinal: Positive for nausea and vomiting (post-tussive). Negative for abdominal pain, diarrhea, constipation, blood in stool and melena.  Genitourinary: Negative for dysuria, hematuria and flank pain.  Musculoskeletal: Negative for back pain.  Skin: Negative for rash.  Neurological: Positive for headaches (frontal sinus region). Negative for dizziness, tingling and sensory change.   Physical Exam   Blood pressure 112/57, pulse 92, temperature 97.8 F (36.6 C), temperature source Oral, resp. rate 22, height 5\' 6"  (1.676 m), weight 63.231 kg (139 lb 6.4 oz), last menstrual period 12/31/2011, SpO2 97.00%.  Physical Exam  Nursing note and vitals reviewed. Constitutional: She is oriented to person, place, and time. She appears well-developed and well-nourished. No distress.  HENT:  Head: Normocephalic and atraumatic.  Right Ear: Tympanic membrane, external ear and ear canal normal.  Left Ear: Tympanic membrane, external ear and ear canal normal.  Nose: Mucosal edema (erythema) present. No rhinorrhea, sinus tenderness or nasal deformity. Right sinus exhibits no maxillary sinus tenderness and  no frontal sinus tenderness. Left sinus exhibits no maxillary sinus tenderness and no frontal sinus tenderness.  Mouth/Throat: Oropharynx is clear and moist. No oropharyngeal exudate.  Eyes: Conjunctivae and EOM are normal. Pupils are equal, round, and reactive to light.  Neck: Normal range of motion. Neck supple.  Cardiovascular: Normal rate,  regular rhythm, normal heart sounds and intact distal pulses.  Exam reveals no gallop and no friction rub.   No murmur heard. Respiratory: Effort normal. No stridor. She has no decreased breath sounds. She has wheezes in the right upper field, the right middle field, the right lower field, the left upper field and the left lower field. She has no rhonchi. She has no rales.  GI: Soft. Bowel sounds are normal. She exhibits distension (gravid). She exhibits no mass. There is no tenderness. There is no rebound and no guarding.  Lymphadenopathy:    She has no cervical adenopathy.  Neurological: She is alert and oriented to person, place, and time.  Skin: Skin is warm and dry.  Psychiatric: She has a normal mood and affect. Her behavior is normal. Judgment and thought content normal.    MAU Course  Procedures  MDM  Meds ordered this encounter  Medications  . albuterol (PROVENTIL) (5 MG/ML) 0.5% nebulizer solution 2.5 mg    Sig:    Wheezing and dyspnea improved following albuterol treatment. Faint residual expiratory wheezing noted in right upper lung field. No additional breathing treatment required in MAU. Was prescribed albuterol inhaler w/ previous pregnancy, will prescribe inhaler again and have follow-up with outpatient OB clinic.  Assessment and Plan  1. Confirmed pregnancy at [redacted]w[redacted]d   - Referral to [facility] clinic  - Schedule outpatient ultrasound  - Recommend beginning PNV  2. Viral Upper Respiratory Infection  - Albuterol treatment, prescription for albuterol inhaler  - Saline nasal spray as needed  - Encouraged adequate hydration  - Acetaminophen as needed for pain and/or fever  - Tessalon perls for cough  - Mucinex OTC or Sudafed OTC as needed for sinus congestion  - Return to MAU if symptoms worsen  Follow-up Information   Follow up with [facility]. (You will be called for an appt)    Contact information:   [address] [place] [address] [phone]       Follow up with [facility] [place] [facility] On 06/07/2012. (1:45 PM)    Contact information:   [address] [id number] [place] [address] [phone]      Dorrene German 06/05/2012, 1:52 PM   I saw and examined patient along with student and agree with above note.   Tanuj Mullens 06/11/2012 9:44 AM

## 2012-06-05 NOTE — MAU Note (Signed)
Pt had cold sx's last week, started coughing & having SOB last night.  Productive cough of white sputum.  26 weeks, no PNC.  Pt denies abd pain or bleeding.

## 2012-06-07 ENCOUNTER — Ambulatory Visit (HOSPITAL_COMMUNITY)
Admission: RE | Admit: 2012-06-07 | Discharge: 2012-06-07 | Disposition: A | Discharge status: Home / Self Care | Payer: Medicaid Other | Source: Ambulatory Visit | Attending: Obstetrics & Gynecology | Admitting: Obstetrics & Gynecology

## 2012-06-07 DIAGNOSIS — Z363 Encounter for antenatal screening for malformations: Secondary | ICD-10-CM | POA: Insufficient documentation

## 2012-06-07 DIAGNOSIS — J069 Acute upper respiratory infection, unspecified: Secondary | ICD-10-CM

## 2012-06-07 DIAGNOSIS — O0930 Supervision of pregnancy with insufficient antenatal care, unspecified trimester: Secondary | ICD-10-CM | POA: Insufficient documentation

## 2012-06-07 DIAGNOSIS — O09299 Supervision of pregnancy with other poor reproductive or obstetric history, unspecified trimester: Secondary | ICD-10-CM | POA: Insufficient documentation

## 2012-06-07 DIAGNOSIS — O358XX0 Maternal care for other (suspected) fetal abnormality and damage, not applicable or unspecified: Secondary | ICD-10-CM | POA: Insufficient documentation

## 2012-06-07 DIAGNOSIS — Z1389 Encounter for screening for other disorder: Secondary | ICD-10-CM | POA: Insufficient documentation

## 2012-06-19 ENCOUNTER — Ambulatory Visit (INDEPENDENT_AMBULATORY_CARE_PROVIDER_SITE_OTHER): Payer: Medicaid Other | Admitting: Obstetrics and Gynecology

## 2012-06-19 ENCOUNTER — Encounter: Payer: Self-pay | Admitting: Obstetrics and Gynecology

## 2012-06-19 DIAGNOSIS — O09292 Supervision of pregnancy with other poor reproductive or obstetric history, second trimester: Secondary | ICD-10-CM

## 2012-06-19 DIAGNOSIS — O0932 Supervision of pregnancy with insufficient antenatal care, second trimester: Secondary | ICD-10-CM

## 2012-06-19 DIAGNOSIS — O0930 Supervision of pregnancy with insufficient antenatal care, unspecified trimester: Secondary | ICD-10-CM | POA: Insufficient documentation

## 2012-06-19 DIAGNOSIS — O0990 Supervision of high risk pregnancy, unspecified, unspecified trimester: Secondary | ICD-10-CM | POA: Insufficient documentation

## 2012-06-19 DIAGNOSIS — O09299 Supervision of pregnancy with other poor reproductive or obstetric history, unspecified trimester: Secondary | ICD-10-CM

## 2012-06-19 DIAGNOSIS — O0992 Supervision of high risk pregnancy, unspecified, second trimester: Secondary | ICD-10-CM

## 2012-06-19 LAB — POCT URINALYSIS DIP (DEVICE)
Bilirubin Urine: NEGATIVE
Glucose, UA: NEGATIVE mg/dL
Ketones, ur: NEGATIVE mg/dL

## 2012-06-19 NOTE — Progress Notes (Signed)
Pulse: 72 1hr gtt today: due at

## 2012-06-19 NOTE — Progress Notes (Signed)
   Subjective:    Evelyn [name] is a G3P2001 [redacted]w[redacted]d being seen today for her first obstetrical visit.  Her obstetrical history is significant for late to care and h/o IUFD. Patient presented late to care as she was not sure if she was going to keep the pregnancy. Patient does not intend to breast feed. Pregnancy history fully reviewed.  Patient reports no complaints.  Filed Vitals:   06/19/12 1029  BP: 104/62  Weight: 141 lb 4.8 oz (64.093 kg)    HISTORY: OB History   Grav Para Term Preterm Abortions TAB SAB Ect Mult Living   3 2 2       1      # Outc Date GA Lbr Len/2nd Wgt Sex Del Anes PTL Lv   1 TRM 9/10 [redacted]w[redacted]d  5lb6oz(2.438kg)  SVD EPI No ND   Comments: pt had prenatal @ [facility] examination could not find baby's heartbeat during routine prenatal care. States she did not have any problems or drug use. Pt was induced.     2 TRM 1/13 [redacted]w[redacted]d 14:09 / 00:49 7lb10.4oz(3.47kg) F SVD EPI  Yes   3 CUR              Past Medical History  Diagnosis Date  . Eczema   . Anemia   . Sickle cell trait    Past Surgical History  Procedure Laterality Date  . No past surgeries     Family History  Problem Relation Age of Onset  . Hypertension Father      Exam    Uterus:     Pelvic Exam:    Perineum: Normal Perineum   Vulva: normal   Vagina:  normal mucosa, normal discharge   pH:    Cervix: closed and long   Adnexa: not evaluated   Bony Pelvis: android  System: Breast:  normal appearance, no masses or tenderness   Skin: normal coloration and turgor, no rashes    Neurologic: oriented, no focal deficits   Extremities: normal strength, tone, and muscle mass   HEENT extra ocular movement intact   Mouth/Teeth mucous membranes moist, pharynx normal without lesions   Neck supple and no masses   Cardiovascular: regular rate and rhythm   Respiratory:  chest clear, no wheezing, crepitations, rhonchi, normal symmetric air entry   Abdomen: soft, gravid, NT   Urinary:        Assessment:    Pregnancy: G3P2001 Patient Active Problem List  Diagnosis  . Sickle cell trait  . Supervision of high-risk pregnancy  . Prior pregnancy with fetal demise and current pregnancy  . Insufficient prenatal care        Plan:     Initial labs drawn. Prenatal vitamins. Problem list reviewed and updated. Genetic Screening discussed : too late.  Ultrasound discussed; fetal survey: results reviewed. Patient plans on using Depo-Provera for birth control  Follow up in 3 weeks. 50% of 30 min visit spent on counseling and coordination of care.     Evelyn [name] 06/19/2012

## 2012-06-19 NOTE — Progress Notes (Signed)
Nutrition note: 1st visit consult Pt has gained 6.3# @ [redacted]w[redacted]d, which is < expected. Pt reports eating 3 meals & 2-3 snacks/d. Pt reports no N&V or heartburn.  Pt is taking PNV. Pt received verbal & written education on general nutrition during pregnancy. Disc importance of BF. Encouraged energy dense snacks.  Disc wt gain goals of 25-35# or 1#/wk. Pt agrees to cont taking PNV. Pt does not have WIC but plans to apply. Pt does not plan to BF. F/u if referred Blondell Reveal, MS, RD, LDN

## 2012-06-19 NOTE — Patient Instructions (Signed)
Pregnancy - Second Trimester The second trimester of pregnancy (3 to 6 months) is a period of rapid growth for you and your baby. At the end of the sixth month, your baby is about 9 inches long and weighs 1 1/2 pounds. You will begin to feel the baby move between 18 and 20 weeks of the pregnancy. This is called quickening. Weight gain is faster. A clear fluid (colostrum) may leak out of your breasts. You may feel small contractions of the womb (uterus). This is known as false labor or Braxton-Hicks contractions. This is like a practice for labor when the baby is ready to be born. Usually, the problems with morning sickness have usually passed by the end of your first trimester. Some women develop small dark blotches (called cholasma, mask of pregnancy) on their face that usually goes away after the baby is born. Exposure to the sun makes the blotches worse. Acne may also develop in some pregnant women and pregnant women who have acne, may find that it goes away. PRENATAL EXAMS  Blood work may continue to be done during prenatal exams. These tests are done to check on your health and the probable health of your baby. Blood work is used to follow your blood levels (hemoglobin). Anemia (low hemoglobin) is common during pregnancy. Iron and vitamins are given to help prevent this. You will also be checked for diabetes between 24 and 28 weeks of the pregnancy. Some of the previous blood tests may be repeated.  The size of the uterus is measured during each visit. This is to make sure that the baby is continuing to grow properly according to the dates of the pregnancy.  Your blood pressure is checked every prenatal visit. This is to make sure you are not getting toxemia.  Your urine is checked to make sure you do not have an infection, diabetes or protein in the urine.  Your weight is checked often to make sure gains are happening at the suggested rate. This is to ensure that both you and your baby are  growing normally.  Sometimes, an ultrasound is performed to confirm the proper growth and development of the baby. This is a test which bounces harmless sound waves off the baby so your caregiver can more accurately determine due dates. Sometimes, a specialized test is done on the amniotic fluid surrounding the baby. This test is called an amniocentesis. The amniotic fluid is obtained by sticking a needle into the belly (abdomen). This is done to check the chromosomes in instances where there is a concern about possible genetic problems with the baby. It is also sometimes done near the end of pregnancy if an early delivery is required. In this case, it is done to help make sure the baby's lungs are mature enough for the baby to live outside of the womb. CHANGES OCCURING IN THE SECOND TRIMESTER OF PREGNANCY Your body goes through many changes during pregnancy. They vary from person to person. Talk to your caregiver about changes you notice that you are concerned about.  During the second trimester, you will likely have an increase in your appetite. It is normal to have cravings for certain foods. This varies from person to person and pregnancy to pregnancy.  Your lower abdomen will begin to bulge.  You may have to urinate more often because the uterus and baby are pressing on your bladder. It is also common to get more bladder infections during pregnancy (pain with urination). You can help this by   drinking lots of fluids and emptying your bladder before and after intercourse.  You may begin to get stretch marks on your hips, abdomen, and breasts. These are normal changes in the body during pregnancy. There are no exercises or medications to take that prevent this change.  You may begin to develop swollen and bulging veins (varicose veins) in your legs. Wearing support hose, elevating your feet for 15 minutes, 3 to 4 times a day and limiting salt in your diet helps lessen the problem.  Heartburn may  develop as the uterus grows and pushes up against the stomach. Antacids recommended by your caregiver helps with this problem. Also, eating smaller meals 4 to 5 times a day helps.  Constipation can be treated with a stool softener or adding bulk to your diet. Drinking lots of fluids, vegetables, fruits, and whole grains are helpful.  Exercising is also helpful. If you have been very active up until your pregnancy, most of these activities can be continued during your pregnancy. If you have been less active, it is helpful to start an exercise program such as walking.  Hemorrhoids (varicose veins in the rectum) may develop at the end of the second trimester. Warm sitz baths and hemorrhoid cream recommended by your caregiver helps hemorrhoid problems.  Backaches may develop during this time of your pregnancy. Avoid heavy lifting, wear low heal shoes and practice good posture to help with backache problems.  Some pregnant women develop tingling and numbness of their hand and fingers because of swelling and tightening of ligaments in the wrist (carpel tunnel syndrome). This goes away after the baby is born.  As your breasts enlarge, you may have to get a bigger bra. Get a comfortable, cotton, support bra. Do not get a nursing bra until the last month of the pregnancy if you will be nursing the baby.  You may get a dark line from your belly button to the pubic area called the linea nigra.  You may develop rosy cheeks because of increase blood flow to the face.  You may develop spider looking lines of the face, neck, arms and chest. These go away after the baby is born. HOME CARE INSTRUCTIONS   It is extremely important to avoid all smoking, herbs, alcohol, and unprescribed drugs during your pregnancy. These chemicals affect the formation and growth of the baby. Avoid these chemicals throughout the pregnancy to ensure the delivery of a healthy infant.  Most of your home care instructions are the same  as suggested for the first trimester of your pregnancy. Keep your caregiver's appointments. Follow your caregiver's instructions regarding medication use, exercise and diet.  During pregnancy, you are providing food for you and your baby. Continue to eat regular, well-balanced meals. Choose foods such as meat, fish, milk and other low fat dairy products, vegetables, fruits, and whole-grain breads and cereals. Your caregiver will tell you of the ideal weight gain.  A physical sexual relationship may be continued up until near the end of pregnancy if there are no other problems. Problems could include early (premature) leaking of amniotic fluid from the membranes, vaginal bleeding, abdominal pain, or other medical or pregnancy problems.  Exercise regularly if there are no restrictions. Check with your caregiver if you are unsure of the safety of some of your exercises. The greatest weight gain will occur in the last 2 trimesters of pregnancy. Exercise will help you:  Control your weight.  Get you in shape for labor and delivery.  Lose weight   after you have the baby.  Wear a good support or jogging bra for breast tenderness during pregnancy. This may help if worn during sleep. Pads or tissues may be used in the bra if you are leaking colostrum.  Do not use hot tubs, steam rooms or saunas throughout the pregnancy.  Wear your seat belt at all times when driving. This protects you and your baby if you are in an accident.  Avoid raw meat, uncooked cheese, cat litter boxes and soil used by cats. These carry germs that can cause birth defects in the baby.  The second trimester is also a good time to visit your dentist for your dental health if this has not been done yet. Getting your teeth cleaned is OK. Use a soft toothbrush. Brush gently during pregnancy.  It is easier to loose urine during pregnancy. Tightening up and strengthening the pelvic muscles will help with this problem. Practice stopping  your urination while you are going to the bathroom. These are the same muscles you need to strengthen. It is also the muscles you would use as if you were trying to stop from passing gas. You can practice tightening these muscles up 10 times a set and repeating this about 3 times per day. Once you know what muscles to tighten up, do not perform these exercises during urination. It is more likely to contribute to an infection by backing up the urine.  Ask for help if you have financial, counseling or nutritional needs during pregnancy. Your caregiver will be able to offer counseling for these needs as well as refer you for other special needs.  Your skin may become oily. If so, wash your face with mild soap, use non-greasy moisturizer and oil or cream based makeup. MEDICATIONS AND DRUG USE IN PREGNANCY  Take prenatal vitamins as directed. The vitamin should contain 1 milligram of folic acid. Keep all vitamins out of reach of children. Only a couple vitamins or tablets containing iron may be fatal to a baby or young child when ingested.  Avoid use of all medications, including herbs, over-the-counter medications, not prescribed or suggested by your caregiver. Only take over-the-counter or prescription medicines for pain, discomfort, or fever as directed by your caregiver. Do not use aspirin.  Let your caregiver also know about herbs you may be using.  Alcohol is related to a number of birth defects. This includes fetal alcohol syndrome. All alcohol, in any form, should be avoided completely. Smoking will cause low birth rate and premature babies.  Street or illegal drugs are very harmful to the baby. They are absolutely forbidden. A baby born to an addicted mother will be addicted at birth. The baby will go through the same withdrawal an adult does. SEEK MEDICAL CARE IF:  You have any concerns or worries during your pregnancy. It is better to call with your questions if you feel they cannot wait,  rather than worry about them. SEEK IMMEDIATE MEDICAL CARE IF:   An unexplained oral temperature above 100.4 F (38 C) develops, or as your caregiver suggests.  You have leaking of fluid from the vagina (birth canal). If leaking membranes are suspected, take your temperature and tell your caregiver of this when you call.  There is vaginal spotting, bleeding, or passing clots. Tell your caregiver of the amount and how many pads are used. Light spotting in pregnancy is common, especially following intercourse.  You develop a bad smelling vaginal discharge with a change in the color from clear   to white.  You continue to feel sick to your stomach (nauseated) and have no relief from remedies suggested. You vomit blood or coffee ground-like materials.  You lose more than 2 pounds of weight or gain more than 2 pounds of weight over 1 week, or as suggested by your caregiver.  You notice swelling of your face, hands, feet, or legs.  You get exposed to German measles and have never had them.  You are exposed to fifth disease or chickenpox.  You develop belly (abdominal) pain. Round ligament discomfort is a common non-cancerous (benign) cause of abdominal pain in pregnancy. Your caregiver still must evaluate you.  You develop a bad headache that does not go away.  You develop fever, diarrhea, pain with urination, or shortness of breath.  You develop visual problems, blurry, or double vision.  You fall or are in a car accident or any kind of trauma.  There is mental or physical violence at home. Document Released: 02/16/2001 Document Revised: 05/17/2011 Document Reviewed: 08/21/2008 [employer] Patient Information 2013 [employer].  Contraception Choices Contraception (birth control) is the use of any methods or devices to prevent pregnancy. Below are some methods to help avoid pregnancy. HORMONAL METHODS   Contraceptive implant. This is a thin, plastic tube containing progesterone  hormone. It does not contain estrogen hormone. Your caregiver inserts the tube in the inner part of the upper arm. The tube can remain in place for up to 3 years. After 3 years, the implant must be removed. The implant prevents the ovaries from releasing an egg (ovulation), thickens the cervical mucus which prevents sperm from entering the uterus, and thins the lining of the inside of the uterus.  Progesterone-only injections. These injections are given every 3 months by your caregiver to prevent pregnancy. This synthetic progesterone hormone stops the ovaries from releasing eggs. It also thickens cervical mucus and changes the uterine lining. This makes it harder for sperm to survive in the uterus.  Birth control pills. These pills contain estrogen and progesterone hormone. They work by stopping the egg from forming in the ovary (ovulation). Birth control pills are prescribed by a caregiver.Birth control pills can also be used to treat heavy periods.  Minipill. This type of birth control pill contains only the progesterone hormone. They are taken every day of each month and must be prescribed by your caregiver.  Birth control patch. The patch contains hormones similar to those in birth control pills. It must be changed once a week and is prescribed by a caregiver.  Vaginal ring. The ring contains hormones similar to those in birth control pills. It is left in the vagina for 3 weeks, removed for 1 week, and then a new one is put back in place. The patient must be comfortable inserting and removing the ring from the vagina.A caregiver's prescription is necessary.  Emergency contraception. Emergency contraceptives prevent pregnancy after unprotected sexual intercourse. This pill can be taken right after sex or up to 5 days after unprotected sex. It is most effective the sooner you take the pills after having sexual intercourse. Emergency contraceptive pills are available without a prescription. Check  with your pharmacist. Do not use emergency contraception as your only form of birth control. BARRIER METHODS   Female condom. This is a thin sheath (latex or rubber) that is worn over the penis during sexual intercourse. It can be used with spermicide to increase effectiveness.  Female condom. This is a soft, loose-fitting sheath that is put into the   vagina before sexual intercourse.  Diaphragm. This is a soft, latex, dome-shaped barrier that must be fitted by a caregiver. It is inserted into the vagina, along with a spermicidal jelly. It is inserted before intercourse. The diaphragm should be left in the vagina for 6 to 8 hours after intercourse.  Cervical cap. This is a round, soft, latex or plastic cup that fits over the cervix and must be fitted by a caregiver. The cap can be left in place for up to 48 hours after intercourse.  Sponge. This is a soft, circular piece of polyurethane foam. The sponge has spermicide in it. It is inserted into the vagina after wetting it and before sexual intercourse.  Spermicides. These are chemicals that kill or block sperm from entering the cervix and uterus. They come in the form of creams, jellies, suppositories, foam, or tablets. They do not require a prescription. They are inserted into the vagina with an applicator before having sexual intercourse. The process must be repeated every time you have sexual intercourse. INTRAUTERINE CONTRACEPTION  Intrauterine device (IUD). This is a T-shaped device that is put in a woman's uterus during a menstrual period to prevent pregnancy. There are 2 types:  Copper IUD. This type of IUD is wrapped in copper wire and is placed inside the uterus. Copper makes the uterus and fallopian tubes produce a fluid that kills sperm. It can stay in place for 10 years.  Hormone IUD. This type of IUD contains the hormone progestin (synthetic progesterone). The hormone thickens the cervical mucus and prevents sperm from entering the  uterus, and it also thins the uterine lining to prevent implantation of a fertilized egg. The hormone can weaken or kill the sperm that get into the uterus. It can stay in place for 5 years. PERMANENT METHODS OF CONTRACEPTION  Female tubal ligation. This is when the woman's fallopian tubes are surgically sealed, tied, or blocked to prevent the egg from traveling to the uterus.  Female sterilization. This is when the female has the tubes that carry sperm tied off (vasectomy).This blocks sperm from entering the vagina during sexual intercourse. After the procedure, the man can still ejaculate fluid (semen). NATURAL PLANNING METHODS  Natural family planning. This is not having sexual intercourse or using a barrier method (condom, diaphragm, cervical cap) on days the woman could become pregnant.  Calendar method. This is keeping track of the length of each menstrual cycle and identifying when you are fertile.  Ovulation method. This is avoiding sexual intercourse during ovulation.  Symptothermal method. This is avoiding sexual intercourse during ovulation, using a thermometer and ovulation symptoms.  Post-ovulation method. This is timing sexual intercourse after you have ovulated. Regardless of which type or method of contraception you choose, it is important that you use condoms to protect against the transmission of sexually transmitted diseases (STDs). Talk with your caregiver about which form of contraception is most appropriate for you. Document Released: 02/22/2005 Document Revised: 05/17/2011 Document Reviewed: 07/01/2010 [employer] Patient Information 2013 [employer].  Breastfeeding Deciding to breastfeed is one of the best choices you can make for you and your baby. The information that follows gives a brief overview of the benefits of breastfeeding as well as common topics surrounding breastfeeding. BENEFITS OF BREASTFEEDING For the baby  The first milk (colostrum) helps the baby's  digestive system function better.   There are antibodies in the mother's milk that help the baby fight off infections.   The baby has a lower incidence of asthma,   allergies, and sudden infant death syndrome (SIDS).   The nutrients in breast milk are better for the baby than infant formulas, and breast milk helps the baby's brain grow better.   Babies who breastfeed have less gas, colic, and constipation.  For the mother  Breastfeeding helps develop a very special bond between the mother and her baby.   Breastfeeding is convenient, always available at the correct temperature, and costs nothing.   Breastfeeding burns calories in the mother and helps her lose weight that was gained during pregnancy.   Breastfeeding makes the uterus contract back down to normal size faster and slows bleeding following delivery.   Breastfeeding mothers have a lower risk of developing breast cancer.  BREASTFEEDING FREQUENCY  A healthy, full-term baby may breastfeed as often as every hour or space his or her feedings to every 3 hours.   Watch your baby for signs of hunger. Nurse your baby if he or she shows signs of hunger. How often you nurse will vary from baby to baby.   Nurse as often as the baby requests, or when you feel the need to reduce the fullness of your breasts.   Awaken the baby if it has been 3 4 hours since the last feeding.   Frequent feeding will help the mother make more milk and will help prevent problems, such as sore nipples and engorgement of the breasts.  BABY'S POSITION AT THE BREAST  Whether lying down or sitting, be sure that the baby's tummy is facing your tummy.   Support the breast with 4 fingers underneath the breast and the thumb above. Make sure your fingers are well away from the nipple and baby's mouth.   Stroke the baby's lips gently with your finger or nipple.   When the baby's mouth is open wide enough, place all of your nipple and as much of the  areola as possible into your baby's mouth.   Pull the baby in close so the tip of the nose and the baby's cheeks touch the breast during the feeding.  FEEDINGS AND SUCTION  The length of each feeding varies from baby to baby and from feeding to feeding.   The baby must suck about 2 3 minutes for your milk to get to him or her. This is called a "let down." For this reason, allow the baby to feed on each breast as long as he or she wants. Your baby will end the feeding when he or she has received the right balance of nutrients.   To break the suction, put your finger into the corner of the baby's mouth and slide it between his or her gums before removing your breast from his or her mouth. This will help prevent sore nipples.  HOW TO TELL WHETHER YOUR BABY IS GETTING ENOUGH BREAST MILK. Wondering whether or not your baby is getting enough milk is a common concern among mothers. You can be assured that your baby is getting enough milk if:   Your baby is actively sucking and you hear swallowing.   Your baby seems relaxed and satisfied after a feeding.   Your baby nurses at least 8 12 times in a 24 hour time period. Nurse your baby until he or she unlatches or falls asleep at the first breast (at least 10 20 minutes), then offer the second side.   Your baby is wetting 5 6 disposable diapers (6 8 cloth diapers) in a 24 hour period by 5 6 days of age.     Your baby is having at least 3 4 stools every 24 hours for the first 6 weeks. The stool should be soft and yellow.   Your baby should gain 4 7 ounces per week after he or she is 4 days old.   Your breasts feel softer after nursing.  REDUCING BREAST ENGORGEMENT  In the first week after your baby is born, you may experience signs of breast engorgement. When breasts are engorged, they feel heavy, warm, full, and may be tender to the touch. You can reduce engorgement if you:   Nurse frequently, every 2 3 hours. Mothers who breastfeed  early and often have fewer problems with engorgement.   Place light ice packs on your breasts for 10 20 minutes between feedings. This reduces swelling. Wrap the ice packs in a lightweight towel to protect your skin. Bags of frozen vegetables work well for this purpose.   Take a warm shower or apply warm, moist heat to your breast for 5 10 minutes just before each feeding. This increases circulation and helps the milk flow.   Gently massage your breast before and during the feeding. Using your finger tips, massage from the chest wall towards your nipple in a circular motion.   Make sure that the baby empties at least one breast at every feeding before switching sides.   Use a breast pump to empty the breasts if your baby is sleepy or not nursing well. You may also want to pump if you are returning to work oryou feel you are getting engorged.   Avoid bottle feeds, pacifiers, or supplemental feedings of water or juice in place of breastfeeding. Breast milk is all the food your baby needs. It is not necessary for your baby to have water or formula. In fact, to help your breasts make more milk, it is best not to give your baby supplemental feedings during the early weeks.   Be sure the baby is latched on and positioned properly while breastfeeding.   Wear a supportive bra, avoiding underwire styles.   Eat a balanced diet with enough fluids.   Rest often, relax, and take your prenatal vitamins to prevent fatigue, stress, and anemia.  If you follow these suggestions, your engorgement should improve in 24 48 hours. If you are still experiencing difficulty, call your lactation consultant or caregiver.  CARING FOR YOURSELF Take care of your breasts  Bathe or shower daily.   Avoid using soap on your nipples.   Start feedings on your left breast at one feeding and on your right breast at the next feeding.   You will notice an increase in your milk supply 2 5 days after delivery.  You may feel some discomfort from engorgement, which makes your breasts very firm and often tender. Engorgement "peaks" out within 24 48 hours. In the meantime, apply warm moist towels to your breasts for 5 10 minutes before feeding. Gentle massage and expression of some milk before feeding will soften your breasts, making it easier for your baby to latch on.   Wear a well-fitting nursing bra, and air dry your nipples for a 3 4minutes after each feeding.   Only use cotton bra pads.   Only use pure lanolin on your nipples after nursing. You do not need to wash it off before feeding the baby again. Another option is to express a few drops of breast milk and gently massage it into your nipples.  Take care of yourself  Eat well-balanced meals and nutritious snacks.     Drinking milk, fruit juice, and water to satisfy your thirst (about 8 glasses a day).   Get plenty of rest.  Avoid foods that you notice affect the baby in a bad way.  SEEK MEDICAL CARE IF:   You have difficulty with breastfeeding and need help.   You have a hard, red, sore area on your breast that is accompanied by a fever.   Your baby is too sleepy to eat well or is having trouble sleeping.   Your baby is wetting less than 6 diapers a day, by 5 days of age.   Your baby's skin or white part of his or her eyes is more yellow than it was in the hospital.   You feel depressed.  Document Released: 02/22/2005 Document Revised: 08/24/2011 Document Reviewed: 05/23/2011 [employer] Patient Information 2013 [employer].  

## 2012-06-20 ENCOUNTER — Encounter: Payer: Self-pay | Admitting: Obstetrics and Gynecology

## 2012-06-20 LAB — GLUCOSE TOLERANCE, 1 HOUR (50G) W/O FASTING: Glucose, 1 Hour GTT: 116 mg/dL (ref 70–140)

## 2012-06-20 LAB — OBSTETRIC PANEL
Antibody Screen: NEGATIVE
Basophils Absolute: 0 10*3/uL (ref 0.0–0.1)
HCT: 35.2 % — ABNORMAL LOW (ref 36.0–46.0)
Hemoglobin: 11.7 g/dL — ABNORMAL LOW (ref 12.0–15.0)
Lymphocytes Relative: 21 % (ref 12–46)
Monocytes Absolute: 0.5 10*3/uL (ref 0.1–1.0)
Monocytes Relative: 8 % (ref 3–12)
Neutro Abs: 4.9 10*3/uL (ref 1.7–7.7)
RBC: 4.56 MIL/uL (ref 3.87–5.11)
RDW: 14.1 % (ref 11.5–15.5)
Rubella: 1.25 Index — ABNORMAL HIGH (ref <0.90)
WBC: 7.2 10*3/uL (ref 4.0–10.5)

## 2012-07-03 ENCOUNTER — Encounter: Payer: Self-pay | Admitting: *Deleted

## 2012-07-10 ENCOUNTER — Encounter: Payer: Medicaid Other | Admitting: Obstetrics and Gynecology

## 2012-07-17 ENCOUNTER — Ambulatory Visit (INDEPENDENT_AMBULATORY_CARE_PROVIDER_SITE_OTHER): Payer: Medicaid Other | Admitting: Obstetrics & Gynecology

## 2012-07-17 VITALS — BP 117/55 | Temp 97.0°F | Wt 146.5 lb

## 2012-07-17 DIAGNOSIS — O0993 Supervision of high risk pregnancy, unspecified, third trimester: Secondary | ICD-10-CM

## 2012-07-17 DIAGNOSIS — O09299 Supervision of pregnancy with other poor reproductive or obstetric history, unspecified trimester: Secondary | ICD-10-CM

## 2012-07-17 DIAGNOSIS — O09293 Supervision of pregnancy with other poor reproductive or obstetric history, third trimester: Secondary | ICD-10-CM

## 2012-07-17 LAB — POCT URINALYSIS DIP (DEVICE)
Ketones, ur: NEGATIVE mg/dL
Protein, ur: NEGATIVE mg/dL
Specific Gravity, Urine: 1.025 (ref 1.005–1.030)

## 2012-07-17 LAB — CBC
Hemoglobin: 11.4 g/dL — ABNORMAL LOW (ref 12.0–15.0)
MCH: 25.6 pg — ABNORMAL LOW (ref 26.0–34.0)
RBC: 4.45 MIL/uL (ref 3.87–5.11)

## 2012-07-17 NOTE — Patient Instructions (Signed)
Pregnancy - Third Trimester  The third trimester of pregnancy (the last 3 months) is a period of the most rapid growth for you and your baby. The baby approaches a length of 20 inches and a weight of 6 to 10 pounds. The baby is adding on fat and getting ready for life outside your body. While inside, babies have periods of sleeping and waking, suck their thumbs, and hiccups. You can often feel small contractions of the uterus. This is false labor. It is also called Braxton-Hicks contractions. This is like a practice for labor. The usual problems in this stage of pregnancy include more difficulty breathing, swelling of the hands and feet from water retention, and having to urinate more often because of the uterus and baby pressing on your bladder.   PRENATAL EXAMS  · Blood work may continue to be done during prenatal exams. These tests are done to check on your health and the probable health of your baby. Blood work is used to follow your blood levels (hemoglobin). Anemia (low hemoglobin) is common during pregnancy. Iron and vitamins are given to help prevent this. You may also continue to be checked for diabetes. Some of the past blood tests may be done again.  · The size of the uterus is measured during each visit. This makes sure your baby is growing properly according to your pregnancy dates.  · Your blood pressure is checked every prenatal visit. This is to make sure you are not getting toxemia.  · Your urine is checked every prenatal visit for infection, diabetes and protein.  · Your weight is checked at each visit. This is done to make sure gains are happening at the suggested rate and that you and your baby are growing normally.  · Sometimes, an ultrasound is performed to confirm the position and the proper growth and development of the baby. This is a test done that bounces harmless sound waves off the baby so your caregiver can more accurately determine due dates.  · Discuss the type of pain medication and  anesthesia you will have during your labor and delivery.  · Discuss the possibility and anesthesia if a Cesarean Section might be necessary.  · Inform your caregiver if there is any mental or physical violence at home.  Sometimes, a specialized non-stress test, contraction stress test and biophysical profile are done to make sure the baby is not having a problem. Checking the amniotic fluid surrounding the baby is called an amniocentesis. The amniotic fluid is removed by sticking a needle into the belly (abdomen). This is sometimes done near the end of pregnancy if an early delivery is required. In this case, it is done to help make sure the baby's lungs are mature enough for the baby to live outside of the womb. If the lungs are not mature and it is unsafe to deliver the baby, an injection of cortisone medication is given to the mother 1 to 2 days before the delivery. This helps the baby's lungs mature and makes it safer to deliver the baby.  CHANGES OCCURING IN THE THIRD TRIMESTER OF PREGNANCY  Your body goes through many changes during pregnancy. They vary from person to person. Talk to your caregiver about changes you notice and are concerned about.  · During the last trimester, you have probably had an increase in your appetite. It is normal to have cravings for certain foods. This varies from person to person and pregnancy to pregnancy.  · You may begin to   get stretch marks on your hips, abdomen, and breasts. These are normal changes in the body during pregnancy. There are no exercises or medications to take which prevent this change.  · Constipation may be treated with a stool softener or adding bulk to your diet. Drinking lots of fluids, fiber in vegetables, fruits, and whole grains are helpful.  · Exercising is also helpful. If you have been very active up until your pregnancy, most of these activities can be continued during your pregnancy. If you have been less active, it is helpful to start an exercise  program such as walking. Consult your caregiver before starting exercise programs.  · Avoid all smoking, alcohol, un-prescribed drugs, herbs and "street drugs" during your pregnancy. These chemicals affect the formation and growth of the baby. Avoid chemicals throughout the pregnancy to ensure the delivery of a healthy infant.  · Backache, varicose veins and hemorrhoids may develop or get worse.  · You will tire more easily in the third trimester, which is normal.  · The baby's movements may be stronger and more often.  · You may become short of breath easily.  · Your belly button may stick out.  · A yellow discharge may leak from your breasts called colostrum.  · You may have a bloody mucus discharge. This usually occurs a few days to a week before labor begins.  HOME CARE INSTRUCTIONS   · Keep your caregiver's appointments. Follow your caregiver's instructions regarding medication use, exercise, and diet.  · During pregnancy, you are providing food for you and your baby. Continue to eat regular, well-balanced meals. Choose foods such as meat, fish, milk and other low fat dairy products, vegetables, fruits, and whole-grain breads and cereals. Your caregiver will tell you of the ideal weight gain.  · A physical sexual relationship may be continued throughout pregnancy if there are no other problems such as early (premature) leaking of amniotic fluid from the membranes, vaginal bleeding, or belly (abdominal) pain.  · Exercise regularly if there are no restrictions. Check with your caregiver if you are unsure of the safety of your exercises. Greater weight gain will occur in the last 2 trimesters of pregnancy. Exercising helps:  · Control your weight.  · Get you in shape for labor and delivery.  · You lose weight after you deliver.  · Rest a lot with legs elevated, or as needed for leg cramps or low back pain.  · Wear a good support or jogging bra for breast tenderness during pregnancy. This may help if worn during  sleep. Pads or tissues may be used in the bra if you are leaking colostrum.  · Do not use hot tubs, steam rooms, or saunas.  · Wear your seat belt when driving. This protects you and your baby if you are in an accident.  · Avoid raw meat, cat litter boxes and soil used by cats. These carry germs that can cause birth defects in the baby.  · It is easier to loose urine during pregnancy. Tightening up and strengthening the pelvic muscles will help with this problem. You can practice stopping your urination while you are going to the bathroom. These are the same muscles you need to strengthen. It is also the muscles you would use if you were trying to stop from passing gas. You can practice tightening these muscles up 10 times a set and repeating this about 3 times per day. Once you know what muscles to tighten up, do not perform these   exercises during urination. It is more likely to cause an infection by backing up the urine.  · Ask for help if you have financial, counseling or nutritional needs during pregnancy. Your caregiver will be able to offer counseling for these needs as well as refer you for other special needs.  · Make a list of emergency phone numbers and have them available.  · Plan on getting help from family or friends when you go home from the hospital.  · Make a trial run to the hospital.  · Take prenatal classes with the father to understand, practice and ask questions about the labor and delivery.  · Prepare the baby's room/nursery.  · Do not travel out of the city unless it is absolutely necessary and with the advice of your caregiver.  · Wear only low or no heal shoes to have better balance and prevent falling.  MEDICATIONS AND DRUG USE IN PREGNANCY  · Take prenatal vitamins as directed. The vitamin should contain 1 milligram of folic acid. Keep all vitamins out of reach of children. Only a couple vitamins or tablets containing iron may be fatal to a baby or young child when ingested.  · Avoid use  of all medications, including herbs, over-the-counter medications, not prescribed or suggested by your caregiver. Only take over-the-counter or prescription medicines for pain, discomfort, or fever as directed by your caregiver. Do not use aspirin, ibuprofen (Motrin®, Advil®, Nuprin®) or naproxen (Aleve®) unless OK'd by your caregiver.  · Let your caregiver also know about herbs you may be using.  · Alcohol is related to a number of birth defects. This includes fetal alcohol syndrome. All alcohol, in any form, should be avoided completely. Smoking will cause low birth rate and premature babies.  · Street/illegal drugs are very harmful to the baby. They are absolutely forbidden. A baby born to an addicted mother will be addicted at birth. The baby will go through the same withdrawal an adult does.  SEEK MEDICAL CARE IF:  You have any concerns or worries during your pregnancy. It is better to call with your questions if you feel they cannot wait, rather than worry about them.  DECISIONS ABOUT CIRCUMCISION  You may or may not know the sex of your baby. If you know your baby is a boy, it may be time to think about circumcision. Circumcision is the removal of the foreskin of the penis. This is the skin that covers the sensitive end of the penis. There is no proven medical need for this. Often this decision is made on what is popular at the time or based upon religious beliefs and social issues. You can discuss these issues with your caregiver or pediatrician.  SEEK IMMEDIATE MEDICAL CARE IF:   · An unexplained oral temperature above 102° F (38.9° C) develops, or as your caregiver suggests.  · You have leaking of fluid from the vagina (birth canal). If leaking membranes are suspected, take your temperature and tell your caregiver of this when you call.  · There is vaginal spotting, bleeding or passing clots. Tell your caregiver of the amount and how many pads are used.  · You develop a bad smelling vaginal discharge with  a change in the color from clear to white.  · You develop vomiting that lasts more than 24 hours.  · You develop chills or fever.  · You develop shortness of breath.  · You develop burning on urination.  · You loose more than 2 pounds of weight   or gain more than 2 pounds of weight or as suggested by your caregiver.  · You notice sudden swelling of your face, hands, and feet or legs.  · You develop belly (abdominal) pain. Round ligament discomfort is a common non-cancerous (benign) cause of abdominal pain in pregnancy. Your caregiver still must evaluate you.  · You develop a severe headache that does not go away.  · You develop visual problems, blurred or double vision.  · If you have not felt your baby move for more than 1 hour. If you think the baby is not moving as much as usual, eat something with sugar in it and lie down on your left side for an hour. The baby should move at least 4 to 5 times per hour. Call right away if your baby moves less than that.  · You fall, are in a car accident or any kind of trauma.  · There is mental or physical violence at home.  Document Released: 02/16/2001 Document Revised: 05/17/2011 Document Reviewed: 08/21/2008  [employer]® Patient Information ©2013 [employer].

## 2012-07-17 NOTE — Progress Notes (Signed)
U/S scheduled 07/20/12 at 145 pm.

## 2012-07-17 NOTE — Progress Notes (Signed)
Good fetal movement, no problemsGuidelines for Antenatal Testing and Sonography  (Revised 05/2012)  INDICATION U/S NST/BPP DELIVERY  Diabetes   A1 - good control - 648.83    A2 - good control    Poor control or poor compliance    (Macrosomia or polyhydramnios)    B-C and A2/B - 648.03    D-R-F-T or poor control B-C  20-38  20-38  20-26-30-34-38   20-24-28-32-35-38//fetal echo  20-24-27-30-33-36-38//fetal echo  40  32//2 x wk  32//2 x wk   32//2 x wk  28//BPP wkly then 32//2 x wk  40  39  FLM   39  FLM  CHTN - 642.03   Group I   BP < 140/90, no PIH, AGA,  nml AFV, +/- meds     Group II   BP > 140/90, on meds, no PIH, AGA, nml AFV  20-28-34-38  20-24-28-31-34-37  32//2 x wk  28//BPP wkly then 32//2 x wk  40 no meds; 39 meds  FLM or 39  Pre-eclampsia   Mild - 642.43    Severe - 642.53  Q 2 wks   Q 2 wks  28//BPP wkly then 32//2 x wk  Inpatient  37   PRN  IUGR- 656.53   EFW < 10% w/ AEDF & low AFV or EFW < 3%     EFW < 10%, Nml Dopplers & AFV, no other comorbidities  PRN  28-30-32-34-36-38    Dx//2 x wk then 24//BPP wkly  PRN  39 or PRN  Graves Disease - 648.13 28-32-36 32//2 x wk 39  Multiple Gestation - 651.03       MC/DA    Concordant (< 20%) nml AFV, AGA   Discordant (>20%)               DC/DA   Concordant (<20%), nml AFV, AGA, no other comorbidities      Discordant (> 20%)     Q 2 wks 16-32, q 3wks 32-delivery Q 1 wk, Fluid alternating w/ growth   20-24-28-32-36  Q 2-3 wks   32//2 x wk 24//BPP wkly then 32//2 x wk   35//2 x wk  28//2 x wk   37-38 PRN   38  FLM or 38   Advanced maternal Age > 1 y.o. - 659.63 20-24-28-32-36 36//2 x wk 40  Previous Stillbirth (> 28 wks) - V23.5 20-24-28-32-36 28//BPP wkly then 32//2 x wk 39  Oligohydramnios - 658.03   AGA, nml anatomy      AFV 6-10       AFV < 5   Q 2-3 wks  Q 2 wks   28//BPP wkly then 32//2 x wk  28//BPP wkly then 32//2 x wk    39  FLM or 37  Cholestasis - 576.8 At dx, then q 2-3 wks 28//BPP wkly then 32//2 x wk FLM or 37  Polyhydramnios - 657.03   Nml anatomy Q 3 wks 28//BPP wkly then 32//2 x wk 39  Renal Disease - 646.23   Cr > 1.2, proteinuria 20-24-28-32-36 28//BPP wkly then 32//2 x wk 39  SLE (lupus) - 710.0 20-24-28-32-36 32//2 x wk 39  Sickle Cell Disease 20-24-28-32-36 32//2 x wk 39  HIV - 042, 647.63 20-26-32-36 PRN 39  Decreased Fetal Movement- 655.73  2 x wk PRN; BPP wkly if < 32 wks   **2 x wk testing = NST 2 x wk + AFI (modified BPP) wkly** **True BPP needed in pts. With multiple comorbidities**

## 2012-07-18 LAB — RPR

## 2012-07-18 LAB — GLUCOSE TOLERANCE, 1 HOUR (50G) W/O FASTING: Glucose, 1 Hour GTT: 94 mg/dL (ref 70–140)

## 2012-07-20 ENCOUNTER — Other Ambulatory Visit: Payer: Self-pay | Admitting: Obstetrics & Gynecology

## 2012-07-20 ENCOUNTER — Ambulatory Visit (HOSPITAL_COMMUNITY)
Admission: RE | Admit: 2012-07-20 | Discharge: 2012-07-20 | Disposition: A | Discharge status: Home / Self Care | Payer: Medicaid Other | Source: Ambulatory Visit | Attending: Obstetrics & Gynecology | Admitting: Obstetrics & Gynecology

## 2012-07-20 DIAGNOSIS — Z3689 Encounter for other specified antenatal screening: Secondary | ICD-10-CM | POA: Insufficient documentation

## 2012-07-20 DIAGNOSIS — O26843 Uterine size-date discrepancy, third trimester: Secondary | ICD-10-CM

## 2012-07-20 DIAGNOSIS — O09299 Supervision of pregnancy with other poor reproductive or obstetric history, unspecified trimester: Secondary | ICD-10-CM | POA: Insufficient documentation

## 2012-07-20 DIAGNOSIS — O09293 Supervision of pregnancy with other poor reproductive or obstetric history, third trimester: Secondary | ICD-10-CM

## 2012-07-21 ENCOUNTER — Ambulatory Visit (HOSPITAL_COMMUNITY): Admission: RE | Admit: 2012-07-21 | Payer: Medicaid Other | Source: Ambulatory Visit

## 2012-07-21 ENCOUNTER — Ambulatory Visit (HOSPITAL_COMMUNITY): Payer: Medicaid Other

## 2012-07-24 ENCOUNTER — Ambulatory Visit (HOSPITAL_COMMUNITY): Admission: RE | Admit: 2012-07-24 | Payer: Medicaid Other | Source: Ambulatory Visit

## 2012-08-07 ENCOUNTER — Ambulatory Visit (INDEPENDENT_AMBULATORY_CARE_PROVIDER_SITE_OTHER): Payer: Medicaid Other | Admitting: Family

## 2012-08-07 VITALS — BP 117/71 | Temp 97.3°F | Wt 150.7 lb

## 2012-08-07 DIAGNOSIS — O09299 Supervision of pregnancy with other poor reproductive or obstetric history, unspecified trimester: Secondary | ICD-10-CM

## 2012-08-07 DIAGNOSIS — O09293 Supervision of pregnancy with other poor reproductive or obstetric history, third trimester: Secondary | ICD-10-CM

## 2012-08-07 LAB — POCT URINALYSIS DIP (DEVICE)
Bilirubin Urine: NEGATIVE
Glucose, UA: NEGATIVE mg/dL
Hgb urine dipstick: NEGATIVE
Ketones, ur: NEGATIVE mg/dL
Specific Gravity, Urine: 1.025 (ref 1.005–1.030)

## 2012-08-07 NOTE — Progress Notes (Signed)
Pulse: 81

## 2012-08-07 NOTE — Progress Notes (Signed)
No questions or concerns; Last BPP on 5/15 8/8, missed appt due to not having phone on; schedule BPP asap; begin 2xweek testing starting next week.

## 2012-08-08 ENCOUNTER — Ambulatory Visit (HOSPITAL_COMMUNITY)
Admission: RE | Admit: 2012-08-08 | Discharge: 2012-08-08 | Disposition: A | Discharge status: Home / Self Care | Payer: Medicaid Other | Source: Ambulatory Visit | Attending: Family | Admitting: Family

## 2012-08-08 DIAGNOSIS — O0930 Supervision of pregnancy with insufficient antenatal care, unspecified trimester: Secondary | ICD-10-CM | POA: Insufficient documentation

## 2012-08-08 DIAGNOSIS — O09299 Supervision of pregnancy with other poor reproductive or obstetric history, unspecified trimester: Secondary | ICD-10-CM | POA: Insufficient documentation

## 2012-08-08 DIAGNOSIS — O09293 Supervision of pregnancy with other poor reproductive or obstetric history, third trimester: Secondary | ICD-10-CM

## 2012-08-08 NOTE — Progress Notes (Signed)
[facility] ultrasound  Indication: 22 yr old G3P2001 at [redacted]w[redacted]d with history of fetal demise for BPP.  Findings: 1. Single intrauterine pregnancy. 2. Anterior placenta without evidence of previa. 3. Normal amniotic fluid index. 4. The limited anatomy survey is normal. 5. Normal biophysical profile of 8/8.  Recommendations: 1. Previous fetal demise: - no consult requested - recommend continue antenatal testing - recommend fetal growth every 4 weeks; recommend obtain growth asap - options for delivery: by estimated due date but not prior to 39 weeks in the absence of other complications; delivery at 37-39 weeks with an amniocentesis positive for fetal lung maturity - recommend fetal kick counts  Eulis Foster, MD

## 2012-08-14 ENCOUNTER — Ambulatory Visit (INDEPENDENT_AMBULATORY_CARE_PROVIDER_SITE_OTHER): Payer: Medicaid Other | Admitting: Obstetrics & Gynecology

## 2012-08-14 VITALS — BP 108/67 | Temp 97.9°F | Wt 154.1 lb

## 2012-08-14 DIAGNOSIS — O0933 Supervision of pregnancy with insufficient antenatal care, third trimester: Secondary | ICD-10-CM

## 2012-08-14 DIAGNOSIS — O09293 Supervision of pregnancy with other poor reproductive or obstetric history, third trimester: Secondary | ICD-10-CM

## 2012-08-14 DIAGNOSIS — O0930 Supervision of pregnancy with insufficient antenatal care, unspecified trimester: Secondary | ICD-10-CM

## 2012-08-14 DIAGNOSIS — O09299 Supervision of pregnancy with other poor reproductive or obstetric history, unspecified trimester: Secondary | ICD-10-CM

## 2012-08-14 LAB — POCT URINALYSIS DIP (DEVICE)
Bilirubin Urine: NEGATIVE
Hgb urine dipstick: NEGATIVE
Ketones, ur: NEGATIVE mg/dL
Specific Gravity, Urine: 1.025 (ref 1.005–1.030)
pH: 6.5 (ref 5.0–8.0)

## 2012-08-14 NOTE — Progress Notes (Signed)
Pulse- 86 Patient reports upper abdominal pain in both quadrants; also reports a lot of white itchy d/c

## 2012-08-14 NOTE — Patient Instructions (Signed)
Return to clinic for any obstetric concerns or go to [facility] for evaluation  

## 2012-08-14 NOTE — Progress Notes (Signed)
Growth scan scheduled with MFM.  No other complaints or concerns.  Fetal movement and labor precautions reviewed. NST performed today was reviewed and was found to be reactive.  Continue recommended antenatal testing and prenatal care.

## 2012-08-14 NOTE — Progress Notes (Signed)
U/S scheduled with MFM 08/16/12 at 4 pm.

## 2012-08-15 ENCOUNTER — Other Ambulatory Visit: Payer: Self-pay | Admitting: Obstetrics & Gynecology

## 2012-08-15 DIAGNOSIS — O0930 Supervision of pregnancy with insufficient antenatal care, unspecified trimester: Secondary | ICD-10-CM

## 2012-08-16 ENCOUNTER — Ambulatory Visit (HOSPITAL_COMMUNITY): Payer: Medicaid Other | Attending: Obstetrics & Gynecology

## 2012-08-17 ENCOUNTER — Other Ambulatory Visit: Payer: Medicaid Other

## 2012-08-21 ENCOUNTER — Ambulatory Visit (INDEPENDENT_AMBULATORY_CARE_PROVIDER_SITE_OTHER): Payer: Medicaid Other | Admitting: Family Medicine

## 2012-08-21 VITALS — BP 102/63 | Temp 97.7°F | Wt 151.7 lb

## 2012-08-21 DIAGNOSIS — O09299 Supervision of pregnancy with other poor reproductive or obstetric history, unspecified trimester: Secondary | ICD-10-CM

## 2012-08-21 LAB — POCT URINALYSIS DIP (DEVICE)
Ketones, ur: NEGATIVE mg/dL
Protein, ur: NEGATIVE mg/dL
Specific Gravity, Urine: 1.025 (ref 1.005–1.030)
pH: 7 (ref 5.0–8.0)

## 2012-08-21 NOTE — Patient Instructions (Signed)
Pregnancy - Third Trimester The third trimester of pregnancy (the last 3 months) is a period of the most rapid growth for you and your baby. The baby approaches a length of 20 inches and a weight of 6 to 10 pounds. The baby is adding on fat and getting ready for life outside your body. While inside, babies have periods of sleeping and waking, sucking thumbs, and hiccuping. You can often feel small contractions of the uterus. This is false labor. It is also called Braxton-Hicks contractions. This is like a practice for labor. The usual problems in this stage of pregnancy include more difficulty breathing, swelling of the hands and feet from water retention, and having to urinate more often because of the uterus and baby pressing on your bladder.  PRENATAL EXAMS  Blood work may continue to be done during prenatal exams. These tests are done to check on your health and the probable health of your baby. Blood work is used to follow your blood levels (hemoglobin). Anemia (low hemoglobin) is common during pregnancy. Iron and vitamins are given to help prevent this. You may also continue to be checked for diabetes. Some of the past blood tests may be done again.  The size of the uterus is measured during each visit. This makes sure your baby is growing properly according to your pregnancy dates.  Your blood pressure is checked every prenatal visit. This is to make sure you are not getting toxemia.  Your urine is checked every prenatal visit for infection, diabetes, and protein.  Your weight is checked at each visit. This is done to make sure gains are happening at the suggested rate and that you and your baby are growing normally.  Sometimes, an ultrasound is performed to confirm the position and the proper growth and development of the baby. This is a test done that bounces harmless sound waves off the baby so your caregiver can more accurately determine a due date.  Discuss the type of pain medicine and  anesthesia you will have during your labor and delivery.  Discuss the possibility and anesthesia if a cesarean section might be necessary.  Inform your caregiver if there is any mental or physical violence at home. Sometimes, a specialized non-stress test, contraction stress test, and biophysical profile are done to make sure the baby is not having a problem. Checking the amniotic fluid surrounding the baby is called an amniocentesis. The amniotic fluid is removed by sticking a needle into the belly (abdomen). This is sometimes done near the end of pregnancy if an early delivery is required. In this case, it is done to help make sure the baby's lungs are mature enough for the baby to live outside of the womb. If the lungs are not mature and it is unsafe to deliver the baby, an injection of cortisone medicine is given to the mother 1 to 2 days before the delivery. This helps the baby's lungs mature and makes it safer to deliver the baby. CHANGES OCCURING IN THE THIRD TRIMESTER OF PREGNANCY Your body goes through many changes during pregnancy. They vary from person to person. Talk to your caregiver about changes you notice and are concerned about.  During the last trimester, you have probably had an increase in your appetite. It is normal to have cravings for certain foods. This varies from person to person and pregnancy to pregnancy.  You may begin to get stretch marks on your hips, abdomen, and breasts. These are normal changes in the body   during pregnancy. There are no exercises or medicines to take which prevent this change.  Constipation may be treated with a stool softener or adding bulk to your diet. Drinking lots of fluids, fiber in vegetables, fruits, and whole grains are helpful.  Exercising is also helpful. If you have been very active up until your pregnancy, most of these activities can be continued during your pregnancy. If you have been less active, it is helpful to start an exercise  program such as walking. Consult your caregiver before starting exercise programs.  Avoid all smoking, alcohol, non-prescribed drugs, herbs and "street drugs" during your pregnancy. These chemicals affect the formation and growth of the baby. Avoid chemicals throughout the pregnancy to ensure the delivery of a healthy infant.  Backache, varicose veins, and hemorrhoids may develop or get worse.  You will tire more easily in the third trimester, which is normal.  The baby's movements may be stronger and more often.  You may become short of breath easily.  Your belly button may stick out.  A yellow discharge may leak from your breasts called colostrum.  You may have a bloody mucus discharge. This usually occurs a few days to a week before labor begins. HOME CARE INSTRUCTIONS   Keep your caregiver's appointments. Follow your caregiver's instructions regarding medicine use, exercise, and diet.  During pregnancy, you are providing food for you and your baby. Continue to eat regular, well-balanced meals. Choose foods such as meat, fish, milk and other low fat dairy products, vegetables, fruits, and whole-grain breads and cereals. Your caregiver will tell you of the ideal weight gain.  A physical sexual relationship may be continued throughout pregnancy if there are no other problems such as early (premature) leaking of amniotic fluid from the membranes, vaginal bleeding, or belly (abdominal) pain.  Exercise regularly if there are no restrictions. Check with your caregiver if you are unsure of the safety of your exercises. Greater weight gain will occur in the last 2 trimesters of pregnancy. Exercising helps:  Control your weight.  Get you in shape for labor and delivery.  You lose weight after you deliver.  Rest a lot with legs elevated, or as needed for leg cramps or low back pain.  Wear a good support or jogging bra for breast tenderness during pregnancy. This may help if worn during  sleep. Pads or tissues may be used in the bra if you are leaking colostrum.  Do not use hot tubs, steam rooms, or saunas.  Wear your seat belt when driving. This protects you and your baby if you are in an accident.  Avoid raw meat, cat litter boxes and soil used by cats. These carry germs that can cause birth defects in the baby.  It is easier to leak urine during pregnancy. Tightening up and strengthening the pelvic muscles will help with this problem. You can practice stopping your urination while you are going to the bathroom. These are the same muscles you need to strengthen. It is also the muscles you would use if you were trying to stop from passing gas. You can practice tightening these muscles up 10 times a set and repeating this about 3 times per day. Once you know what muscles to tighten up, do not perform these exercises during urination. It is more likely to cause an infection by backing up the urine.  Ask for help if you have financial, counseling, or nutritional needs during pregnancy. Your caregiver will be able to offer counseling for these   needs as well as refer you for other special needs.  Make a list of emergency phone numbers and have them available.  Plan on getting help from family or friends when you go home from the hospital.  Make a trial run to the hospital.  Take prenatal classes with the father to understand, practice, and ask questions about the labor and delivery.  Prepare the baby's room or nursery.  Do not travel out of the city unless it is absolutely necessary and with the advice of your caregiver.  Wear only low or no heal shoes to have better balance and prevent falling. MEDICINES AND DRUG USE IN PREGNANCY  Take prenatal vitamins as directed. The vitamin should contain 1 milligram of folic acid. Keep all vitamins out of reach of children. Only a couple vitamins or tablets containing iron may be fatal to a baby or young child when ingested.  Avoid use  of all medicines, including herbs, over-the-counter medicines, not prescribed or suggested by your caregiver. Only take over-the-counter or prescription medicines for pain, discomfort, or fever as directed by your caregiver. Do not use aspirin, ibuprofen or naproxen unless approved by your caregiver.  Let your caregiver also know about herbs you may be using.  Alcohol is related to a number of birth defects. This includes fetal alcohol syndrome. All alcohol, in any form, should be avoided completely. Smoking will cause low birth rate and premature babies.  Illegal drugs are very harmful to the baby. They are absolutely forbidden. A baby born to an addicted mother will be addicted at birth. The baby will go through the same withdrawal an adult does. SEEK MEDICAL CARE IF: You have any concerns or worries during your pregnancy. It is better to call with your questions if you feel they cannot wait, rather than worry about them. SEEK IMMEDIATE MEDICAL CARE IF:   An unexplained oral temperature above 102 F (38.9 C) develops, or as your caregiver suggests.  You have leaking of fluid from the vagina. If leaking membranes are suspected, take your temperature and tell your caregiver of this when you call.  There is vaginal spotting, bleeding or passing clots. Tell your caregiver of the amount and how many pads are used.  You develop a bad smelling vaginal discharge with a change in the color from clear to white.  You develop vomiting that lasts more than 24 hours.  You develop chills or fever.  You develop shortness of breath.  You develop burning on urination.  You loose more than 2 pounds of weight or gain more than 2 pounds of weight or as suggested by your caregiver.  You notice sudden swelling of your face, hands, and feet or legs.  You develop belly (abdominal) pain. Round ligament discomfort is a common non-cancerous (benign) cause of abdominal pain in pregnancy. Your caregiver still  must evaluate you.  You develop a severe headache that does not go away.  You develop visual problems, blurred or double vision.  If you have not felt your baby move for more than 1 hour. If you think the baby is not moving as much as usual, eat something with sugar in it and lie down on your left side for an hour. The baby should move at least 4 to 5 times per hour. Call right away if your baby moves less than that.  You fall, are in a car accident, or any kind of trauma.  There is mental or physical violence at home. Document Released: 02/16/2001   Document Revised: 11/17/2011 Document Reviewed: 08/21/2008 [employer] Patient Information 2014 [employer].  Breastfeeding A change in hormones during your pregnancy causes growth of your breast tissue and an increase in number and size of milk ducts. The hormone prolactin allows proteins, sugars, and fats from your blood supply to make breast milk in your milk-producing glands. The hormone progesterone prevents breast milk from being released before the birth of your baby. After the birth of your baby, your progesterone level decreases allowing breast milk to be released. Thoughts of your baby, as well as his or her sucking or crying, can stimulate the release of milk from the milk-producing glands. Deciding to breastfeed (nurse) is one of the best choices you can make for you and your baby. The information that follows gives a brief review of the benefits, as well as other important skills to know about breastfeeding. BENEFITS OF BREASTFEEDING For your baby  The first milk (colostrum) helps your baby's digestive system function better.   There are antibodies in your milk that help your baby fight off infections.   Your baby has a lower incidence of asthma, allergies, and sudden infant death syndrome (SIDS).   The nutrients in breast milk are better for your baby than infant formulas.  Breast milk improves your baby's brain development.    Your baby will have less gas, colic, and constipation.  Your baby is less likely to develop other conditions, such as childhood obesity, asthma, or diabetes mellitus. For you  Breastfeeding helps develop a very special bond between you and your baby.   Breastfeeding is convenient, always available at the correct temperature, and costs nothing.   Breastfeeding helps to burn calories and helps you lose the weight gained during pregnancy.   Breastfeeding makes your uterus contract back down to normal size faster and slows bleeding following delivery.   Breastfeeding mothers have a lower risk of developing osteoporosis or breast or ovarian cancer later in life.  BREASTFEEDING FREQUENCY  A healthy, full-term baby may breastfeed as often as every hour or space his or her feedings to every 3 hours. Breastfeeding frequency will vary from baby to baby.   Newborns should be fed no less than every 2 3 hours during the day and every 4 5 hours during the night. You should breastfeed a minimum of 8 feedings in a 24 hour period.  Awaken your baby to breastfeed if it has been 3 4 hours since the last feeding.  Breastfeed when you feel the need to reduce the fullness of your breasts or when your newborn shows signs of hunger. Signs that your baby may be hungry include:  Increased alertness or activity.  Stretching.  Movement of the head from side to side.  Movement of the head and opening of the mouth when the corner of the mouth or cheek is stroked (rooting).  Increased sucking sounds, smacking lips, cooing, sighing, or squeaking.  Hand-to-mouth movements.  Increased sucking of fingers or hands.  Fussing.  Intermittent crying.  Signs of extreme hunger will require calming and consoling before you try to feed your baby. Signs of extreme hunger may include:  Restlessness.  A loud, strong cry.  Screaming.  Frequent feeding will help you make more milk and will help prevent  problems, such as sore nipples and engorgement of the breasts.  BREASTFEEDING   Whether lying down or sitting, be sure that the baby's abdomen is facing your abdomen.   Support your breast with 4 fingers under your breast   and your thumb above your nipple. Make sure your fingers are well away from your nipple and your baby's mouth.   Stroke your baby's lips gently with your finger or nipple.   When your baby's mouth is open wide enough, place all of your nipple and as much of the colored area around your nipple (areola) as possible into your baby's mouth.  More areola should be visible above his or her upper lip than below his or her lower lip.  Your baby's tongue should be between his or her lower gum and your breast.  Ensure that your baby's mouth is correctly positioned around the nipple (latched). Your baby's lips should create a seal on your breast.  Signs that your baby has effectively latched onto your nipple include:  Tugging or sucking without pain.  Swallowing heard between sucks.  Absent click or smacking sound.  Muscle movement above and in front of his or her ears with sucking.  Your baby must suck about 2 3 minutes in order to get your milk. Allow your baby to feed on each breast as long as he or she wants. Nurse your baby until he or she unlatches or falls asleep at the first breast, then offer the second breast.  Signs that your baby is full and satisfied include:  A gradual decrease in the number of sucks or complete cessation of sucking.  Falling asleep.  Extension or relaxation of his or her body.  Retention of a small amount of milk in his or her mouth.  Letting go of your breast by himself or herself.  Signs of effective breastfeeding in you include:  Breasts that have increased firmness, weight, and size prior to feeding.  Breasts that are softer after nursing.  Increased milk volume, as well as a change in milk consistency and color by the 5th  day of breastfeeding.  Breast fullness relieved by breastfeeding.  Nipples are not sore, cracked, or bleeding.  If needed, break the suction by putting your finger into the corner of your baby's mouth and sliding your finger between his or her gums. Then, remove your breast from his or her mouth.  It is common for babies to spit up a small amount after a feeding.  Babies often swallow air during feeding. This can make babies fussy. Burping your baby between breasts can help with this.  Vitamin D supplements are recommended for babies who get only breast milk.  Avoid using a pacifier during your baby's first 4 6 weeks.  Avoid supplemental feedings of water, formula, or juice in place of breastfeeding. Breast milk is all the food your baby needs. It is not necessary for your baby to have water or formula. Your breasts will make more milk if supplemental feedings are avoided during the early weeks. HOW TO TELL WHETHER YOUR BABY IS GETTING ENOUGH BREAST MILK Wondering whether or not your baby is getting enough milk is a common concern among mothers. You can be assured that your baby is getting enough milk if:   Your baby is actively sucking and you hear swallowing.   Your baby seems relaxed and satisfied after a feeding.   Your baby nurses at least 8 12 times in a 24 hour time period.  During the first 3 5 days of age:  Your baby is wetting at least 3 5 diapers in a 24 hour period. The urine should be clear and pale yellow.  Your baby is having at least 3 4 stools in   a 24 hour period. The stool should be soft and yellow.  At 5 7 days of age, your baby is having at least 3 6 stools in a 24 hour period. The stool should be seedy and yellow by 5 days of age.  Your baby has a weight loss less than 7 10% during the first 3 days of age.  Your baby does not lose weight after 3 7 days of age.  Your baby gains 4 7 ounces each week after he or she is 4 days of age.  Your baby gains weight  by 5 days of age and is back to birth weight within 2 weeks. ENGORGEMENT In the first week after your baby is born, you may experience extremely full breasts (engorgement). When engorged, your breasts may feel heavy, warm, or tender to the touch. Engorgement peaks within 24 48 hours after delivery of your baby.  Engorgement may be reduced by:  Continuing to breastfeed.  Increasing the frequency of breastfeeding.  Taking warm showers or applying warm, moist heat to your breasts just before each feeding. This increases circulation and helps the milk flow.   Gently massaging your breast before and during the feedings. With your fingertips, massage from your chest wall towards your nipple in a circular motion.   Ensuring that your baby empties at least one breast at every feeding. It also helps to start the next feeding on the opposite breast.   Expressing breast milk by hand or by using a breast pump to empty the breasts if your baby is sleepy, or not nursing well. You may also want to express milk if you are returning to work oryou feel you are getting engorged.  Ensuring your baby is latched on and positioned properly while breastfeeding. If you follow these suggestions, your engorgement should improve in 24 48 hours. If you are still experiencing difficulty, call your lactation consultant or caregiver.  CARING FOR YOURSELF Take care of your breasts.  Bathe or shower daily.   Avoid using soap on your nipples.   Wear a supportive bra. Avoid wearing underwire style bras.  Air dry your nipples for a 3 4minutes after each feeding.   Use only cotton bra pads to absorb breast milk leakage. Leaking of breast milk between feedings is normal.   Use only pure lanolin on your nipples after nursing. You do not need to wash it off before feeding your baby again. Another option is to express a few drops of breast milk and gently massage that milk into your nipples.  Continue breast  self-awareness checks. Take care of yourself.  Eat healthy foods. Alternate 3 meals with 3 snacks.  Avoid foods that you notice affect your baby in a bad way.  Drink milk, fruit juice, and water to satisfy your thirst (about 8 glasses a day).   Rest often, relax, and take your prenatal vitamins to prevent fatigue, stress, and anemia.  Avoid chewing and smoking tobacco.  Avoid alcohol and drug use.  Take over-the-counter and prescribed medicine only as directed by your caregiver or pharmacist. You should always check with your caregiver or pharmacist before taking any new medicine, vitamin, or herbal supplement.  Know that pregnancy is possible while breastfeeding. If desired, talk to your caregiver about family planning and safe birth control methods that may be used while breastfeeding. SEEK MEDICAL CARE IF:   You feel like you want to stop breastfeeding or have become frustrated with breastfeeding.  You have painful breasts or nipples.    Your nipples are cracked or bleeding.  Your breasts are red, tender, or warm.  You have a swollen area on either breast.  You have a fever or chills.  You have nausea or vomiting.  You have drainage from your nipples.  Your breasts do not become full before feedings by the 5th day after delivery.  You feel sad and depressed.  Your baby is too sleepy to eat well.  Your baby is having trouble sleeping.   Your baby is wetting less than 3 diapers in a 24 hour period.  Your baby has less than 3 stools in a 24 hour period.  Your baby's skin or the white part of his or her eyes becomes more yellow.   Your baby is not gaining weight by 5 days of age. MAKE SURE YOU:   Understand these instructions.  Will watch your condition.  Will get help right away if you are not doing well or get worse. Document Released: 02/22/2005 Document Revised: 11/17/2011 Document Reviewed: 09/29/2011 [employer] Patient Information 2014 [employer].  

## 2012-08-21 NOTE — Progress Notes (Signed)
Pulse- 65 Patient reports white itchy d/c

## 2012-08-21 NOTE — Progress Notes (Signed)
NST reviewed and reactive. Growth this week.

## 2012-08-24 ENCOUNTER — Encounter: Payer: Self-pay | Admitting: *Deleted

## 2012-08-25 ENCOUNTER — Other Ambulatory Visit: Payer: Medicaid Other

## 2012-08-28 ENCOUNTER — Ambulatory Visit (HOSPITAL_COMMUNITY)
Admission: RE | Admit: 2012-08-28 | Discharge: 2012-08-28 | Disposition: A | Discharge status: Home / Self Care | Payer: Medicaid Other | Source: Ambulatory Visit | Attending: Family Medicine | Admitting: Family Medicine

## 2012-08-28 ENCOUNTER — Encounter: Payer: Self-pay | Admitting: Obstetrics and Gynecology

## 2012-08-28 ENCOUNTER — Ambulatory Visit (INDEPENDENT_AMBULATORY_CARE_PROVIDER_SITE_OTHER): Payer: Medicaid Other | Admitting: Obstetrics and Gynecology

## 2012-08-28 VITALS — BP 105/63 | Temp 97.0°F | Wt 155.2 lb

## 2012-08-28 DIAGNOSIS — O0993 Supervision of high risk pregnancy, unspecified, third trimester: Secondary | ICD-10-CM

## 2012-08-28 DIAGNOSIS — O09293 Supervision of pregnancy with other poor reproductive or obstetric history, third trimester: Secondary | ICD-10-CM

## 2012-08-28 DIAGNOSIS — O0930 Supervision of pregnancy with insufficient antenatal care, unspecified trimester: Secondary | ICD-10-CM | POA: Insufficient documentation

## 2012-08-28 DIAGNOSIS — D573 Sickle-cell trait: Secondary | ICD-10-CM

## 2012-08-28 DIAGNOSIS — O09299 Supervision of pregnancy with other poor reproductive or obstetric history, unspecified trimester: Secondary | ICD-10-CM

## 2012-08-28 LAB — POCT URINALYSIS DIP (DEVICE)
Bilirubin Urine: NEGATIVE
Glucose, UA: NEGATIVE mg/dL
Hgb urine dipstick: NEGATIVE
Ketones, ur: NEGATIVE mg/dL
Nitrite: NEGATIVE
Protein, ur: NEGATIVE mg/dL
Specific Gravity, Urine: 1.02 (ref 1.005–1.030)
Specific Gravity, Urine: 1.02 (ref 1.005–1.030)
Urobilinogen, UA: 0.2 mg/dL (ref 0.0–1.0)
pH: 7 (ref 5.0–8.0)

## 2012-08-28 NOTE — Progress Notes (Signed)
Pulse: 81

## 2012-08-28 NOTE — Progress Notes (Signed)
NST reviewed and reactive. FM/PTL precautions reviewed. F/U growth ultrasound report of today.

## 2012-08-31 ENCOUNTER — Other Ambulatory Visit: Payer: Medicaid Other

## 2012-09-04 ENCOUNTER — Ambulatory Visit (INDEPENDENT_AMBULATORY_CARE_PROVIDER_SITE_OTHER): Payer: Medicaid Other | Admitting: Family Medicine

## 2012-09-04 VITALS — BP 116/66 | Temp 98.6°F | Wt 156.8 lb

## 2012-09-04 DIAGNOSIS — O09293 Supervision of pregnancy with other poor reproductive or obstetric history, third trimester: Secondary | ICD-10-CM

## 2012-09-04 DIAGNOSIS — O09299 Supervision of pregnancy with other poor reproductive or obstetric history, unspecified trimester: Secondary | ICD-10-CM

## 2012-09-04 LAB — POCT URINALYSIS DIP (DEVICE)
Hgb urine dipstick: NEGATIVE
Ketones, ur: NEGATIVE mg/dL
Protein, ur: NEGATIVE mg/dL
pH: 6.5 (ref 5.0–8.0)

## 2012-09-04 NOTE — Progress Notes (Signed)
NST reviewed and reactive. Testing remains reassuring. Cultures next week U/S for growth on Thurs.

## 2012-09-04 NOTE — Progress Notes (Signed)
Pulse- 78 Patient reports occasional nonpainful contractions

## 2012-09-04 NOTE — Patient Instructions (Addendum)
Contraception Choices  Contraception (birth control) is the use of any methods or devices to prevent pregnancy. Below are some methods to help avoid pregnancy.  HORMONAL METHODS   · Contraceptive implant. This is a thin, plastic tube containing progesterone hormone. It does not contain estrogen hormone. Your caregiver inserts the tube in the inner part of the upper arm. The tube can remain in place for up to 3 years. After 3 years, the implant must be removed. The implant prevents the ovaries from releasing an egg (ovulation), thickens the cervical mucus which prevents sperm from entering the uterus, and thins the lining of the inside of the uterus.  · Progesterone-only injections. These injections are given every 3 months by your caregiver to prevent pregnancy. This synthetic progesterone hormone stops the ovaries from releasing eggs. It also thickens cervical mucus and changes the uterine lining. This makes it harder for sperm to survive in the uterus.  · Birth control pills. These pills contain estrogen and progesterone hormone. They work by stopping the egg from forming in the ovary (ovulation). Birth control pills are prescribed by a caregiver. Birth control pills can also be used to treat heavy periods.  · Minipill. This type of birth control pill contains only the progesterone hormone. They are taken every day of each month and must be prescribed by your caregiver.  · Birth control patch. The patch contains hormones similar to those in birth control pills. It must be changed once a week and is prescribed by a caregiver.  · Vaginal ring. The ring contains hormones similar to those in birth control pills. It is left in the vagina for 3 weeks, removed for 1 week, and then a new one is put back in place. The patient must be comfortable inserting and removing the ring from the vagina. A caregiver's prescription is necessary.  · Emergency contraception. Emergency contraceptives prevent pregnancy after unprotected  sexual intercourse. This pill can be taken right after sex or up to 5 days after unprotected sex. It is most effective the sooner you take the pills after having sexual intercourse. Emergency contraceptive pills are available without a prescription. Check with your pharmacist. Do not use emergency contraception as your only form of birth control.  BARRIER METHODS   · Female condom. This is a thin sheath (latex or rubber) that is worn over the penis during sexual intercourse. It can be used with spermicide to increase effectiveness.  · Female condom. This is a soft, loose-fitting sheath that is put into the vagina before sexual intercourse.  · Diaphragm. This is a soft, latex, dome-shaped barrier that must be fitted by a caregiver. It is inserted into the vagina, along with a spermicidal jelly. It is inserted before intercourse. The diaphragm should be left in the vagina for 6 to 8 hours after intercourse.  · Cervical cap. This is a round, soft, latex or plastic cup that fits over the cervix and must be fitted by a caregiver. The cap can be left in place for up to 48 hours after intercourse.  · Sponge. This is a soft, circular piece of polyurethane foam. The sponge has spermicide in it. It is inserted into the vagina after wetting it and before sexual intercourse.  · Spermicides. These are chemicals that kill or block sperm from entering the cervix and uterus. They come in the form of creams, jellies, suppositories, foam, or tablets. They do not require a prescription. They are inserted into the vagina with an applicator before having sexual intercourse.   IUD). This is a T-shaped device that is put in a woman's uterus during a menstrual period to prevent pregnancy. There are 2 types:  Copper IUD. This type of IUD is wrapped in copper wire and is placed inside the uterus. Copper makes the uterus and  fallopian tubes produce a fluid that kills sperm. It can stay in place for 10 years.  Hormone IUD. This type of IUD contains the hormone progestin (synthetic progesterone). The hormone thickens the cervical mucus and prevents sperm from entering the uterus, and it also thins the uterine lining to prevent implantation of a fertilized egg. The hormone can weaken or kill the sperm that get into the uterus. It can stay in place for 5 years. PERMANENT METHODS OF CONTRACEPTION  Female tubal ligation. This is when the woman's fallopian tubes are surgically sealed, tied, or blocked to prevent the egg from traveling to the uterus.  Female sterilization. This is when the female has the tubes that carry sperm tied off (vasectomy).This blocks sperm from entering the vagina during sexual intercourse. After the procedure, the man can still ejaculate fluid (semen). NATURAL PLANNING METHODS  Natural family planning. This is not having sexual intercourse or using a barrier method (condom, diaphragm, cervical cap) on days the woman could become pregnant.  Calendar method. This is keeping track of the length of each menstrual cycle and identifying when you are fertile.  Ovulation method. This is avoiding sexual intercourse during ovulation.  Symptothermal method. This is avoiding sexual intercourse during ovulation, using a thermometer and ovulation symptoms.  Post-ovulation method. This is timing sexual intercourse after you have ovulated. Regardless of which type or method of contraception you choose, it is important that you use condoms to protect against the transmission of sexually transmitted diseases (STDs). Talk with your caregiver about which form of contraception is most appropriate for you. Document Released: 02/22/2005 Document Revised: 05/17/2011 Document Reviewed: 07/01/2010 [facility] Patient Information 2014 [place], [place].  Breastfeeding A change in hormones during your pregnancy causes growth of  your breast tissue and an increase in number and size of milk ducts. The hormone prolactin allows proteins, sugars, and fats from your blood supply to make breast milk in your milk-producing glands. The hormone progesterone prevents breast milk from being released before the birth of your baby. After the birth of your baby, your progesterone level decreases allowing breast milk to be released. Thoughts of your baby, as well as his or her sucking or crying, can stimulate the release of milk from the milk-producing glands. Deciding to breastfeed (nurse) is one of the best choices you can make for you and your baby. The information that follows gives a brief review of the benefits, as well as other important skills to know about breastfeeding. BENEFITS OF BREASTFEEDING For your baby  The first milk (colostrum) helps your baby's digestive system function better.   There are antibodies in your milk that help your baby fight off infections.   Your baby has a lower incidence of asthma, allergies, and sudden infant death syndrome (SIDS).   The nutrients in breast milk are better for your baby than infant formulas.  Breast milk improves your baby's brain development.   Your baby will have less gas, colic, and constipation.  Your baby is less likely to develop other conditions, such as childhood obesity, asthma, or diabetes mellitus. For you  Breastfeeding helps develop a very special bond between you and your baby.   Breastfeeding is convenient, always available at the correct  temperature, and costs nothing.   Breastfeeding helps to burn calories and helps you lose the weight gained during pregnancy.   Breastfeeding makes your uterus contract back down to normal size faster and slows bleeding following delivery.   Breastfeeding mothers have a lower risk of developing osteoporosis or breast or ovarian cancer later in life.  BREASTFEEDING FREQUENCY  A healthy, full-term baby may  breastfeed as often as every hour or space his or her feedings to every 3 hours. Breastfeeding frequency will vary from baby to baby.   Newborns should be fed no less than every 2 3 hours during the day and every 4 5 hours during the night. You should breastfeed a minimum of 8 feedings in a 24 hour period.  Awaken your baby to breastfeed if it has been 3 4 hours since the last feeding.  Breastfeed when you feel the need to reduce the fullness of your breasts or when your newborn shows signs of hunger. Signs that your baby may be hungry include:  Increased alertness or activity.  Stretching.  Movement of the head from side to side.  Movement of the head and opening of the mouth when the corner of the mouth or cheek is stroked (rooting).  Increased sucking sounds, smacking lips, cooing, sighing, or squeaking.  Hand-to-mouth movements.  Increased sucking of fingers or hands.  Fussing.  Intermittent crying.  Signs of extreme hunger will require calming and consoling before you try to feed your baby. Signs of extreme hunger may include:  Restlessness.  A loud, strong cry.  Screaming.  Frequent feeding will help you make more milk and will help prevent problems, such as sore nipples and engorgement of the breasts.  BREASTFEEDING   Whether lying down or sitting, be sure that the baby's abdomen is facing your abdomen.   Support your breast with 4 fingers under your breast and your thumb above your nipple. Make sure your fingers are well away from your nipple and your baby's mouth.   Stroke your baby's lips gently with your finger or nipple.   When your baby's mouth is open wide enough, place all of your nipple and as much of the colored area around your nipple (areola) as possible into your baby's mouth.  More areola should be visible above his or her upper lip than below his or her lower lip.  Your baby's tongue should be between his or her lower gum and your  breast.  Ensure that your baby's mouth is correctly positioned around the nipple (latched). Your baby's lips should create a seal on your breast.  Signs that your baby has effectively latched onto your nipple include:  Tugging or sucking without pain.  Swallowing heard between sucks.  Absent click or smacking sound.  Muscle movement above and in front of his or her ears with sucking.  Your baby must suck about 2 3 minutes in order to get your milk. Allow your baby to feed on each breast as long as he or she wants. Nurse your baby until he or she unlatches or falls asleep at the first breast, then offer the second breast.  Signs that your baby is full and satisfied include:  A gradual decrease in the number of sucks or complete cessation of sucking.  Falling asleep.  Extension or relaxation of his or her body.  Retention of a small amount of milk in his or her mouth.  Letting go of your breast by himself or herself.  Signs of effective  breastfeeding in you include:  Breasts that have increased firmness, weight, and size prior to feeding.  Breasts that are softer after nursing.  Increased milk volume, as well as a change in milk consistency and color by the 5th day of breastfeeding.  Breast fullness relieved by breastfeeding.  Nipples are not sore, cracked, or bleeding.  If needed, break the suction by putting your finger into the corner of your baby's mouth and sliding your finger between his or her gums. Then, remove your breast from his or her mouth.  It is common for babies to spit up a small amount after a feeding.  Babies often swallow air during feeding. This can make babies fussy. Burping your baby between breasts can help with this.  Vitamin D supplements are recommended for babies who get only breast milk.  Avoid using a pacifier during your baby's first 4 6 weeks.  Avoid supplemental feedings of water, formula, or juice in place of breastfeeding. Breast milk  is all the food your baby needs. It is not necessary for your baby to have water or formula. Your breasts will make more milk if supplemental feedings are avoided during the early weeks. HOW TO TELL WHETHER YOUR BABY IS GETTING ENOUGH BREAST MILK Wondering whether or not your baby is getting enough milk is a common concern among mothers. You can be assured that your baby is getting enough milk if:   Your baby is actively sucking and you hear swallowing.   Your baby seems relaxed and satisfied after a feeding.   Your baby nurses at least 8 12 times in a 24 hour time period.  During the first 85 78 days of age:  Your baby is wetting at least 3 5 diapers in a 24 hour period. The urine should be clear and pale yellow.  Your baby is having at least 3 4 stools in a 24 hour period. The stool should be soft and yellow.  At 56 25 days of age, your baby is having at least 3 6 stools in a 24 hour period. The stool should be seedy and yellow by 68 days of age.  Your baby has a weight loss less than 7 10% during the first 15 days of age.  Your baby does not lose weight after 56 41 days of age.  Your baby gains 4 7 ounces each week after he or she is 51 days of age.  Your baby gains weight by 70 days of age and is back to birth weight within 2 weeks. ENGORGEMENT In the first week after your baby is born, you may experience extremely full breasts (engorgement). When engorged, your breasts may feel heavy, warm, or tender to the touch. Engorgement peaks within 24 48 hours after delivery of your baby.  Engorgement may be reduced by:  Continuing to breastfeed.  Increasing the frequency of breastfeeding.  Taking warm showers or applying warm, moist heat to your breasts just before each feeding. This increases circulation and helps the milk flow.   Gently massaging your breast before and during the feedings. With your fingertips, massage from your chest wall towards your nipple in a circular motion.    Ensuring that your baby empties at least one breast at every feeding. It also helps to start the next feeding on the opposite breast.   Expressing breast milk by hand or by using a breast pump to empty the breasts if your baby is sleepy, or not nursing well. You may also want to  express milk if you are returning to work oryou feel you are getting engorged.  Ensuring your baby is latched on and positioned properly while breastfeeding. If you follow these suggestions, your engorgement should improve in 24 48 hours. If you are still experiencing difficulty, call your lactation consultant or caregiver.  CARING FOR YOURSELF Take care of your breasts.  Bathe or shower daily.   Avoid using soap on your nipples.   Wear a supportive bra. Avoid wearing underwire style bras.  Air dry your nipples for a 3 after each feeding.   Use only cotton bra pads to absorb breast milk leakage. Leaking of breast milk between feedings is normal.   Use only pure lanolin on your nipples after nursing. You do not need to wash it off before feeding your baby again. Another option is to express a few drops of breast milk and gently massage that milk into your nipples.  Continue breast self-awareness checks. Take care of yourself.  Eat healthy foods. Alternate 3 meals with 3 snacks.  Avoid foods that you notice affect your baby in a bad way.  Drink milk, fruit juice, and water to satisfy your thirst (about 8 glasses a day).   Rest often, relax, and take your prenatal vitamins to prevent fatigue, stress, and anemia.  Avoid chewing and smoking tobacco.  Avoid alcohol and drug use.  Take over-the-counter and prescribed medicine only as directed by your caregiver or pharmacist. You should always check with your caregiver or pharmacist before taking any new medicine, vitamin, or herbal supplement.  Know that pregnancy is possible while breastfeeding. If desired, talk to your caregiver about  family planning and safe birth control methods that may be used while breastfeeding. SEEK MEDICAL CARE IF:   You feel like you want to stop breastfeeding or have become frustrated with breastfeeding.  You have painful breasts or nipples.  Your nipples are cracked or bleeding.  Your breasts are red, tender, or warm.  You have a swollen area on either breast.  You have a fever or chills.  You have nausea or vomiting.  You have drainage from your nipples.  Your breasts do not become full before feedings by the 5th day after delivery.  You feel sad and depressed.  Your baby is too sleepy to eat well.  Your baby is having trouble sleeping.   Your baby is wetting less than 3 diapers in a 24 hour period.  Your baby has less than 3 stools in a 24 hour period.  Your baby's skin or the white part of his or her eyes becomes more yellow.   Your baby is not gaining weight by 52 days of age. MAKE SURE YOU:   Understand these instructions.  Will watch your condition.  Will get help right away if you are not doing well or get worse. Document Released: 02/22/2005 Document Revised: 11/17/2011 Document Reviewed: 09/29/2011 [facility] Patient Information 2014 [place], [place]. Fetal Movement Counts Patient Name: __________________________________________________ Patient Due Date: ____________________ Performing a fetal movement count is highly recommended in high-risk pregnancies, but it is good for every pregnant woman to do. Your caregiver may ask you to start counting fetal movements at 28 weeks of the pregnancy. Fetal movements often increase:  After eating a full meal.  After physical activity.  After eating or drinking something sweet or cold.  At rest. Pay attention to when you feel the baby is most active. This will help you notice a pattern of your baby's sleep and  wake cycles and what factors contribute to an increase in fetal movement. It is important to perform a fetal  movement count at the same time each day when your baby is normally most active.  HOW TO COUNT FETAL MOVEMENTS 1. Find a quiet and comfortable area to sit or lie down on your left side. Lying on your left side provides the best blood and oxygen circulation to your baby. 2. Write down the day and time on a sheet of paper or in a journal. 3. Start counting kicks, flutters, swishes, rolls, or jabs in a 2 hour period. You should feel at least 10 movements within 2 hours. 4. If you do not feel 10 movements in 2 hours, wait 2 3 hours and count again. Look for a change in the pattern or not enough counts in 2 hours. SEEK MEDICAL CARE IF:  You feel less than 10 counts in 2 hours, tried twice.  There is no movement in over an hour.  The pattern is changing or taking longer each day to reach 10 counts in 2 hours.  You feel the baby is not moving as he or she usually does. Date: ____________ Movements: ____________ Start time: ____________ Doreatha Martin time: ____________  Date: ____________ Movements: ____________ Start time: ____________ Doreatha Martin time: ____________ Date: ____________ Movements: ____________ Start time: ____________ Doreatha Martin time: ____________ Date: ____________ Movements: ____________ Start time: ____________ Doreatha Martin time: ____________ Date: ____________ Movements: ____________ Start time: ____________ Doreatha Martin time: ____________ Date: ____________ Movements: ____________ Start time: ____________ Doreatha Martin time: ____________ Date: ____________ Movements: ____________ Start time: ____________ Doreatha Martin time: ____________ Date: ____________ Movements: ____________ Start time: ____________ Doreatha Martin time: ____________  Date: ____________ Movements: ____________ Start time: ____________ Doreatha Martin time: ____________ Date: ____________ Movements: ____________ Start time: ____________ Doreatha Martin time: ____________ Date: ____________ Movements: ____________ Start time: ____________ Doreatha Martin time: ____________ Date:  ____________ Movements: ____________ Start time: ____________ Doreatha Martin time: ____________ Date: ____________ Movements: ____________ Start time: ____________ Doreatha Martin time: ____________ Date: ____________ Movements: ____________ Start time: ____________ Doreatha Martin time: ____________ Date: ____________ Movements: ____________ Start time: ____________ Doreatha Martin time: ____________  Date: ____________ Movements: ____________ Start time: ____________ Doreatha Martin time: ____________ Date: ____________ Movements: ____________ Start time: ____________ Doreatha Martin time: ____________ Date: ____________ Movements: ____________ Start time: ____________ Doreatha Martin time: ____________ Date: ____________ Movements: ____________ Start time: ____________ Doreatha Martin time: ____________ Date: ____________ Movements: ____________ Start time: ____________ Doreatha Martin time: ____________ Date: ____________ Movements: ____________ Start time: ____________ Doreatha Martin time: ____________ Date: ____________ Movements: ____________ Start time: ____________ Doreatha Martin time: ____________  Date: ____________ Movements: ____________ Start time: ____________ Doreatha Martin time: ____________ Date: ____________ Movements: ____________ Start time: ____________ Doreatha Martin time: ____________ Date: ____________ Movements: ____________ Start time: ____________ Doreatha Martin time: ____________ Date: ____________ Movements: ____________ Start time: ____________ Doreatha Martin time: ____________ Date: ____________ Movements: ____________ Start time: ____________ Doreatha Martin time: ____________ Date: ____________ Movements: ____________ Start time: ____________ Doreatha Martin time: ____________ Date: ____________ Movements: ____________ Start time: ____________ Doreatha Martin time: ____________  Date: ____________ Movements: ____________ Start time: ____________ Doreatha Martin time: ____________ Date: ____________ Movements: ____________ Start time: ____________ Doreatha Martin time: ____________ Date: ____________ Movements: ____________ Start  time: ____________ Doreatha Martin time: ____________ Date: ____________ Movements: ____________ Start time: ____________ Doreatha Martin time: ____________ Date: ____________ Movements: ____________ Start time: ____________ Doreatha Martin time: ____________ Date: ____________ Movements: ____________ Start time: ____________ Doreatha Martin time: ____________ Date: ____________ Movements: ____________ Start time: ____________ Doreatha Martin time: ____________  Date: ____________ Movements: ____________ Start time: ____________ Doreatha Martin time: ____________ Date: ____________ Movements: ____________ Start time: ____________ Doreatha Martin time: ____________ Date: ____________ Movements: ____________ Start time: ____________ Doreatha Martin time: ____________ Date: ____________  Movements: ____________ Start time: ____________ Doreatha Martin time: ____________ Date: ____________ Movements: ____________ Start time: ____________ Doreatha Martin time: ____________ Date: ____________ Movements: ____________ Start time: ____________ Doreatha Martin time: ____________ Date: ____________ Movements: ____________ Start time: ____________ Doreatha Martin time: ____________  Date: ____________ Movements: ____________ Start time: ____________ Doreatha Martin time: ____________ Date: ____________ Movements: ____________ Start time: ____________ Doreatha Martin time: ____________ Date: ____________ Movements: ____________ Start time: ____________ Doreatha Martin time: ____________ Date: ____________ Movements: ____________ Start time: ____________ Doreatha Martin time: ____________ Date: ____________ Movements: ____________ Start time: ____________ Doreatha Martin time: ____________ Date: ____________ Movements: ____________ Start time: ____________ Doreatha Martin time: ____________ Date: ____________ Movements: ____________ Start time: ____________ Doreatha Martin time: ____________  Date: ____________ Movements: ____________ Start time: ____________ Doreatha Martin time: ____________ Date: ____________ Movements: ____________ Start time: ____________ Doreatha Martin time:  ____________ Date: ____________ Movements: ____________ Start time: ____________ Doreatha Martin time: ____________ Date: ____________ Movements: ____________ Start time: ____________ Doreatha Martin time: ____________ Date: ____________ Movements: ____________ Start time: ____________ Doreatha Martin time: ____________ Date: ____________ Movements: ____________ Start time: ____________ Doreatha Martin time: ____________ Document Released: 03/24/2006 Document Revised: 02/09/2012 Document Reviewed: 12/20/2011 [employer] Patient Information 2014 [employer]

## 2012-09-07 ENCOUNTER — Ambulatory Visit (INDEPENDENT_AMBULATORY_CARE_PROVIDER_SITE_OTHER): Payer: Medicaid Other | Admitting: *Deleted

## 2012-09-07 ENCOUNTER — Ambulatory Visit (HOSPITAL_COMMUNITY)
Admission: RE | Admit: 2012-09-07 | Discharge: 2012-09-07 | Disposition: A | Discharge status: Home / Self Care | Payer: Medicaid Other | Source: Ambulatory Visit | Attending: Family Medicine | Admitting: Family Medicine

## 2012-09-07 VITALS — BP 107/66

## 2012-09-07 DIAGNOSIS — O0930 Supervision of pregnancy with insufficient antenatal care, unspecified trimester: Secondary | ICD-10-CM | POA: Insufficient documentation

## 2012-09-07 DIAGNOSIS — O09299 Supervision of pregnancy with other poor reproductive or obstetric history, unspecified trimester: Secondary | ICD-10-CM | POA: Insufficient documentation

## 2012-09-07 DIAGNOSIS — O09293 Supervision of pregnancy with other poor reproductive or obstetric history, third trimester: Secondary | ICD-10-CM

## 2012-09-07 NOTE — Progress Notes (Signed)
P = 89    [place] for growth done today

## 2012-09-07 NOTE — Progress Notes (Signed)
NST performed today was reviewed and was found to be reactive.  Continue recommended antenatal testing and prenatal care.  

## 2012-09-11 ENCOUNTER — Ambulatory Visit (INDEPENDENT_AMBULATORY_CARE_PROVIDER_SITE_OTHER): Payer: Medicaid Other | Admitting: Obstetrics & Gynecology

## 2012-09-11 VITALS — BP 115/70 | Temp 98.1°F | Wt 160.1 lb

## 2012-09-11 DIAGNOSIS — O0933 Supervision of pregnancy with insufficient antenatal care, third trimester: Secondary | ICD-10-CM

## 2012-09-11 DIAGNOSIS — O0930 Supervision of pregnancy with insufficient antenatal care, unspecified trimester: Secondary | ICD-10-CM

## 2012-09-11 DIAGNOSIS — O09293 Supervision of pregnancy with other poor reproductive or obstetric history, third trimester: Secondary | ICD-10-CM

## 2012-09-11 DIAGNOSIS — O09299 Supervision of pregnancy with other poor reproductive or obstetric history, unspecified trimester: Secondary | ICD-10-CM

## 2012-09-11 LAB — POCT URINALYSIS DIP (DEVICE)
Bilirubin Urine: NEGATIVE
Glucose, UA: NEGATIVE mg/dL
Ketones, ur: NEGATIVE mg/dL
Specific Gravity, Urine: 1.02 (ref 1.005–1.030)
Urobilinogen, UA: 0.2 mg/dL (ref 0.0–1.0)

## 2012-09-11 LAB — OB RESULTS CONSOLE GBS: GBS: POSITIVE

## 2012-09-11 NOTE — Patient Instructions (Signed)
Return to clinic for any obstetric concerns or go to [facility] for evaluation  

## 2012-09-11 NOTE — Progress Notes (Signed)
NST performed today was reviewed and was found to be reactive.  Continue recommended antenatal testing and prenatal care.  09/07/12 [redacted]w[redacted]d ultrasound showed EFW 2823 g (6-4)/67%, AFI 21 cm, cephalic, anterior placenta. Pelvic cultures done today. No complaints or concerns.  Fetal movement and labor precautions reviewed.

## 2012-09-11 NOTE — Progress Notes (Signed)
Pulse: 92

## 2012-09-11 NOTE — Progress Notes (Signed)
[place] for growth done 09/07/12.

## 2012-09-12 LAB — GC/CHLAMYDIA PROBE AMP: GC Probe RNA: NEGATIVE

## 2012-09-14 ENCOUNTER — Ambulatory Visit (INDEPENDENT_AMBULATORY_CARE_PROVIDER_SITE_OTHER): Payer: Medicaid Other | Admitting: *Deleted

## 2012-09-14 VITALS — BP 117/57

## 2012-09-14 DIAGNOSIS — O09299 Supervision of pregnancy with other poor reproductive or obstetric history, unspecified trimester: Secondary | ICD-10-CM

## 2012-09-14 NOTE — Progress Notes (Signed)
NST performed today was reviewed and was found to be reactive.  AFI normal at 14.9 cm. Continue recommended antenatal testing and prenatal care. 

## 2012-09-14 NOTE — Progress Notes (Signed)
P = 92 

## 2012-09-15 ENCOUNTER — Encounter: Payer: Self-pay | Admitting: Obstetrics & Gynecology

## 2012-09-15 DIAGNOSIS — O99820 Streptococcus B carrier state complicating pregnancy: Secondary | ICD-10-CM | POA: Insufficient documentation

## 2012-09-18 ENCOUNTER — Telehealth: Payer: Self-pay | Admitting: Obstetrics & Gynecology

## 2012-09-18 ENCOUNTER — Other Ambulatory Visit: Payer: Medicaid Other

## 2012-09-18 NOTE — Telephone Encounter (Signed)
Called patient about missed appointment. Wasn't able to leave a message.

## 2012-09-21 ENCOUNTER — Ambulatory Visit (INDEPENDENT_AMBULATORY_CARE_PROVIDER_SITE_OTHER): Payer: Medicaid Other | Admitting: *Deleted

## 2012-09-21 VITALS — BP 118/61

## 2012-09-21 DIAGNOSIS — O09299 Supervision of pregnancy with other poor reproductive or obstetric history, unspecified trimester: Secondary | ICD-10-CM

## 2012-09-21 NOTE — Progress Notes (Addendum)
P = 92   IOL scheduled per protocol on 7/25 @ 0730

## 2012-09-24 ENCOUNTER — Inpatient Hospital Stay (HOSPITAL_COMMUNITY)
Admission: AD | Admit: 2012-09-24 | Discharge: 2012-09-24 | Disposition: A | Discharge status: Home / Self Care | Payer: Medicaid Other | Source: Ambulatory Visit | Attending: Obstetrics & Gynecology | Admitting: Obstetrics & Gynecology

## 2012-09-24 ENCOUNTER — Encounter (HOSPITAL_COMMUNITY): Payer: Self-pay | Admitting: *Deleted

## 2012-09-24 DIAGNOSIS — O479 False labor, unspecified: Secondary | ICD-10-CM | POA: Insufficient documentation

## 2012-09-24 DIAGNOSIS — O99891 Other specified diseases and conditions complicating pregnancy: Secondary | ICD-10-CM | POA: Insufficient documentation

## 2012-09-24 LAB — AMNISURE RUPTURE OF MEMBRANE (ROM) NOT AT ARMC: Amnisure ROM: NEGATIVE

## 2012-09-24 NOTE — MAU Note (Signed)
Pt presents with complaints of rupture of membranes at 8 this morning and states the fluid was clear. Pt says she is having some contractions that are 15 mins apart.

## 2012-09-25 ENCOUNTER — Encounter: Payer: Self-pay | Admitting: Obstetrics and Gynecology

## 2012-09-25 ENCOUNTER — Ambulatory Visit (INDEPENDENT_AMBULATORY_CARE_PROVIDER_SITE_OTHER): Payer: Medicaid Other | Admitting: Obstetrics and Gynecology

## 2012-09-25 VITALS — BP 112/72 | Temp 97.3°F | Wt 165.5 lb

## 2012-09-25 DIAGNOSIS — O0930 Supervision of pregnancy with insufficient antenatal care, unspecified trimester: Secondary | ICD-10-CM

## 2012-09-25 DIAGNOSIS — O0933 Supervision of pregnancy with insufficient antenatal care, third trimester: Secondary | ICD-10-CM

## 2012-09-25 DIAGNOSIS — O0993 Supervision of high risk pregnancy, unspecified, third trimester: Secondary | ICD-10-CM

## 2012-09-25 DIAGNOSIS — O99820 Streptococcus B carrier state complicating pregnancy: Secondary | ICD-10-CM

## 2012-09-25 DIAGNOSIS — O09299 Supervision of pregnancy with other poor reproductive or obstetric history, unspecified trimester: Secondary | ICD-10-CM

## 2012-09-25 DIAGNOSIS — Z22330 Carrier of Group B streptococcus: Secondary | ICD-10-CM

## 2012-09-25 DIAGNOSIS — O09293 Supervision of pregnancy with other poor reproductive or obstetric history, third trimester: Secondary | ICD-10-CM

## 2012-09-25 DIAGNOSIS — D573 Sickle-cell trait: Secondary | ICD-10-CM

## 2012-09-25 DIAGNOSIS — O09899 Supervision of other high risk pregnancies, unspecified trimester: Secondary | ICD-10-CM

## 2012-09-25 LAB — POCT URINALYSIS DIP (DEVICE)
Bilirubin Urine: NEGATIVE
Glucose, UA: NEGATIVE mg/dL
Nitrite: NEGATIVE
Urobilinogen, UA: 0.2 mg/dL (ref 0.0–1.0)

## 2012-09-25 NOTE — Progress Notes (Signed)
Pulse- 96 Patient reports lower abdominal pain & pressure and contractions; reports difficulty laying on side due to hips feeling "stuck"

## 2012-09-25 NOTE — Progress Notes (Signed)
Patient doing well without complaints. Went to MAU yesterday to rule out rupture- NST reactive then and patient was ruled out for rupture. Patient unable to return this week for another NST prior to firday. FM/labor precautions reviewed. Scheduled for IOL on 7/25 at 7:30 am

## 2012-09-26 ENCOUNTER — Telehealth (HOSPITAL_COMMUNITY): Payer: Self-pay | Admitting: *Deleted

## 2012-09-26 ENCOUNTER — Encounter (HOSPITAL_COMMUNITY): Payer: Self-pay | Admitting: *Deleted

## 2012-09-26 NOTE — Telephone Encounter (Signed)
Preadmission screen  

## 2012-09-26 NOTE — Progress Notes (Signed)
Reactive NST on 09/21/12 

## 2012-09-28 ENCOUNTER — Encounter: Payer: Self-pay | Admitting: Advanced Practice Midwife

## 2012-09-28 NOTE — H&P (Signed)
  Sereen Schaff is a 22 y.o. female G3P2001 with IUP at [redacted]w[redacted]d presenting for HX IUFD @ 38 weeks [facility] at [facility] since 22 wks  Prenatal History/Complications: Hx IUFD @ 38 weeks, unexplained Past Medical History: Past Medical History  Diagnosis Date  . Eczema   . Anemia   . Sickle cell trait     Past Surgical History: Past Surgical History  Procedure Laterality Date  . No past surgeries      Obstetrical History: OB History   Grav Para Term Preterm Abortions TAB SAB Ect Mult Living   3 2 2       1       Gynecological History: OB History   Grav Para Term Preterm Abortions TAB SAB Ect Mult Living   3 2 2       1       Social History: History   Social History  . Marital Status: Single    Spouse Name: N/A    Number of Children: N/A  . Years of Education: N/A   Social History Main Topics  . Smoking status: Former Smoker -- 1.00 packs/day    Quit date: 05/22/2012  . Smokeless tobacco: Never Used  . Alcohol Use: No  . Drug Use: No  . Sexually Active: Yes    Birth Control/ Protection: None   Other Topics Concern  . Not on file   Social History Narrative  . No narrative on file    Family History: Family History  Problem Relation Age of Onset  . Hypertension Father     Allergies: No Known Allergies   (Not in a hospital admission)   Review of Systems   Constitutional: Negative for fever and chills Eyes: Negative for visual disturbances Respiratory: Negative for shortness of breath, dyspnea Cardiovascular: Negative for chest pain or palpitations  Gastrointestinal: Negative for vomiting, diarrhea and constipation Genitourinary: Negative for dysuria and urgency Musculoskeletal: Negative for back pain, joint pain, myalgias  Neurological: Negative for dizziness and headaches  Last menstrual period 12/31/2011. General appearance: alert, cooperative and no distress Lungs: clear to auscultation bilaterally Heart: regular rate and rhythm Abdomen: soft,  non-tender; bowel sounds normal Pelvic: 3-4/50/-2 Extremities: Homans sign is negative, no sign of DVT DTR's 2+ Presentation: cephalic Fetal monitoringBaseline: 130 bpm, Variability: Good {> 6 bpm), Accelerations: Reactive and Decelerations: Absent Uterine activity    Mild, ("tightening") q 5 minutes      Prenatal labs: ABO, Rh: B/POS/-- (04/14 1131) Antibody: NEG (04/14 1131) Rubella:  immune RPR: NON REAC (05/12 1131)  HBsAg: NEGATIVE (04/14 1131)  HIV: NON REACTIVE (04/14 1131)  GBS: Positive (07/07 0000)  1 hr Glucola 116 Genetic screening  Too late Anatomy US normal   Assessment: Yamilee Harmes is a 22 y.o. G3P2001 with an IUP at [redacted]w[redacted]d presenting for IOL for hx of IUFD  Plan: Pitocin GBS prophylaxis   CRESENZO-DISHMAN,Imanuel Pruiett 09/29/2012, 8:04 AM

## 2012-09-29 ENCOUNTER — Encounter (HOSPITAL_COMMUNITY): Payer: Self-pay | Admitting: Anesthesiology

## 2012-09-29 ENCOUNTER — Encounter (HOSPITAL_COMMUNITY): Payer: Self-pay

## 2012-09-29 ENCOUNTER — Inpatient Hospital Stay (HOSPITAL_COMMUNITY): Payer: Medicaid Other | Admitting: Anesthesiology

## 2012-09-29 ENCOUNTER — Inpatient Hospital Stay (HOSPITAL_COMMUNITY)
Admission: RE | Admit: 2012-09-29 | Discharge: 2012-10-02 | DRG: 775 | Disposition: A | Discharge status: Home / Self Care | Payer: Medicaid Other | Source: Ambulatory Visit | Attending: Obstetrics and Gynecology | Admitting: Obstetrics and Gynecology

## 2012-09-29 VITALS — BP 133/78 | HR 62 | Temp 97.4°F | Resp 18 | Ht 65.0 in | Wt 168.0 lb

## 2012-09-29 DIAGNOSIS — A0472 Enterocolitis due to Clostridium difficile, not specified as recurrent: Secondary | ICD-10-CM | POA: Diagnosis present

## 2012-09-29 DIAGNOSIS — O99892 Other specified diseases and conditions complicating childbirth: Principal | ICD-10-CM | POA: Diagnosis present

## 2012-09-29 DIAGNOSIS — O09299 Supervision of pregnancy with other poor reproductive or obstetric history, unspecified trimester: Secondary | ICD-10-CM

## 2012-09-29 DIAGNOSIS — O99820 Streptococcus B carrier state complicating pregnancy: Secondary | ICD-10-CM

## 2012-09-29 DIAGNOSIS — O0933 Supervision of pregnancy with insufficient antenatal care, third trimester: Secondary | ICD-10-CM

## 2012-09-29 DIAGNOSIS — O0993 Supervision of high risk pregnancy, unspecified, third trimester: Secondary | ICD-10-CM

## 2012-09-29 DIAGNOSIS — O09293 Supervision of pregnancy with other poor reproductive or obstetric history, third trimester: Secondary | ICD-10-CM

## 2012-09-29 DIAGNOSIS — Z22330 Carrier of Group B streptococcus: Secondary | ICD-10-CM

## 2012-09-29 LAB — CBC
HCT: 30.7 % — ABNORMAL LOW (ref 36.0–46.0)
Hemoglobin: 10.3 g/dL — ABNORMAL LOW (ref 12.0–15.0)
MCHC: 33.6 g/dL (ref 30.0–36.0)
MCV: 74.2 fL — ABNORMAL LOW (ref 78.0–100.0)
RDW: 13.1 % (ref 11.5–15.5)

## 2012-09-29 MED ORDER — EPHEDRINE 5 MG/ML INJ
10.0000 mg | INTRAVENOUS | Status: DC | PRN
  Filled 2012-09-29: qty 2

## 2012-09-29 MED ORDER — FENTANYL 2.5 MCG/ML BUPIVACAINE 1/10 % EPIDURAL INFUSION (WH - ANES)
INTRAMUSCULAR | Status: DC | PRN
  Administered 2012-09-29: 18:00:00
  Administered 2012-09-29: 14 mL/h via EPIDURAL

## 2012-09-29 MED ORDER — LACTATED RINGERS IV SOLN
500.0000 mL | Freq: Once | INTRAVENOUS | Status: AC
  Administered 2012-09-29: 500 mL via INTRAVENOUS

## 2012-09-29 MED ORDER — DIPHENHYDRAMINE HCL 50 MG/ML IJ SOLN
12.5000 mg | INTRAMUSCULAR | Status: AC | PRN
  Administered 2012-09-29 (×3): 12.5 mg via INTRAVENOUS
  Filled 2012-09-29: qty 1

## 2012-09-29 MED ORDER — TERBUTALINE SULFATE 1 MG/ML IJ SOLN: 0.2500 mg | Freq: Once | INTRAMUSCULAR | Status: AC | PRN

## 2012-09-29 MED ORDER — PHENYLEPHRINE 40 MCG/ML (10ML) SYRINGE FOR IV PUSH (FOR BLOOD PRESSURE SUPPORT)
80.0000 ug | PREFILLED_SYRINGE | INTRAVENOUS | Status: DC | PRN
  Filled 2012-09-29: qty 2
  Filled 2012-09-29: qty 5

## 2012-09-29 MED ORDER — PENICILLIN G POTASSIUM 5000000 UNITS IJ SOLR
2.5000 10*6.[IU] | INTRAVENOUS | Status: DC
  Administered 2012-09-29 – 2012-09-30 (×4): 2.5 10*6.[IU] via INTRAVENOUS
  Filled 2012-09-29 (×8): qty 2.5

## 2012-09-29 MED ORDER — LACTATED RINGERS IV SOLN
INTRAVENOUS | Status: DC
  Administered 2012-09-29 (×3): via INTRAVENOUS

## 2012-09-29 MED ORDER — LIDOCAINE HCL (PF) 1 % IJ SOLN
INTRAMUSCULAR | Status: DC | PRN
  Administered 2012-09-29 (×2): 4 mL

## 2012-09-29 MED ORDER — OXYTOCIN 40 UNITS IN LACTATED RINGERS INFUSION - SIMPLE MED: 62.5000 mL/h | INTRAVENOUS | Status: DC

## 2012-09-29 MED ORDER — OXYTOCIN 40 UNITS IN LACTATED RINGERS INFUSION - SIMPLE MED
1.0000 m[IU]/min | INTRAVENOUS | Status: DC
  Administered 2012-09-29: 2 m[IU]/min via INTRAVENOUS
  Administered 2012-09-29: 8 m[IU]/min via INTRAVENOUS
  Filled 2012-09-29: qty 1000

## 2012-09-29 MED ORDER — LIDOCAINE HCL (PF) 1 % IJ SOLN
30.0000 mL | INTRAMUSCULAR | Status: DC | PRN
  Filled 2012-09-29: qty 30

## 2012-09-29 MED ORDER — IBUPROFEN 600 MG PO TABS
600.0000 mg | ORAL_TABLET | Freq: Four times a day (QID) | ORAL | Status: DC | PRN
  Administered 2012-09-30: 600 mg via ORAL
  Filled 2012-09-29: qty 1

## 2012-09-29 MED ORDER — CITRIC ACID-SODIUM CITRATE 334-500 MG/5ML PO SOLN: 30.0000 mL | ORAL | Status: DC | PRN

## 2012-09-29 MED ORDER — PENICILLIN G POTASSIUM 5000000 UNITS IJ SOLR
5.0000 10*6.[IU] | Freq: Once | INTRAVENOUS | Status: AC
  Administered 2012-09-29: 5 10*6.[IU] via INTRAVENOUS
  Filled 2012-09-29: qty 5

## 2012-09-29 MED ORDER — EPHEDRINE 5 MG/ML INJ
10.0000 mg | INTRAVENOUS | Status: DC | PRN
  Filled 2012-09-29: qty 2
  Filled 2012-09-29: qty 4

## 2012-09-29 MED ORDER — ACETAMINOPHEN 325 MG PO TABS: 650.0000 mg | ORAL_TABLET | ORAL | Status: DC | PRN

## 2012-09-29 MED ORDER — PHENYLEPHRINE 40 MCG/ML (10ML) SYRINGE FOR IV PUSH (FOR BLOOD PRESSURE SUPPORT)
80.0000 ug | PREFILLED_SYRINGE | INTRAVENOUS | Status: DC | PRN
  Filled 2012-09-29: qty 2

## 2012-09-29 MED ORDER — OXYTOCIN BOLUS FROM INFUSION: 500.0000 mL | INTRAVENOUS | Status: DC

## 2012-09-29 MED ORDER — LACTATED RINGERS IV SOLN
500.0000 mL | INTRAVENOUS | Status: DC | PRN
  Administered 2012-09-29: 500 mL via INTRAVENOUS

## 2012-09-29 MED ORDER — ONDANSETRON HCL 4 MG/2ML IJ SOLN
4.0000 mg | Freq: Four times a day (QID) | INTRAMUSCULAR | Status: DC | PRN
  Administered 2012-09-30: 4 mg via INTRAVENOUS
  Filled 2012-09-29: qty 2

## 2012-09-29 MED ORDER — OXYCODONE-ACETAMINOPHEN 5-325 MG PO TABS: 1.0000 | ORAL_TABLET | ORAL | Status: DC | PRN

## 2012-09-29 MED ORDER — FENTANYL 2.5 MCG/ML BUPIVACAINE 1/10 % EPIDURAL INFUSION (WH - ANES)
14.0000 mL/h | INTRAMUSCULAR | Status: DC | PRN
  Administered 2012-09-30: 14 mL/h via EPIDURAL
  Filled 2012-09-29 (×3): qty 125

## 2012-09-29 MED ORDER — LACTATED RINGERS IV SOLN
INTRAVENOUS | Status: DC
  Administered 2012-09-29: 23:00:00 via INTRAUTERINE

## 2012-09-29 NOTE — Progress Notes (Addendum)
Evelyn [name] is a 22 y.o. G3P2001 at [redacted]w[redacted]d admitted for induction of labor due to history of IUFD at 93wks.  Subjective: Patient comfortable with epidural.   Objective: BP 110/96  Pulse 184  Temp(Src) 98.3 F (36.8 C) (Axillary)  Resp 18  Ht 5\' 5"  (1.651 m)  Wt 76.204 kg (168 lb)  BMI 27.96 kg/m2  SpO2 100%  LMP 12/31/2011 I/O last 3 completed shifts: In: -  Out: 1000 [Urine:1000]    FHT:  FHR: 130 bpm, variability: moderate,  accelerations:  Present,  decelerations:  Present variables UC:   irregular SVE:   Dilation: 7 Effacement (%): 80 Station: -1 Exam by:: AL rinehart RN  Labs: Lab Results  Component Value Date   WBC 6.1 09/29/2012   HGB 10.3* 09/29/2012   HCT 30.7* 09/29/2012   MCV 74.2* 09/29/2012   PLT 264 09/29/2012    Assessment / Plan: Induction of labor due to history of IUFD AROM IUPC placed now, amnio infusion Pitocin stopped for variables, will restart after infusion started  Labor: active phase Fetal Wellbeing:  Category II Pain Control:  Epidural I/D:  penicillin Anticipated MOD:  NSVD  Tawni Carnes 09/29/2012, 11:05 PM  Seen also by me MVUs only 45/10 min Will try to restart Pitocin with amnioinfusion  Wynelle Bourgeois CNM

## 2012-09-29 NOTE — Progress Notes (Signed)
Patient ID: Evelyn [name], female   DOB: [birth year], 21 y.o.   MRN: [id number]   S:  Pt comfortable with epidural  O:   Filed Vitals:   09/29/12 2031 09/29/12 2100 09/29/12 2130 09/29/12 2200  BP: 108/43 116/57 101/55 95/42  Pulse: 66 67 68 73  Temp:  98.3 F (36.8 C)    TempSrc:  Axillary    Resp: 18 18 18 18   Height:      Weight:      SpO2:        Dilation: 6 Effacement (%): 80 Cervical Position: Anterior Station: -1 Presentation: Vertex Exam by: Vassie Loll  AROM, clear  FHTs:  130, moderate variability, accels present, no decels TOCO:  q 2-4 minutes  A/P 21 y.o. G3P2001 at [redacted]w[redacted]d here for IOL for history of 38 wk IUFD.   Progressing well on pitocin AROM, clear FHTs reactive Anticipate SVD  Napoleon Form, MD

## 2012-09-29 NOTE — Anesthesia Preprocedure Evaluation (Signed)
Anesthesia Evaluation  Patient identified by MRN, date of birth, ID band Patient awake    Reviewed: Allergy & Precautions, H&P , NPO status , Patient's Chart, lab work & pertinent test results  Airway Mallampati: III TM Distance: >3 FB Neck ROM: Full    Dental no notable dental hx. (+) Teeth Intact   Pulmonary neg pulmonary ROS, former smoker,  breath sounds clear to auscultation  Pulmonary exam normal       Cardiovascular negative cardio ROS  Rhythm:Regular Rate:Normal     Neuro/Psych negative neurological ROS  negative psych ROS   GI/Hepatic negative GI ROS, Neg liver ROS,   Endo/Other  negative endocrine ROS  Renal/GU negative Renal ROS  negative genitourinary   Musculoskeletal   Abdominal   Peds  Hematology  (+) Blood dyscrasia, Sickle cell trait and anemia ,   Anesthesia Other Findings   Reproductive/Obstetrics (+) Pregnancy                           Anesthesia Physical Anesthesia Plan  ASA: II  Anesthesia Plan: Epidural   Post-op Pain Management:    Induction:   Airway Management Planned: Natural Airway  Additional Equipment:   Intra-op Plan:   Post-operative Plan:   Informed Consent: I have reviewed the patients History and Physical, chart, labs and discussed the procedure including the risks, benefits and alternatives for the proposed anesthesia with the patient or authorized representative who has indicated his/her understanding and acceptance.   Dental advisory given  Plan Discussed with: Anesthesiologist and CRNA  Anesthesia Plan Comments:         Anesthesia Quick Evaluation

## 2012-09-29 NOTE — Anesthesia Procedure Notes (Signed)
Epidural Patient location during procedure: OB Start time: 09/29/2012 10:19 AM  Staffing Anesthesiologist: Matisse Roskelley A. Performed by: anesthesiologist   Preanesthetic Checklist Completed: patient identified, site marked, surgical consent, pre-op evaluation, timeout performed, IV checked, risks and benefits discussed and monitors and equipment checked  Epidural Patient position: sitting Prep: site prepped and draped and DuraPrep Patient monitoring: continuous pulse ox and blood pressure Approach: midline Injection technique: LOR air  Needle:  Needle type: Tuohy  Needle gauge: 17 G Needle length: 9 cm and 9 Needle insertion depth: 5 cm cm Catheter type: closed end flexible Catheter size: 19 Gauge Catheter at skin depth: 10 cm Test dose: negative and Other  Assessment Events: blood not aspirated, injection not painful, no injection resistance, negative IV test and no paresthesia  Additional Notes Patient identified. Risks and benefits discussed including failed block, incomplete  Pain control, post dural puncture headache, nerve damage, paralysis, blood pressure Changes, nausea, vomiting, reactions to medications-both toxic and allergic and post Partum back pain. All questions were answered. Patient expressed understanding and wished to proceed. Sterile technique was used throughout procedure. Epidural site was Dressed with sterile barrier dressing. No paresthesias, signs of intravascular injection Or signs of intrathecal spread were encountered.  Patient was more comfortable after the epidural was dosed. Please see RN's note for documentation of vital signs and FHR which are stable.

## 2012-09-29 NOTE — Progress Notes (Addendum)
Evelyn [name] is a 22 y.o. G3P2001 at [redacted]w[redacted]d admitted for induction of labor due to prior IUFD.  Subjective: Doing well with no complaints  Objective: BP 119/74  Pulse 67  Temp(Src) 98.1 F (36.7 C) (Oral)  Resp 20  Ht 5\' 5"  (1.651 m)  Wt 168 lb (76.204 kg)  BMI 27.96 kg/m2  SpO2 100%  LMP 12/31/2011      FHT:  FHR: 140s bpm, variability: moderate,  accelerations:  Present,  decelerations:  Absent UC:   regular, every 3  minutes SVE:   Dilation: 5 Effacement (%): 80 Station: -2 Exam by:: Erline Hau RNC 1500 Labs: Lab Results  Component Value Date   WBC 6.1 09/29/2012   HGB 10.3* 09/29/2012   HCT 30.7* 09/29/2012   MCV 74.2* 09/29/2012   PLT 264 09/29/2012    Assessment / Plan: Induction of labor due to prior IUFD,  progressing well on pitocin  Labor: Progressing on Pitocin, will continue to increase then AROM Preeclampsia:  no signs or symptoms of toxicity Fetal Wellbeing:  Category I Pain Control:  Epidural when pt requests. Control with fent PRN I/D:  GBS +, on PCN Anticipated MOD:  NSVD  Loza Prell, RYAN 09/29/2012, 1:11 PM

## 2012-09-30 ENCOUNTER — Encounter (HOSPITAL_COMMUNITY): Payer: Self-pay

## 2012-09-30 DIAGNOSIS — O09299 Supervision of pregnancy with other poor reproductive or obstetric history, unspecified trimester: Secondary | ICD-10-CM

## 2012-09-30 DIAGNOSIS — O9989 Other specified diseases and conditions complicating pregnancy, childbirth and the puerperium: Secondary | ICD-10-CM

## 2012-09-30 MED ORDER — OXYCODONE-ACETAMINOPHEN 5-325 MG PO TABS: 1.0000 | ORAL_TABLET | ORAL | Status: DC | PRN

## 2012-09-30 MED ORDER — DIBUCAINE 1 % RE OINT: 1.0000 "application " | TOPICAL_OINTMENT | RECTAL | Status: DC | PRN

## 2012-09-30 MED ORDER — PRENATAL MULTIVITAMIN CH
1.0000 | ORAL_TABLET | Freq: Every day | ORAL | Status: DC
  Administered 2012-09-30 – 2012-10-01 (×2): 1 via ORAL
  Filled 2012-09-30 (×2): qty 1

## 2012-09-30 MED ORDER — ONDANSETRON HCL 4 MG PO TABS: 4.0000 mg | ORAL_TABLET | ORAL | Status: DC | PRN

## 2012-09-30 MED ORDER — SENNOSIDES-DOCUSATE SODIUM 8.6-50 MG PO TABS: 2.0000 | ORAL_TABLET | Freq: Every day | ORAL | Status: DC

## 2012-09-30 MED ORDER — ONDANSETRON HCL 4 MG/2ML IJ SOLN: 4.0000 mg | INTRAMUSCULAR | Status: DC | PRN

## 2012-09-30 MED ORDER — ZOLPIDEM TARTRATE 5 MG PO TABS: 5.0000 mg | ORAL_TABLET | Freq: Every evening | ORAL | Status: DC | PRN

## 2012-09-30 MED ORDER — IBUPROFEN 600 MG PO TABS
600.0000 mg | ORAL_TABLET | Freq: Four times a day (QID) | ORAL | Status: DC
  Administered 2012-09-30 – 2012-10-02 (×8): 600 mg via ORAL
  Filled 2012-09-30 (×8): qty 1

## 2012-09-30 MED ORDER — SIMETHICONE 80 MG PO CHEW: 80.0000 mg | CHEWABLE_TABLET | ORAL | Status: DC | PRN

## 2012-09-30 MED ORDER — WITCH HAZEL-GLYCERIN EX PADS: 1.0000 "application " | MEDICATED_PAD | CUTANEOUS | Status: DC | PRN

## 2012-09-30 MED ORDER — BENZOCAINE-MENTHOL 20-0.5 % EX AERO
1.0000 "application " | INHALATION_SPRAY | CUTANEOUS | Status: DC | PRN
  Administered 2012-10-01: 1 via TOPICAL
  Filled 2012-09-30: qty 56

## 2012-09-30 MED ORDER — DIPHENHYDRAMINE HCL 25 MG PO CAPS: 25.0000 mg | ORAL_CAPSULE | Freq: Four times a day (QID) | ORAL | Status: DC | PRN

## 2012-09-30 MED ORDER — TETANUS-DIPHTH-ACELL PERTUSSIS 5-2.5-18.5 LF-MCG/0.5 IM SUSP
0.5000 mL | Freq: Once | INTRAMUSCULAR | Status: AC
  Administered 2012-10-01: 0.5 mL via INTRAMUSCULAR
  Filled 2012-09-30: qty 0.5

## 2012-09-30 MED ORDER — LANOLIN HYDROUS EX OINT: TOPICAL_OINTMENT | CUTANEOUS | Status: DC | PRN

## 2012-09-30 NOTE — Anesthesia Postprocedure Evaluation (Signed)
Anesthesia Post Note  Patient: Evelyn [name]  Procedure(s) Performed: * No procedures listed *  Anesthesia type: Epidural  Patient location: Mother/Baby  Post pain: Pain level controlled  Post assessment: Post-op Vital signs reviewed  Last Vitals:  Filed Vitals:   09/30/12 1045  BP: 110/67  Pulse: 79  Temp: 36.7 C  Resp: 18    Post vital signs: Reviewed  Level of consciousness:alert  Complications: No apparent anesthesia complications

## 2012-10-01 DIAGNOSIS — A0472 Enterocolitis due to Clostridium difficile, not specified as recurrent: Secondary | ICD-10-CM | POA: Diagnosis present

## 2012-10-01 MED ORDER — METRONIDAZOLE 500 MG PO TABS
500.0000 mg | ORAL_TABLET | Freq: Three times a day (TID) | ORAL | Status: DC
  Administered 2012-10-01 – 2012-10-02 (×3): 500 mg via ORAL
  Filled 2012-10-01 (×4): qty 1

## 2012-10-01 NOTE — Progress Notes (Signed)
Post Partum Day 1  Subjective: up ad lib, voiding, tolerating PO and + flatus States has not had a BM all day  Objective: Blood pressure 120/68, pulse 56, temperature 97.5 F (36.4 C), temperature source Oral, resp. rate 20, height 5\' 5"  (1.651 m), weight 76.204 kg (168 lb), last menstrual period 12/31/2011, SpO2 100.00%, unknown if currently breastfeeding.  Physical Exam:  General: alert, cooperative and no distress Lochia: appropriate Uterine Fundus: firm Incision: n/a DVT Evaluation: No evidence of DVT seen on physical exam.   Recent Labs  09/29/12 0730  HGB 10.3*  HCT 30.7*    Assessment/Plan: A:  Postpartum Day #1      C. Diff diarrhea  P:  Will start Flagyl 500mg  PO tid       Enteric precautions       Discussed with patient. Recommend 14 days of antibiotics.  Plan for discharge tomorrow   LOS: 2 days   [facility] 10/01/2012, 1:35 PM

## 2012-10-01 NOTE — Progress Notes (Signed)
Received a call from Lab with the + C-diff result.  Drema Dallas, CNM with results.  Will place patient on contact precautions and await further orders from MD. [name], Brantley Stage

## 2012-10-01 NOTE — Progress Notes (Signed)
Patient stated that she has been having diarrhea at least 10 times today.  Patient "feels fine" otherwise and temperature is WNL.  Called CNM on-call and received order for C-dff test sample to be taken for patient.   [name], Evelyn [name] M

## 2012-10-02 MED ORDER — IBUPROFEN 600 MG PO TABS: 600.0000 mg | ORAL_TABLET | Freq: Four times a day (QID) | ORAL | Status: DC

## 2012-10-02 NOTE — Progress Notes (Signed)
UR chart review completed.  

## 2012-10-02 NOTE — Discharge Summary (Signed)
Obstetric Discharge Summary Reason for Admission: induction of labor Prenatal Procedures: none Intrapartum Procedures: spontaneous vaginal delivery Postpartum Procedures: antibiotics Complications-Operative and Postpartum: none Hemoglobin  Date Value Range Status  09/29/2012 10.3* 12.0 - 15.0 g/dL Final     HCT  Date Value Range Status  09/29/2012 30.7* 36.0 - 46.0 % Final   Evelyn [name] is a 22 y.o. female G3P2001 with IUP at [redacted]w[redacted]d presenting for HX IUFD @ 38 weeks [facility] at [facility] since 22 wks. She presented for IOL at 39 weeks given her history. She progressed to deliver a liveborn female. Pt was GBS pos but with adequate prophylaxis.  She is bottle feeding and desires depo for contraception. Will send down to clinic for depo after discharge.   Physical Exam:  General: alert, cooperative and appears older than stated age Lochia: appropriate Uterine Fundus: firm Incision: na DVT Evaluation: No evidence of DVT seen on physical exam.  Discharge Diagnoses: Term Pregnancy-delivered  Discharge Information: Date: 10/02/2012 Activity: pelvic rest Diet: routine Medications: PNV, Ibuprofen and Colace Condition: stable Instructions: refer to practice specific booklet Discharge to: home Follow-up Information   Schedule an appointment as soon as possible for a visit with [facility].   Contact information:   [address] [place] [address] [phone]      Newborn Data: Live born female  Birth Weight: 7 lb 15 oz (3600 g) APGAR: 9, 9  Home with mother.  Lomax Poehler L 10/02/2012, 10:00 AM

## 2012-10-04 ENCOUNTER — Ambulatory Visit: Payer: Medicaid Other

## 2012-10-06 ENCOUNTER — Ambulatory Visit: Payer: Medicaid Other

## 2012-10-16 ENCOUNTER — Encounter: Payer: Self-pay | Admitting: *Deleted

## 2012-10-25 NOTE — H&P (Signed)
  Evelyn [name] is a 22 y.o. female G3P2001 with IUP at 102w6d presenting for HX IUFD @ 38 weeks [facility] at [facility] since 22 wks  Prenatal History/Complications: Hx IUFD @ 38 weeks, unexplained Past Medical History: Past Medical History  Diagnosis Date  . Eczema   . Anemia   . Sickle cell trait     Past Surgical History: Past Surgical History  Procedure Laterality Date  . No past surgeries      Obstetrical History: OB History as of 10/15/12   Grav Para Term Preterm Abortions TAB SAB Ect Mult Living   3 3 3       2       Gynecological History: OB History as of 10/15/12   Grav Para Term Preterm Abortions TAB SAB Ect Mult Living   3 3 3       2       Social History: History   Social History  . Marital Status: Single    Spouse Name: N/A    Number of Children: N/A  . Years of Education: N/A   Social History Main Topics  . Smoking status: Former Smoker -- 1.00 packs/day    Quit date: 05/22/2012  . Smokeless tobacco: Never Used  . Alcohol Use: No  . Drug Use: No  . Sexual Activity: Yes    Birth Control/ Protection: None   Other Topics Concern  . None   Social History Narrative  . None    Family History: Family History  Problem Relation Age of Onset  . Hypertension Father     Allergies: No Known Allergies  No prescriptions prior to admission     Review of Systems   Constitutional: Negative for fever and chills Eyes: Negative for visual disturbances Respiratory: Negative for shortness of breath, dyspnea Cardiovascular: Negative for chest pain or palpitations  Gastrointestinal: Negative for vomiting, diarrhea and constipation Genitourinary: Negative for dysuria and urgency Musculoskeletal: Negative for back pain, joint pain, myalgias  Neurological: Negative for dizziness and headaches  Blood pressure 133/78, pulse 62, temperature 97.4 F (36.3 C), temperature source Oral, resp. rate 18, height 5\' 5"  (1.651 m), weight 168 lb (76.204 kg), last menstrual  period 12/31/2011, SpO2 100.00%, unknown if currently breastfeeding. General appearance: alert, cooperative and no distress Lungs: clear to auscultation bilaterally Heart: regular rate and rhythm Abdomen: soft, non-tender; bowel sounds normal Pelvic: 3-4/50/-2 Extremities: Homans sign is negative, no sign of DVT DTR's 2+ Presentation: cephalic Fetal monitoringBaseline: 130 bpm, Variability: Good {> 6 bpm), Accelerations: Reactive and Decelerations: Absent Uterine activity    Mild, ("tightening") q 5 minutes  Dilation: 10 Effacement (%): 100 Station: 0 Exam by:: AL Rinehart RN   Prenatal labs: ABO, Rh: B/POS/-- (04/14 1131) Antibody: NEG (04/14 1131) Rubella:  immune RPR: NON REACTIVE (07/25 0730)  HBsAg: NEGATIVE (04/14 1131)  HIV: NON REACTIVE (04/14 1131)  GBS: Positive (07/07 0000)  1 hr Glucola 116 Genetic screening  Too late Anatomy US normal   Assessment: Evelyn [name] is a 22 y.o. G3P2001 with an IUP at [redacted]w[redacted]d presenting for IOL for hx of IUFD  Plan: Pitocin GBS prophylaxis   CRESENZO-DISHMAN,Verlia Kaney 10/25/2012, 12:32 PM

## 2012-10-27 NOTE — H&P (Signed)
Attestation of Attending Supervision of Advanced Practitioner (PA/CNM/NP): Evaluation and management procedures were performed by the Advanced Practitioner under my supervision and collaboration.  I have reviewed the Advanced Practitioner's note and chart, and I agree with the management and plan.  Esteen Delpriore, MD, FACOG Attending Obstetrician & Gynecologist Faculty Practice, [facility]  

## 2012-11-03 ENCOUNTER — Ambulatory Visit: Payer: Self-pay | Admitting: Obstetrics & Gynecology

## 2012-11-03 ENCOUNTER — Ambulatory Visit: Payer: Medicaid Other | Admitting: Obstetrics & Gynecology

## 2013-04-02 IMAGING — US US FETAL BPP W/O NONSTRESS
1 series · 12 of 28 positions shown · non-contrast
Comparison: none

[Series 1: us ob follow up · 49 acquisitions, 12 frames shown]
[im 2/49]
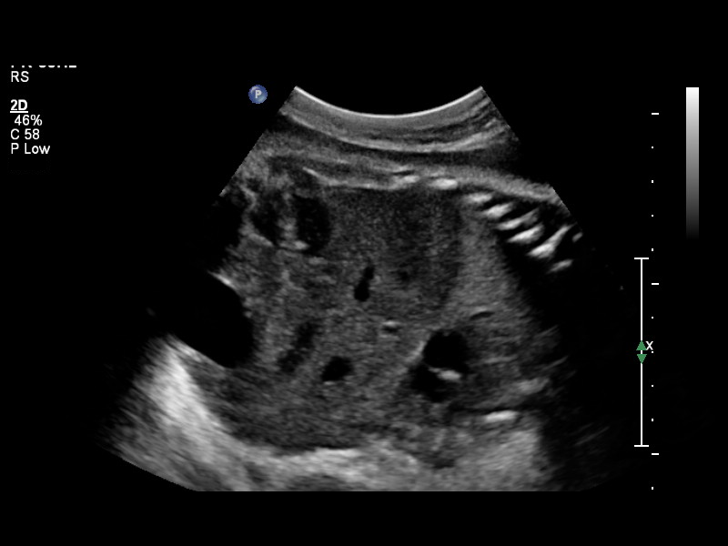
[im 6/49]
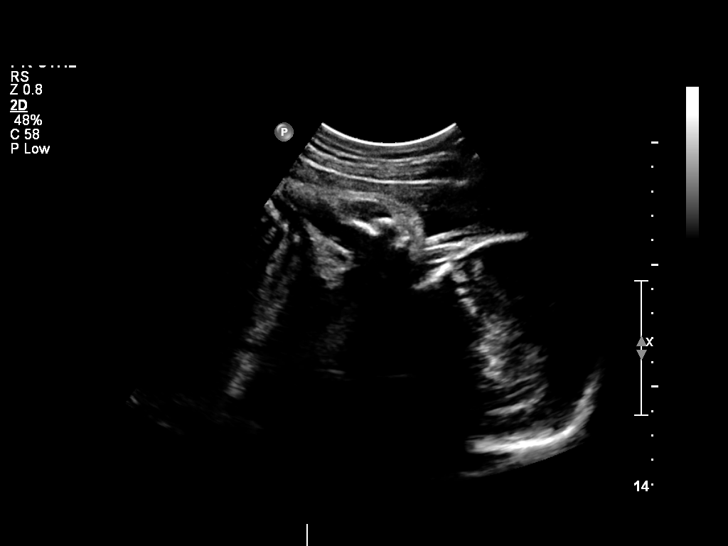
[im 9/49]
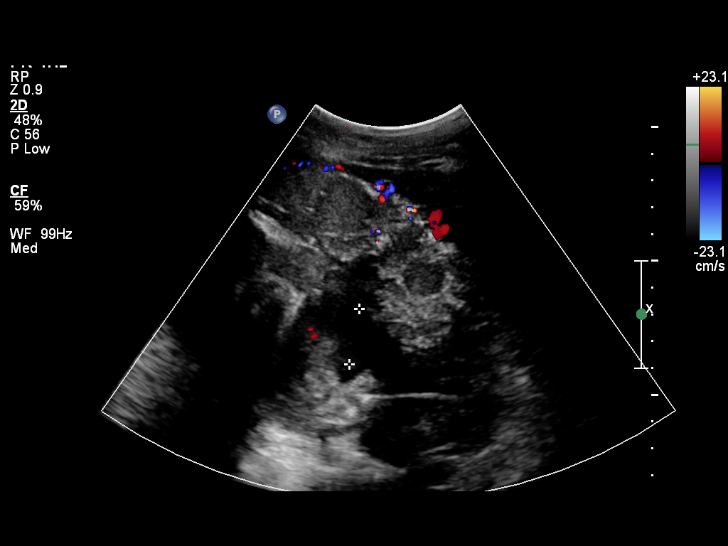
[im 15/49]
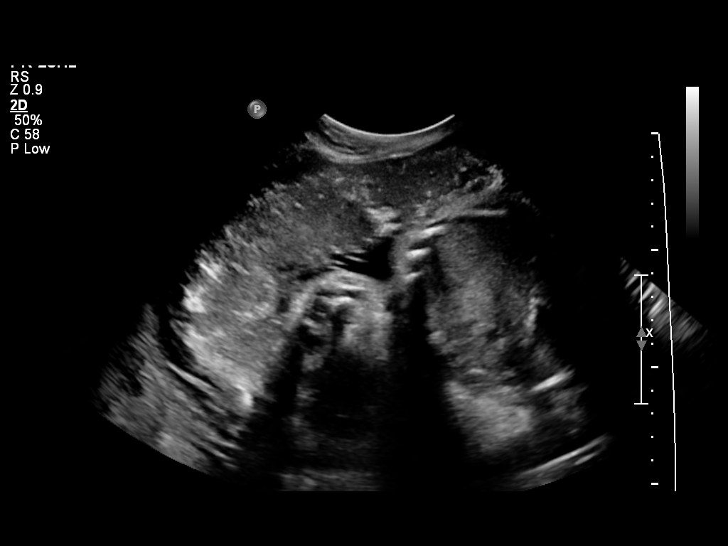
[im 18/49]
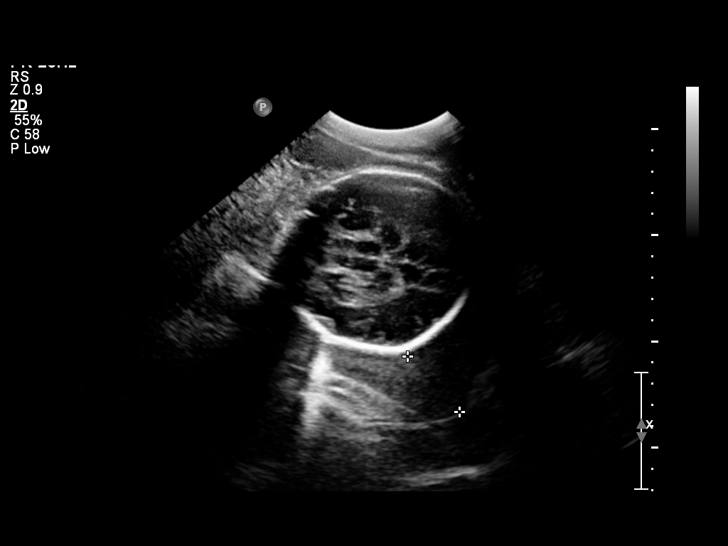
[im 22/49]
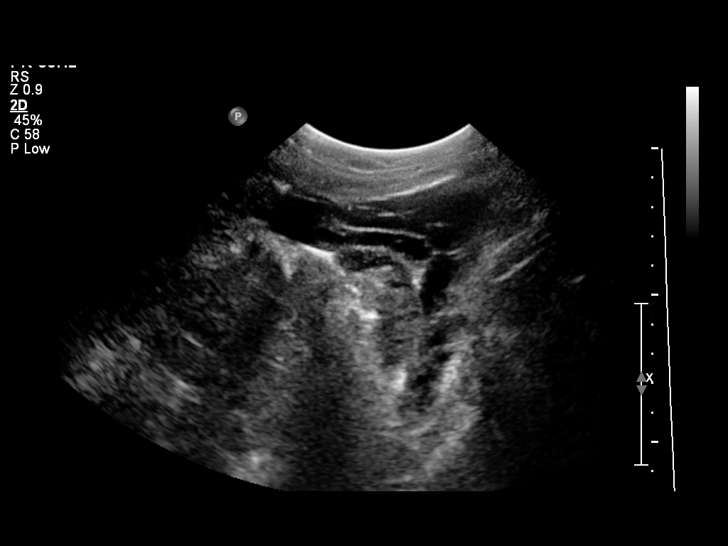
[im 27/49]
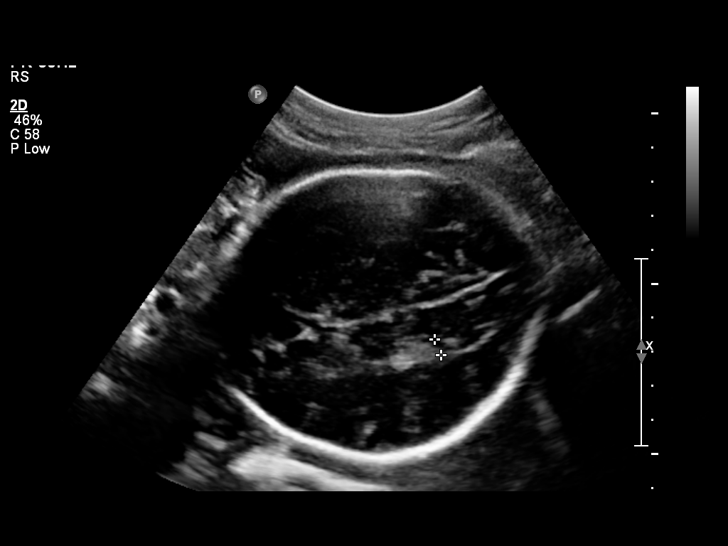
[im 31/49]
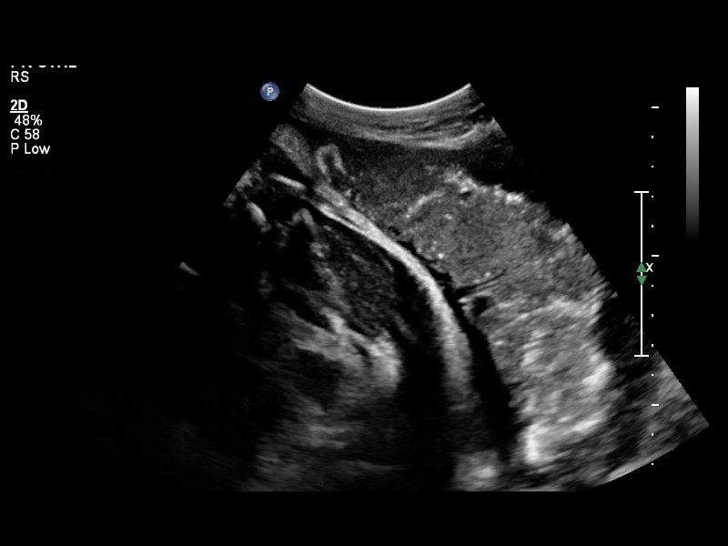
[im 34/49]
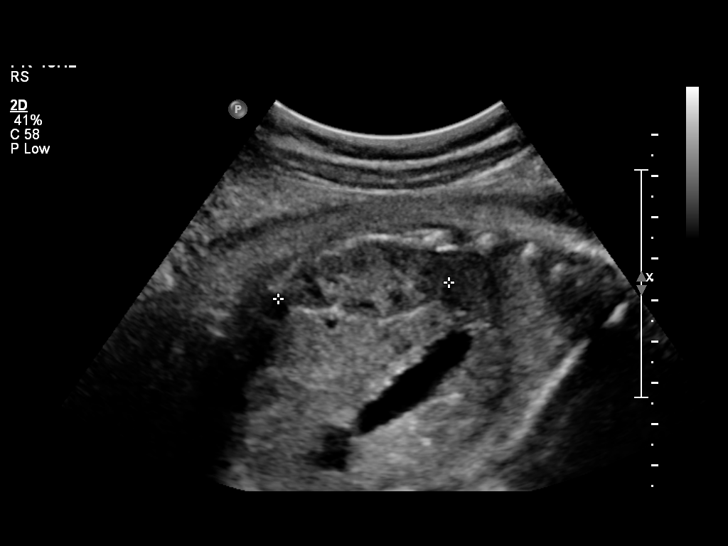
[im 40/49]
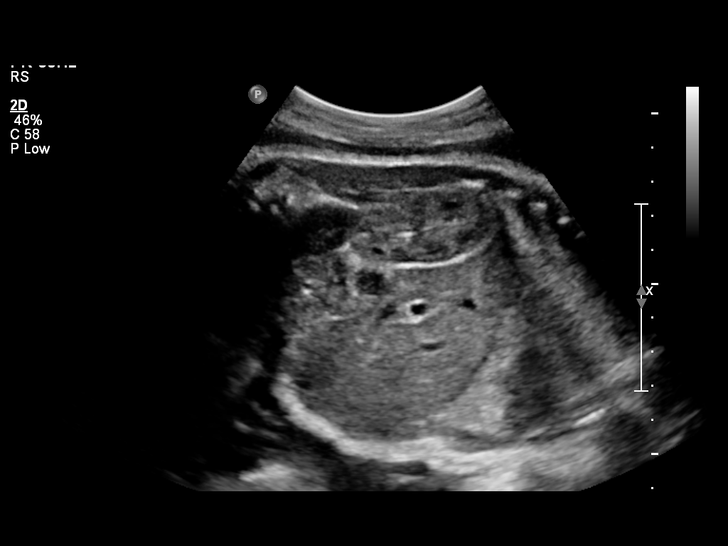
[im 43/49]
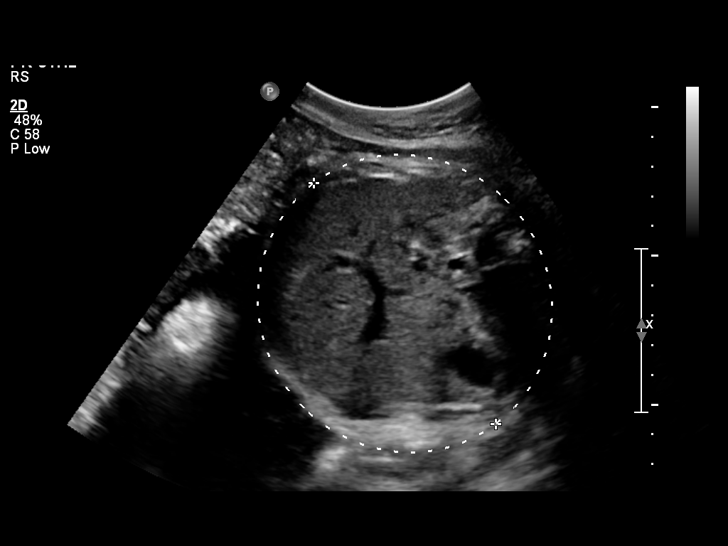
[im 47/49]
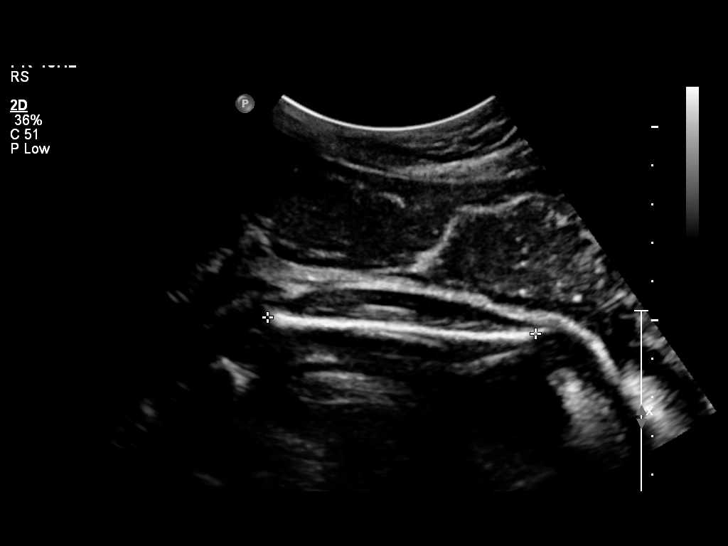

[12 of 28 positions shown; findings below may reference images not displayed]

OBSTETRICS REPORT
                      (Signed Final 02/25/2011 [DATE])

 Order#:         [id number]
                 93_O
Procedures

 US OB FOLLOW UP                                       76816.1
Indications

 Assess fetal well being
 Poor obstetric history: Previous IUFD (stillbirth)
 Assess Fetal Growth / Estimated Fetal Weight
Fetal Evaluation

 Fetal Heart Rate:  158                         bpm
 Cardiac Activity:  Observed
 Presentation:      Cephalic
 Placenta:          Anterior, above cervical os
 P. Cord            Previously Visualized
 Insertion:

 Amniotic Fluid
 AFI FV:      Subjectively within normal limits
 AFI Sum:     10.76   cm      25   %Tile     Larg Pckt:   3.42   cm
 RUQ:   3.42   cm    RLQ:    3.1    cm    LUQ:   2.14    cm   LLQ:    2.1    cm
Biophysical Evaluation

 Amniotic F.V:   Within normal limits       F. Tone:        Observed
 F. Movement:    Observed                   Score:          [DATE]
 F. Breathing:   Observed
Biometry

 BPD:     83.7  mm    G. Age:   33w 5d                CI:        76.67   70 - 86
                                                      FL/HC:      22.0   19.4 -

 HC:     302.8  mm    G. Age:   33w 5d       10  %    HC/AC:      0.98   0.96 -

 AC:     310.3  mm    G. Age:   35w 0d       79  %    FL/BPD:     79.7   71 - 87
 FL:      66.7  mm    G. Age:   34w 2d       49  %    FL/AC:      21.5   20 - 24

 Est. FW:    1031  gm      5 lb 6 oz     70  %
Gestational Age

 LMP:           34w 0d       Date:   07/02/10                 EDD:   04/08/11
 U/S Today:     34w 1d                                        EDD:   04/07/11
 Best:          34w 0d    Det. By:   LMP  (07/02/10)          EDD:   04/08/11
Anatomy

 Cranium:           Appears normal      Aortic Arch:       Previously seen
 Fetal Cavum:       Appears normal      Ductal Arch:       Previously seen
 Ventricles:        Appears normal      Diaphragm:         Appears normal
 Choroid Plexus:    Previously seen     Stomach:           Appears normal
 Cerebellum:        Previously seen     Abdomen:           Appears normal
 Posterior Fossa:   Previously seen     Abdominal Wall:    Previously seen
 Nuchal Fold:       Previously seen     Cord Vessels:      Previously seen
 Face:              Previously seen     Kidneys:           Appear normal
 Heart:             Previously seen     Bladder:           Appears normal
 RVOT:              Previously seen     Spine:             Previously seen
 LVOT:              Previously seen     Limbs:             Previously seen

 Other:     Heels and 5th digit previously seen.b Technically difficult
            due to advanced GA and fetal position.
Cervix Uterus Adnexa

 Cervical Length:   3.56      cm

 Cervix:       Normal appearance by transabdominal scan.

 Left Ovary:   Within normal limits.
 Right Ovary:  Within normal limits.
 Adnexa:     No abnormality visualized.
Impression

   Single living intrauterine gestation in cephalic presentation
 with concordant gestational age and fetal indices.  The EFW
 today is at the 70th percentile.

  [DATE] BPP.

 questions or concerns.

## 2013-04-20 IMAGING — US US OB FOLLOW-UP
1 series · 13 of 23 positions shown · non-contrast
Comparison: none

[Series 1: us ob follow up · 13 of 23 slices shown]
[im 1/23]
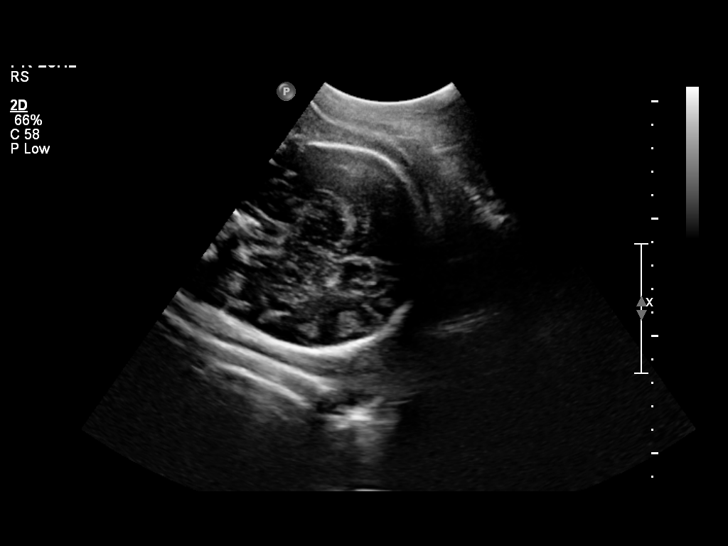
[im 3/23]
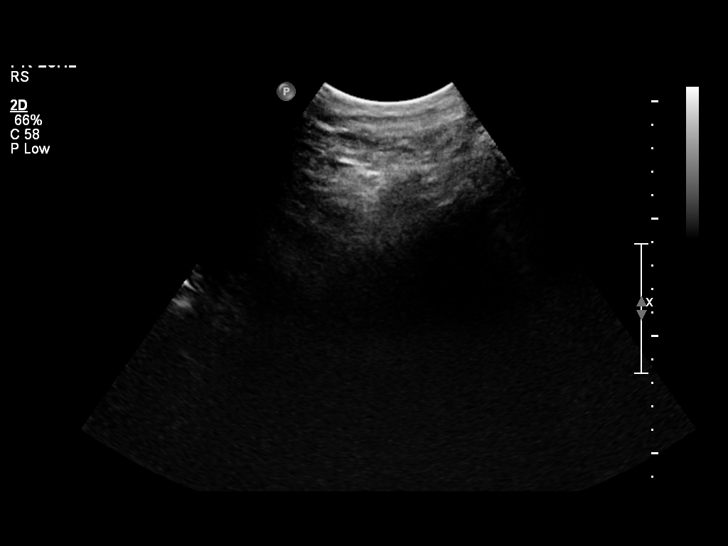
[im 5/23]
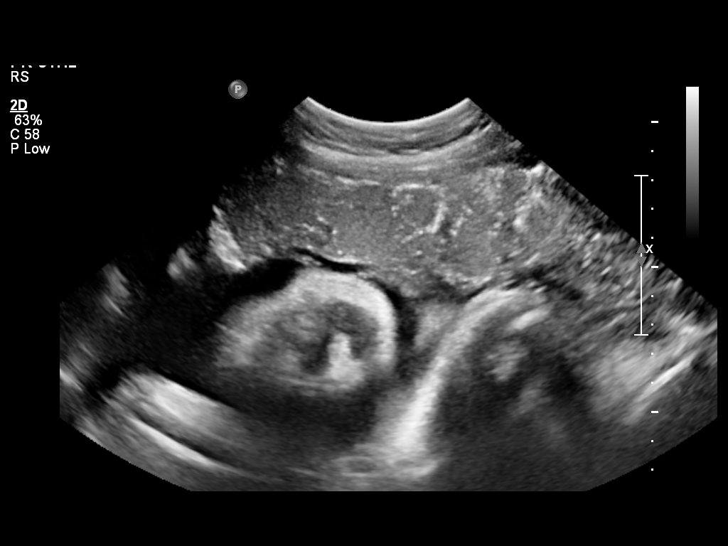
[im 7/23]
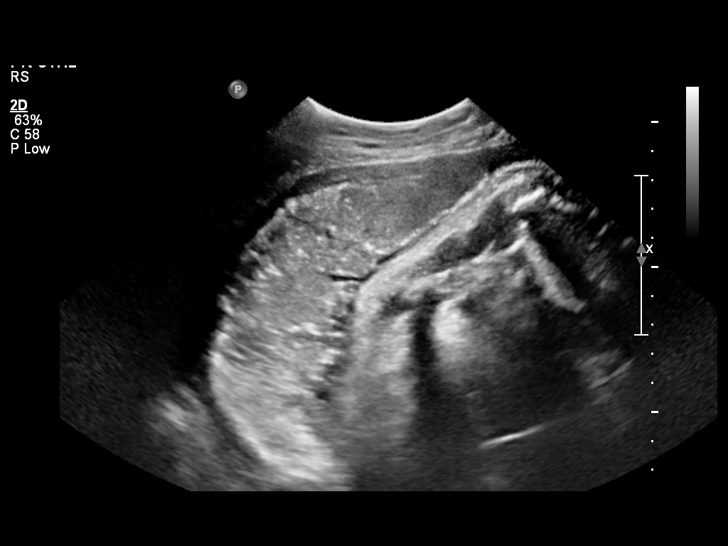
[im 8/23]
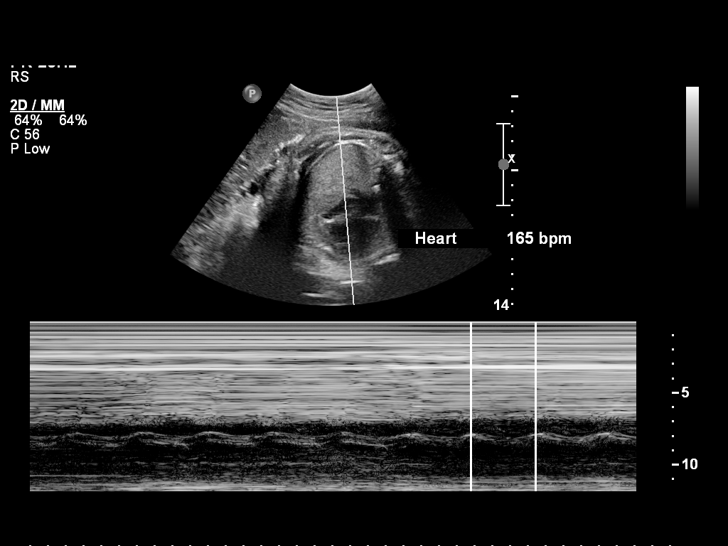
[im 10/23]
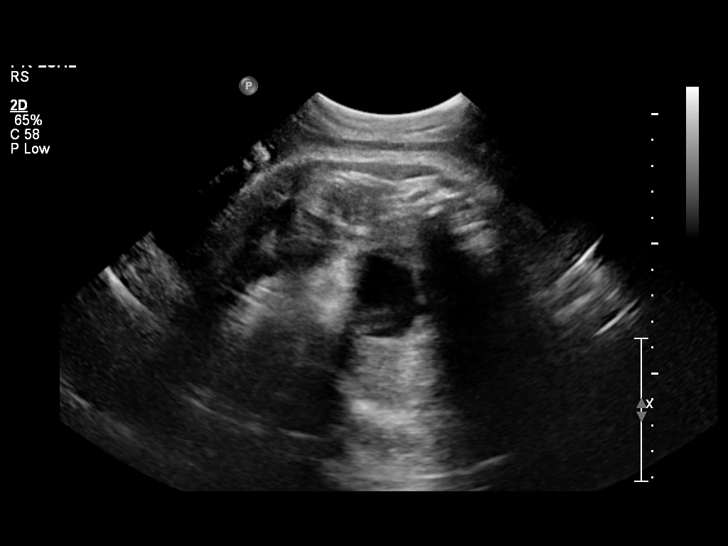
[im 12/23]
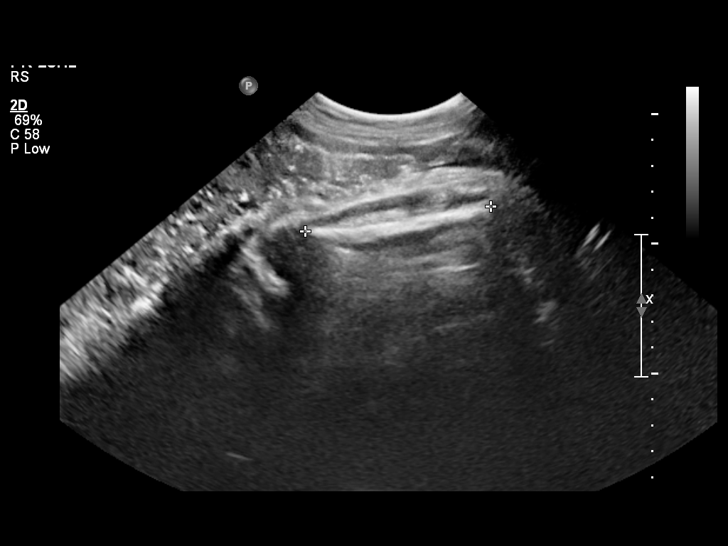
[im 14/23]
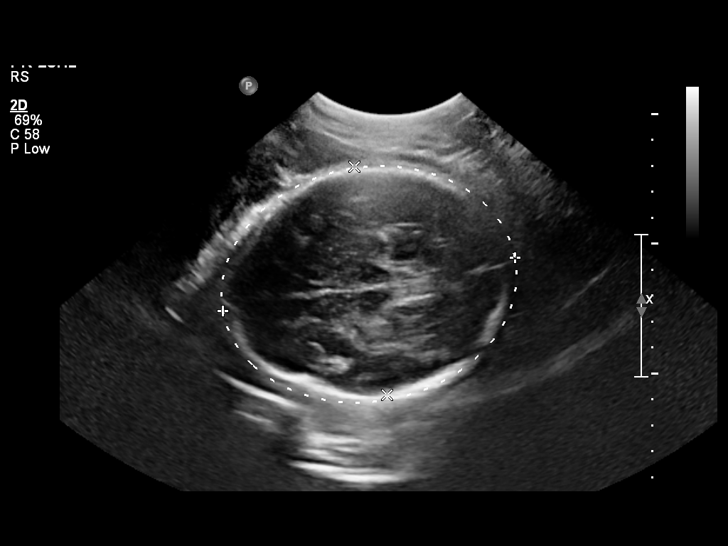
[im 16/23]
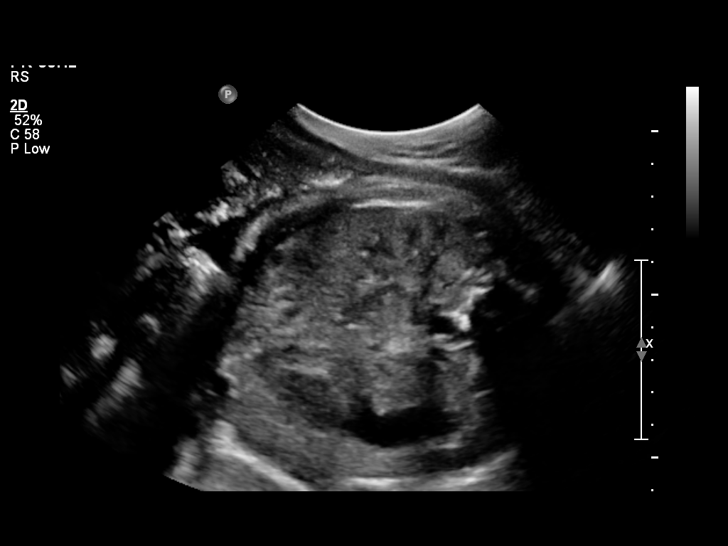
[im 17/23]
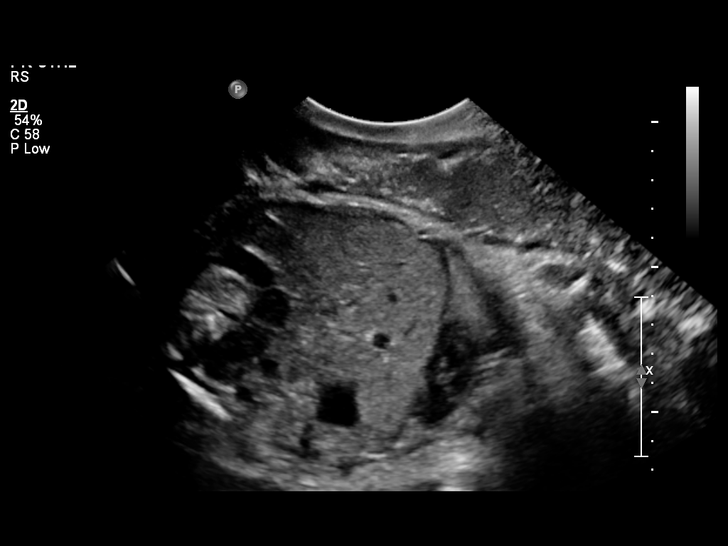
[im 19/23]
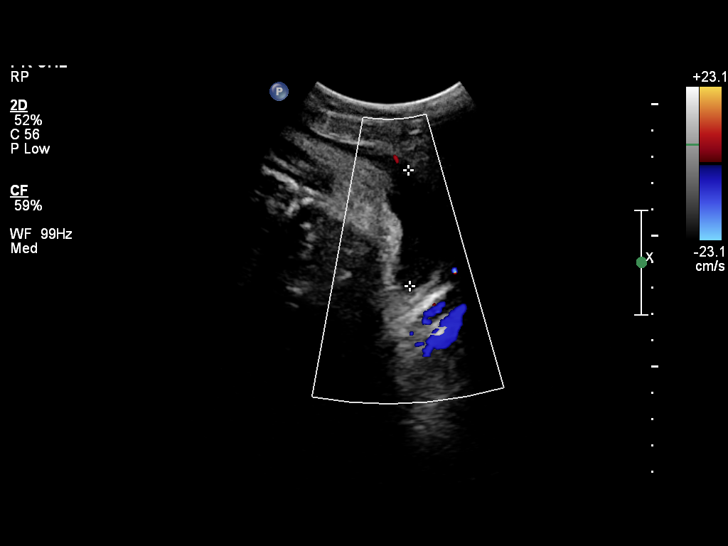
[im 21/23]
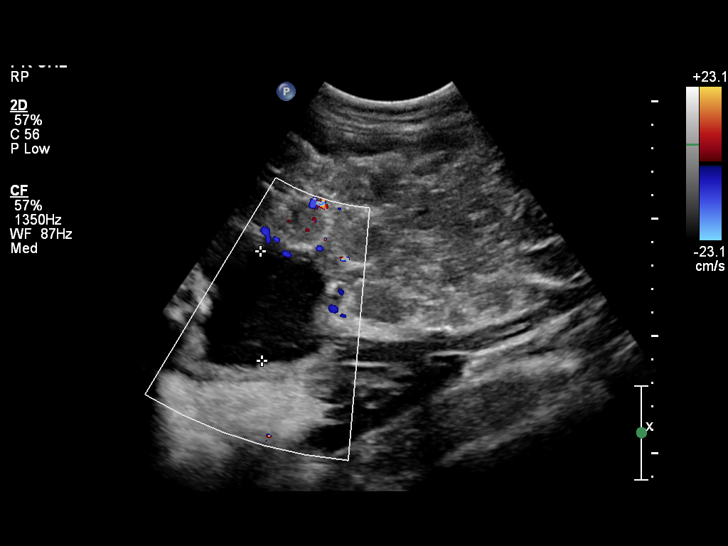
[im 23/23]
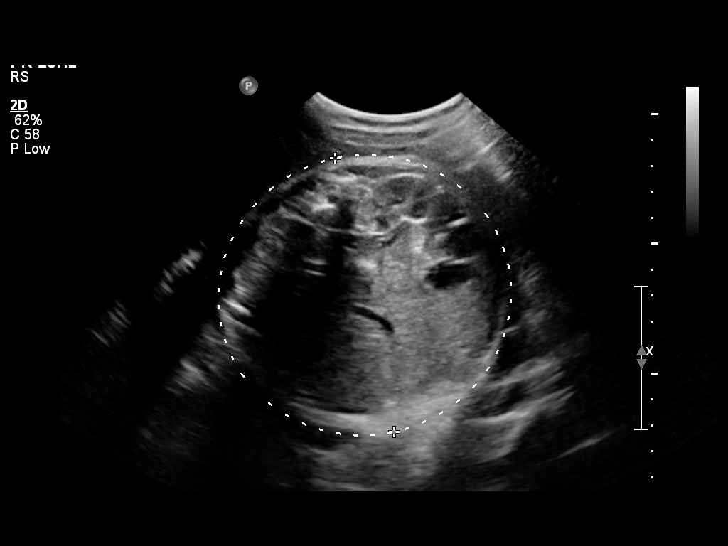

[13 of 23 positions shown; findings below may reference images not displayed]

OBSTETRICS REPORT
                      (Signed Final 03/15/2011 [DATE])

 Order#:         [id number]
Procedures

 US OB FOLLOW UP                                       76816.1
Indications

 Poor obstetric history: Previous IUFD (stillbirth)
 Assess Fetal Growth / Estimated Fetal Weight
 Sickle cell trait
Fetal Evaluation

 Fetal Heart Rate:  165                         bpm
 Cardiac Activity:  Observed
 Presentation:      Cephalic
 Placenta:          Anterior, above cervical os
 P. Cord            Previously Visualized
 Insertion:

 Amniotic Fluid
 AFI FV:      Subjectively within normal limits
 AFI Sum:     13.56   cm      49   %Tile     Larg Pckt:   4.65   cm
 RUQ:   4.51   cm    LUQ:    4.4    cm    LLQ:   4.65    cm
Biometry

 BPD:     88.6  mm    G. Age:   35w 6d                CI:        75.64   70 - 86
                                                      FL/HC:      22.4   20.8 -

 HC:       323  mm    G. Age:   36w 4d       22  %    HC/AC:      0.93   0.92 -

 AC:     346.4  mm    G. Age:   38w 4d       96  %    FL/BPD:     81.8   71 - 87
 FL:      72.5  mm    G. Age:   37w 1d       61  %    FL/AC:      20.9   20 - 24

 Est. FW:    8937  gm      7 lb 3 oz     84  %
Gestational Age

 LMP:           36w 4d       Date:   07/02/10                 EDD:   04/08/11
 U/S Today:     37w 0d                                        EDD:   04/05/11
 Best:          36w 4d    Det. By:   LMP  (07/02/10)          EDD:   04/08/11
Anatomy
 Cranium:           Appears normal      Aortic Arch:       Previously seen
 Fetal Cavum:       Previously seen     Ductal Arch:       Previously seen
 Ventricles:        Appears normal      Diaphragm:         Appears normal
 Choroid Plexus:    Previously seen     Stomach:           Appears normal
 Cerebellum:        Previously seen     Abdomen:           Appears normal
 Posterior Fossa:   Previously seen     Abdominal Wall:    Previously seen
 Nuchal Fold:       Previously seen     Cord Vessels:      Previously seen
 Face:              Previously seen     Kidneys:           Appear normal
 Heart:             Previously seen     Bladder:           Appears normal
 RVOT:              Previously seen     Spine:             Previously seen
 LVOT:              Previously seen     Limbs:             Previously seen

 Other:     Heels and 5th digit previously seen. Technically difficult
            due to advanced GA and fetal position.
Cervix Uterus Adnexa

 Cervix:       Not visualized (advanced GA >34 wks)

 Adnexa:     No abnormality visualized.
Impression

 Assigned GA is currently 36w 4d.   Continued upward trend of
 fetal growth curve, with EFW currently at 84 %ile.
 Amniotic fluid within normal limits, with AFI of 13.56 cm.

 questions or concerns.

## 2013-07-13 ENCOUNTER — Encounter (HOSPITAL_COMMUNITY): Payer: Self-pay

## 2013-07-13 ENCOUNTER — Inpatient Hospital Stay (HOSPITAL_COMMUNITY)
Admission: AD | Admit: 2013-07-13 | Discharge: 2013-07-13 | Disposition: A | Discharge status: Home / Self Care | Payer: Medicaid Other | Source: Ambulatory Visit | Attending: Family Medicine | Admitting: Family Medicine

## 2013-07-13 DIAGNOSIS — Z309 Encounter for contraceptive management, unspecified: Secondary | ICD-10-CM

## 2013-07-13 DIAGNOSIS — A499 Bacterial infection, unspecified: Secondary | ICD-10-CM

## 2013-07-13 DIAGNOSIS — R109 Unspecified abdominal pain: Secondary | ICD-10-CM | POA: Insufficient documentation

## 2013-07-13 DIAGNOSIS — N760 Acute vaginitis: Secondary | ICD-10-CM | POA: Insufficient documentation

## 2013-07-13 DIAGNOSIS — B9689 Other specified bacterial agents as the cause of diseases classified elsewhere: Secondary | ICD-10-CM | POA: Insufficient documentation

## 2013-07-13 DIAGNOSIS — F17200 Nicotine dependence, unspecified, uncomplicated: Secondary | ICD-10-CM | POA: Insufficient documentation

## 2013-07-13 HISTORY — DX: Supervision of pregnancy with other poor reproductive or obstetric history, unspecified trimester: O09.299

## 2013-07-13 LAB — CBC
HCT: 39.4 % (ref 36.0–46.0)
Hemoglobin: 13.2 g/dL (ref 12.0–15.0)
MCH: 25.9 pg — ABNORMAL LOW (ref 26.0–34.0)
MCHC: 33.5 g/dL (ref 30.0–36.0)
MCV: 77.3 fL — ABNORMAL LOW (ref 78.0–100.0)
PLATELETS: 273 10*3/uL (ref 150–400)
RBC: 5.1 MIL/uL (ref 3.87–5.11)
RDW: 14.2 % (ref 11.5–15.5)
WBC: 4.6 10*3/uL (ref 4.0–10.5)

## 2013-07-13 LAB — URINALYSIS, ROUTINE W REFLEX MICROSCOPIC
BILIRUBIN URINE: NEGATIVE
Glucose, UA: NEGATIVE mg/dL
HGB URINE DIPSTICK: NEGATIVE
KETONES UR: NEGATIVE mg/dL
Leukocytes, UA: NEGATIVE
NITRITE: NEGATIVE
PROTEIN: NEGATIVE mg/dL
SPECIFIC GRAVITY, URINE: 1.02 (ref 1.005–1.030)
UROBILINOGEN UA: 0.2 mg/dL (ref 0.0–1.0)
pH: 6 (ref 5.0–8.0)

## 2013-07-13 LAB — WET PREP, GENITAL
Trich, Wet Prep: NONE SEEN
YEAST WET PREP: NONE SEEN

## 2013-07-13 LAB — POCT PREGNANCY, URINE: Preg Test, Ur: NEGATIVE

## 2013-07-13 LAB — HIV ANTIBODY (ROUTINE TESTING W REFLEX): HIV: NONREACTIVE

## 2013-07-13 MED ORDER — FLUCONAZOLE 150 MG PO TABS: 150.0000 mg | ORAL_TABLET | Freq: Every day | ORAL | Status: DC

## 2013-07-13 MED ORDER — NORGESTIMATE-ETH ESTRADIOL 0.25-35 MG-MCG PO TABS
1.0000 | ORAL_TABLET | Freq: Every day | ORAL | Status: DC
Start: 2013-07-13 — End: 2013-08-17

## 2013-07-13 MED ORDER — METRONIDAZOLE 500 MG PO TABS: 500.0000 mg | ORAL_TABLET | Freq: Two times a day (BID) | ORAL | Status: DC

## 2013-07-13 NOTE — MAU Provider Note (Signed)
History     CSN: [id number]  Arrival date and time: 07/13/13 1651   First Provider Initiated Contact with Patient 07/13/13 1746      Chief Complaint  Patient presents with  . Vaginal Discharge  . Abdominal Pain   HPI Comments: Evelyn [name] 22 y.o. [id number] presents to MAU with odorous vaginal discharge and low pelvic discomfort for one week. No change in sexual partner. No birth control. She had a TAB in early March at 12 weeks. Last unprotected intercourse was 2 days ago.   Vaginal Discharge The patient's primary symptoms include a vaginal discharge. Associated symptoms include abdominal pain.  Abdominal Pain      Past Medical History  Diagnosis Date  . Eczema   . Anemia   . Sickle cell trait   . Prior pregnancy with fetal demise     Past Surgical History  Procedure Laterality Date  . Dilation and curettage of uterus      Family History  Problem Relation Age of Onset  . Hypertension Father     History  Substance Use Topics  . Smoking status: Current Every Day Smoker -- 1.00 packs/day    Types: Cigarettes    Last Attempt to Quit: 05/22/2012  . Smokeless tobacco: Never Used  . Alcohol Use: No    Allergies: No Known Allergies  Prescriptions prior to admission  Medication Sig Dispense Refill  . albuterol (PROVENTIL HFA;VENTOLIN HFA) 108 (90 BASE) MCG/ACT inhaler Inhale 2 puffs into the lungs every 6 (six) hours as needed for wheezing.  1 Inhaler  2  . ibuprofen (ADVIL,MOTRIN) 600 MG tablet Take 1 tablet (600 mg total) by mouth every 6 (six) hours.  30 tablet  0  . Prenatal Vit-Fe Fumarate-FA (PRENATAL MULTIVITAMIN) TABS Take 1 tablet by mouth daily at 12 noon.        Review of Systems  Constitutional: Negative.   HENT: Negative.   Eyes: Negative.   Respiratory: Negative.   Cardiovascular: Negative.   Gastrointestinal: Positive for abdominal pain.  Genitourinary: Positive for vaginal discharge.       Odorous vaginal discharge  Musculoskeletal: Negative.    Skin: Negative.   Neurological: Negative.   Psychiatric/Behavioral: Negative.    Physical Exam   Blood pressure 113/56, pulse 68, resp. rate 16, height 5' 6.5" (1.689 m), weight 60.419 kg (133 lb 3.2 oz), last menstrual period 06/07/2013, SpO2 100.00%, unknown if currently breastfeeding.  Physical Exam  Constitutional: She appears well-developed and well-nourished. No distress.  HENT:  Head: Normocephalic and atraumatic.  Eyes: Pupils are equal, round, and reactive to light.  GI: Soft. She exhibits no distension and no mass. There is no tenderness. There is no rebound and no guarding.  Genitourinary:  Genital:External negative Vaginal:moderate amount thick white odorous discharge Cervix:closed/ mild CMT Bimanual:negative     Results for orders placed during the hospital encounter of 07/13/13 (from the past 24 hour(s))  URINALYSIS, ROUTINE W REFLEX MICROSCOPIC     Status: None   Collection Time    07/13/13  5:30 PM      Result Value Ref Range   Color, Urine YELLOW  YELLOW   APPearance CLEAR  CLEAR   Specific Gravity, Urine 1.020  1.005 - 1.030   pH 6.0  5.0 - 8.0   Glucose, UA NEGATIVE  NEGATIVE mg/dL   Hgb urine dipstick NEGATIVE  NEGATIVE   Bilirubin Urine NEGATIVE  NEGATIVE   Ketones, ur NEGATIVE  NEGATIVE mg/dL   Protein, ur NEGATIVE  NEGATIVE mg/dL  Urobilinogen, UA 0.2  0.0 - 1.0 mg/dL   Nitrite NEGATIVE  NEGATIVE   Leukocytes, UA NEGATIVE  NEGATIVE  POCT PREGNANCY, URINE     Status: None   Collection Time    07/13/13  5:36 PM      Result Value Ref Range   Preg Test, Ur NEGATIVE  NEGATIVE  WET PREP, GENITAL     Status: Abnormal   Collection Time    07/13/13  5:54 PM      Result Value Ref Range   Yeast Wet Prep HPF POC NONE SEEN  NONE SEEN   Trich, Wet Prep NONE SEEN  NONE SEEN   Clue Cells Wet Prep HPF POC MODERATE (*) NONE SEEN   WBC, Wet Prep HPF POC MODERATE (*) NONE SEEN  CBC     Status: Abnormal   Collection Time    07/13/13  6:05 PM      Result  Value Ref Range   WBC 4.6  4.0 - 10.5 K/uL   RBC 5.10  3.87 - 5.11 MIL/uL   Hemoglobin 13.2  12.0 - 15.0 g/dL   HCT 16.139.4  09.636.0 - 04.546.0 %   MCV 77.3 (*) 78.0 - 100.0 fL   MCH 25.9 (*) 26.0 - 34.0 pg   MCHC 33.5  30.0 - 36.0 g/dL   RDW 40.914.2  81.111.5 - 91.415.5 %   Platelets 273  150 - 400 K/uL     MAU Course  Procedures  MDM  Wet Prep, GC, Chlamydia  Assessment and Plan   A: Bacterial Vaginosis Contraception Management  P: Flagly 500 mg PO BID x 7 days No alcohol or intercourse x 7 days Diflucan 150 mg po one time after antibiotic Orthocyclen BCPs to start Sunday after menses Condoms for first pack Follow up with [facility]/ [facility]   Doralee AlbinoLinda M Aj Crunkleton 07/13/2013, 5:57 PM

## 2013-07-13 NOTE — MAU Note (Signed)
Patient states she terminated a pregnancy in March but did not go for the follow up. States she has been having a vaginal discharge with an odor and abdominal pain for about one week. Denies bleeding.

## 2013-07-13 NOTE — MAU Note (Signed)
Pt states had TAB at 12 weeks. Has white vaginal discharge with odor noted for 1 week. Has been having intermittent lower abdominal cramping as well though none right now.

## 2013-07-13 NOTE — Discharge Instructions (Signed)
Bacterial Vaginosis Bacterial vaginosis is an infection of the vagina. It happens when too many of certain germs (bacteria) grow in the vagina. HOME CARE  Take your medicine as told by your doctor.  Finish your medicine even if you start to feel better.  Do not have sex until you finish your medicine and are better.  Tell your sex partner that you have an infection. They should see their doctor for treatment.  Practice safe sex. Use condoms. Have only one sex partner. GET HELP IF:  You are not getting better after 3 days of treatment.  You have more grey fluid (discharge) coming from your vagina than before.  You have more pain than before.  You have a fever. MAKE SURE YOU:   Understand these instructions.  Will watch your condition.  Will get help right away if you are not doing well or get worse. Document Released: 12/02/2007 Document Revised: 12/13/2012 Document Reviewed: 10/04/2012 [facility] Patient Information 2014 [place], [place]. Contraception Choices Birth control (contraception) is the use of any methods or devices to stop pregnancy from happening. Below are some methods to help avoid pregnancy. HORMONAL BIRTH CONTROL  A small tube put under the skin of the upper arm (implant). The tube can stay in place for 3 years. The implant must be taken out after 3 years.  Shots given every 3 months.  Pills taken every day.  Patches that are changed once a week.  A ring put into the vagina (vaginal ring). The ring is left in place for 3 weeks and removed for 1 week. Then, a new ring is put in the vagina.  Emergency birth control pills taken after unprotected sex (intercourse). BARRIER BIRTH CONTROL   A thin covering worn on the penis (female condom) during sex.  A soft, loose covering put into the vagina (female condom) before sex.  A rubber bowl that sits over the cervix (diaphragm). The bowl must be made for you. The bowl is put into the vagina before sex. The bowl is  left in place for 6 to 8 hours after sex.  A small, soft cup that fits over the cervix (cervical cap). The cup must be made for you. The cup can be left in place for 48 hours after sex.  A sponge that is put into the vagina before sex.  A chemical that kills or stops sperm from getting into the cervix and uterus (spermicide). The chemical may be a cream, jelly, foam, or pill. INTRAUTERINE (IUD) BIRTH CONTROL   IUD birth control is a small, T-shaped piece of plastic. The plastic is put inside the uterus. There are 2 types of IUD:  Copper IUD. The IUD is covered in copper wire. The copper makes a fluid that kills sperm. It can stay in place for 10 years.  Hormone IUD. The hormone stops pregnancy from happening. It can stay in place for 5 years. PERMANENT METHODS  When the woman has her fallopian tubes sealed, tied, or blocked during surgery. This stops the egg from traveling to the uterus.  The doctor places a small coil or insert into each fallopian tube. This causes scar tissue to form and blocks the fallopian tubes.  When the female has the tubes that carry sperm tied off (vasectomy). NATURAL FAMILY PLANNING BIRTH CONTROL   Natural family planning means not having sex or using barrier birth control on the days the woman could become pregnant.  Use a calendar to keep track of the length of each period  and know the days she can get pregnant.  Avoid sex during ovulation.  Use a thermometer to measure body temperature. Also watch for symptoms of ovulation.  Time sex to be after the woman has ovulated. Use condoms to help protect yourself against sexually transmitted infections (STIs). Do this no matter what type of birth control you use. Talk to your doctor about which type of birth control is best for you. Document Released: 12/20/2008 Document Revised: 10/25/2012 Document Reviewed: 09/13/2012 [facility] Patient Information 2014 [place]Care, [place].

## 2013-07-14 LAB — GC/CHLAMYDIA PROBE AMP
CT PROBE, AMP APTIMA: POSITIVE — AB
GC PROBE AMP APTIMA: NEGATIVE

## 2013-07-14 NOTE — MAU Provider Note (Signed)
Attestation of Attending Supervision of Advanced Practitioner (PA/CNM/NP): Evaluation and management procedures were performed by the Advanced Practitioner under my supervision and collaboration.  I have reviewed the Advanced Practitioner's note and chart, and I agree with the management and plan.  Garrit Marrow S Jeovany Huitron, MD [facility] Attending 07/14/2013 9:12 AM   

## 2013-07-16 ENCOUNTER — Encounter (HOSPITAL_COMMUNITY): Payer: Self-pay | Admitting: *Deleted

## 2013-07-19 ENCOUNTER — Telehealth: Payer: Self-pay | Admitting: General Practice

## 2013-07-19 DIAGNOSIS — A749 Chlamydial infection, unspecified: Secondary | ICD-10-CM

## 2013-07-19 MED ORDER — AZITHROMYCIN 250 MG PO TABS: 1000.0000 mg | ORAL_TABLET | Freq: Once | ORAL | Status: DC

## 2013-07-19 NOTE — Telephone Encounter (Signed)
Patient called and wanted her test results, nurse not available at the time and was sent to nurse line. Patient does have chlamydia and needs to be informed. Called patient, no answer- left message that we are trying to return your phone call, please call us back at the clinics

## 2013-07-19 NOTE — Telephone Encounter (Signed)
Message copied by Kathee DeltonHILLMAN, CARRIE L on Thu Jul 19, 2013  2:44 PM ------      Message from: BrewsterSMITH, MARNI W      Created: Thu Jul 19, 2013  1:54 PM       Patient returned call.  Updated phone number in system.  Notified patient of positive chlamydia culture.  Patient has not been treated and will need Rx called in per protocol to [facility] [address]  Instructed patient to notify her partner for treatment. ------

## 2013-07-19 NOTE — Telephone Encounter (Signed)
Med ordered

## 2013-08-17 ENCOUNTER — Emergency Department (HOSPITAL_COMMUNITY)
Admission: EM | Admit: 2013-08-17 | Discharge: 2013-08-17 | Disposition: A | Discharge status: Home / Self Care | Payer: Medicaid Other | Attending: Emergency Medicine | Admitting: Emergency Medicine

## 2013-08-17 ENCOUNTER — Emergency Department (HOSPITAL_COMMUNITY): Payer: Medicaid Other

## 2013-08-17 ENCOUNTER — Encounter (HOSPITAL_COMMUNITY): Payer: Self-pay | Admitting: Emergency Medicine

## 2013-08-17 DIAGNOSIS — Y929 Unspecified place or not applicable: Secondary | ICD-10-CM | POA: Insufficient documentation

## 2013-08-17 DIAGNOSIS — Z862 Personal history of diseases of the blood and blood-forming organs and certain disorders involving the immune mechanism: Secondary | ICD-10-CM | POA: Insufficient documentation

## 2013-08-17 DIAGNOSIS — F17200 Nicotine dependence, unspecified, uncomplicated: Secondary | ICD-10-CM | POA: Insufficient documentation

## 2013-08-17 DIAGNOSIS — Z79899 Other long term (current) drug therapy: Secondary | ICD-10-CM | POA: Insufficient documentation

## 2013-08-17 DIAGNOSIS — Z3202 Encounter for pregnancy test, result negative: Secondary | ICD-10-CM | POA: Insufficient documentation

## 2013-08-17 DIAGNOSIS — Y939 Activity, unspecified: Secondary | ICD-10-CM | POA: Insufficient documentation

## 2013-08-17 DIAGNOSIS — N12 Tubulo-interstitial nephritis, not specified as acute or chronic: Secondary | ICD-10-CM | POA: Insufficient documentation

## 2013-08-17 DIAGNOSIS — M549 Dorsalgia, unspecified: Secondary | ICD-10-CM

## 2013-08-17 DIAGNOSIS — IMO0002 Reserved for concepts with insufficient information to code with codable children: Secondary | ICD-10-CM | POA: Insufficient documentation

## 2013-08-17 DIAGNOSIS — Z872 Personal history of diseases of the skin and subcutaneous tissue: Secondary | ICD-10-CM | POA: Insufficient documentation

## 2013-08-17 DIAGNOSIS — W108XXA Fall (on) (from) other stairs and steps, initial encounter: Secondary | ICD-10-CM | POA: Insufficient documentation

## 2013-08-17 LAB — BASIC METABOLIC PANEL
BUN: 6 mg/dL (ref 6–23)
CALCIUM: 9.1 mg/dL (ref 8.4–10.5)
CO2: 25 meq/L (ref 19–32)
Chloride: 97 mEq/L (ref 96–112)
Creatinine, Ser: 0.89 mg/dL (ref 0.50–1.10)
GFR calc Af Amer: >90 mL/min (ref >90)
Glucose, Bld: 80 mg/dL (ref 70–99)
POTASSIUM: 3.4 meq/L — AB (ref 3.7–5.3)
SODIUM: 137 meq/L (ref 137–147)

## 2013-08-17 LAB — CBC WITH DIFFERENTIAL/PLATELET
BASOS ABS: 0 10*3/uL (ref 0.0–0.1)
Basophils Relative: 0 % (ref 0–1)
EOS ABS: 0 10*3/uL (ref 0.0–0.7)
EOS PCT: 0 % (ref 0–5)
HCT: 38.7 % (ref 36.0–46.0)
Hemoglobin: 12.9 g/dL (ref 12.0–15.0)
LYMPHS ABS: 1 10*3/uL (ref 0.7–4.0)
LYMPHS PCT: 9 % — AB (ref 12–46)
MCH: 25.5 pg — ABNORMAL LOW (ref 26.0–34.0)
MCHC: 33.3 g/dL (ref 30.0–36.0)
MCV: 76.5 fL — AB (ref 78.0–100.0)
Monocytes Absolute: 1.9 10*3/uL — ABNORMAL HIGH (ref 0.1–1.0)
Monocytes Relative: 16 % — ABNORMAL HIGH (ref 3–12)
NEUTROS PCT: 75 % (ref 43–77)
Neutro Abs: 8.7 10*3/uL — ABNORMAL HIGH (ref 1.7–7.7)
PLATELETS: 257 10*3/uL (ref 150–400)
RBC: 5.06 MIL/uL (ref 3.87–5.11)
RDW: 14.2 % (ref 11.5–15.5)
WBC: 11.6 10*3/uL — AB (ref 4.0–10.5)

## 2013-08-17 LAB — URINALYSIS, ROUTINE W REFLEX MICROSCOPIC
BILIRUBIN URINE: NEGATIVE
Glucose, UA: NEGATIVE mg/dL
Ketones, ur: NEGATIVE mg/dL
NITRITE: POSITIVE — AB
PH: 6 (ref 5.0–8.0)
Protein, ur: 100 mg/dL — AB
SPECIFIC GRAVITY, URINE: 1.014 (ref 1.005–1.030)
UROBILINOGEN UA: 1 mg/dL (ref 0.0–1.0)

## 2013-08-17 LAB — URINE MICROSCOPIC-ADD ON

## 2013-08-17 LAB — PREGNANCY, URINE: Preg Test, Ur: NEGATIVE

## 2013-08-17 MED ORDER — IBUPROFEN 800 MG PO TABS
800.0000 mg | ORAL_TABLET | Freq: Once | ORAL | Status: AC
  Administered 2013-08-17: 800 mg via ORAL
  Filled 2013-08-17: qty 1

## 2013-08-17 MED ORDER — SODIUM CHLORIDE 0.9 % IV BOLUS (SEPSIS)
1000.0000 mL | Freq: Once | INTRAVENOUS | Status: AC
  Administered 2013-08-17: 1000 mL via INTRAVENOUS

## 2013-08-17 MED ORDER — SODIUM CHLORIDE 0.9 % IV SOLN
Freq: Once | INTRAVENOUS | Status: AC
  Administered 2013-08-17: 20:00:00 via INTRAVENOUS

## 2013-08-17 MED ORDER — DEXTROSE 5 % IV SOLN
1.0000 g | Freq: Once | INTRAVENOUS | Status: AC
  Administered 2013-08-17: 1 g via INTRAVENOUS
  Filled 2013-08-17: qty 10

## 2013-08-17 MED ORDER — CEFPODOXIME PROXETIL 100 MG PO TABS: 100.0000 mg | ORAL_TABLET | Freq: Two times a day (BID) | ORAL | Status: DC

## 2013-08-17 MED ORDER — HYDROCODONE-ACETAMINOPHEN 5-325 MG PO TABS: 1.0000 | ORAL_TABLET | Freq: Four times a day (QID) | ORAL | Status: DC | PRN

## 2013-08-17 MED ORDER — ACETAMINOPHEN 325 MG PO TABS
650.0000 mg | ORAL_TABLET | Freq: Four times a day (QID) | ORAL | Status: DC | PRN
  Administered 2013-08-17: 650 mg via ORAL
  Filled 2013-08-17: qty 2

## 2013-08-17 NOTE — Discharge Instructions (Signed)
Pyelonephritis, Adult Pyelonephritis is a kidney infection. In general, there are 2 main types of pyelonephritis:  Infections that come on quickly without any warning (acute pyelonephritis).  Infections that persist for a long period of time (chronic pyelonephritis). CAUSES  Two main causes of pyelonephritis are:  Bacteria traveling from the bladder to the kidney. This is a problem especially in pregnant women. The urine in the bladder can become filled with bacteria from multiple causes, including:  Inflammation of the prostate gland (prostatitis).  Sexual intercourse in females.  Bladder infection (cystitis).  Bacteria traveling from the bloodstream to the tissue part of the kidney. Problems that may increase your risk of getting a kidney infection include:  Diabetes.  Kidney stones or bladder stones.  Cancer.  Catheters placed in the bladder.  Other abnormalities of the kidney or ureter. SYMPTOMS   Abdominal pain.  Pain in the side or flank area.  Fever.  Chills.  Upset stomach.  Blood in the urine (dark urine).  Frequent urination.  Strong or persistent urge to urinate.  Burning or stinging when urinating. DIAGNOSIS  Your caregiver may diagnose your kidney infection based on your symptoms. A urine sample may also be taken. TREATMENT  In general, treatment depends on how severe the infection is.   If the infection is mild and caught early, your caregiver may treat you with oral antibiotics and send you home.  If the infection is more severe, the bacteria may have gotten into the bloodstream. This will require intravenous (IV) antibiotics and a hospital stay. Symptoms may include:  High fever.  Severe flank pain.  Shaking chills.  Even after a hospital stay, your caregiver may require you to be on oral antibiotics for a period of time.  Other treatments may be required depending upon the cause of the infection. HOME CARE INSTRUCTIONS   Take your  antibiotics as directed. Finish them even if you start to feel better.  Make an appointment to have your urine checked to make sure the infection is gone.  Drink enough fluids to keep your urine clear or pale yellow.  Take medicines for the bladder if you have urgency and frequency of urination as directed by your caregiver. SEEK IMMEDIATE MEDICAL CARE IF:   You have a fever or persistent symptoms for more than 2-3 days.  You have a fever and your symptoms suddenly get worse.  You are unable to take your antibiotics or fluids.  You develop shaking chills.  You experience extreme weakness or fainting.  There is no improvement after 2 days of treatment. MAKE SURE YOU:  Understand these instructions.  Will watch your condition.  Will get help right away if you are not doing well or get worse. Document Released: 02/22/2005 Document Revised: 08/24/2011 Document Reviewed: 07/29/2010 [facility]lookExitCare Patient Information 2014 [place]ExitCare, [place].  Back Pain, Adult Low back pain is very common. About 1 in 5 people have back pain.The cause of low back pain is rarely dangerous. The pain often gets better over time.About half of people with a sudden onset of back pain feel better in just 2 weeks. About 8 in 10 people feel better by 6 weeks.  CAUSES Some common causes of back pain include:  Strain of the muscles or ligaments supporting the spine.  Wear and tear (degeneration) of the spinal discs.  Arthritis.  Direct injury to the back. DIAGNOSIS Most of the time, the direct cause of low back pain is not known.However, back pain can be treated effectively even  when the exact cause of the pain is unknown.Answering your caregiver's questions about your overall health and symptoms is one of the most accurate ways to make sure the cause of your pain is not dangerous. If your caregiver needs more information, he or she may order lab work or imaging tests (X-rays or MRIs).However, even if imaging  tests show changes in your back, this usually does not require surgery. HOME CARE INSTRUCTIONS For many people, back pain returns.Since low back pain is rarely dangerous, it is often a condition that people can learn to [facility] Systemmanageon their own.   Remain active. It is stressful on the back to sit or stand in one place. Do not sit, drive, or stand in one place for more than 30 minutes at a time. Take short walks on level surfaces as soon as pain allows.Try to increase the length of time you walk each day.  Do not stay in bed.Resting more than 1 or 2 days can delay your recovery.  Do not avoid exercise or work.Your body is made to move.It is not dangerous to be active, even though your back may hurt.Your back will likely heal faster if you return to being active before your pain is gone.  Pay attention to your body when you bend and lift. Many people have less discomfortwhen lifting if they bend their knees, keep the load close to their bodies,and avoid twisting. Often, the most comfortable positions are those that put less stress on your recovering back.  Find a comfortable position to sleep. Use a firm mattress and lie on your side with your knees slightly bent. If you lie on your back, put a pillow under your knees.  Only take over-the-counter or prescription medicines as directed by your caregiver. Over-the-counter medicines to reduce pain and inflammation are often the most helpful.Your caregiver may prescribe muscle relaxant drugs.These medicines help dull your pain so you can more quickly return to your normal activities and healthy exercise.  Put ice on the injured area.  Put ice in a plastic bag.  Place a towel between your skin and the bag.  Leave the ice on for 15-20 minutes, 03-04 times a day for the first 2 to 3 days. After that, ice and heat may be alternated to reduce pain and spasms.  Ask your caregiver about trying back exercises and gentle massage. This may be of some  benefit.  Avoid feeling anxious or stressed.Stress increases muscle tension and can worsen back pain.It is important to recognize when you are anxious or stressed and learn ways to manage it.Exercise is a great option. SEEK MEDICAL CARE IF:  You have pain that is not relieved with rest or medicine.  You have pain that does not improve in 1 week.  You have new symptoms.  You are generally not feeling well. SEEK IMMEDIATE MEDICAL CARE IF:   You have pain that radiates from your back into your legs.  You develop new bowel or bladder control problems.  You have unusual weakness or numbness in your arms or legs.  You develop nausea or vomiting.  You develop abdominal pain.  You feel faint. Document Released: 02/22/2005 Document Revised: 08/24/2011 Document Reviewed: 07/13/2010 [facility]Exit[facility] Patient Information 2014 [place]ExitCare, [place]LLC.

## 2013-08-17 NOTE — ED Notes (Addendum)
Pt arrives from home for eval of back pain after falling on the steps Tuesday, pt states she has been in bed ever since. Pt denies any numbness or tingling to extremities. Pt present with fever during triage. Nad noted. Pt reports abdominal pain since that started Monday as well with radiation to back and pain in hips. Denies any urinary symptoms, pt is currently sexually active, LMP 08/13/13.

## 2013-08-17 NOTE — ED Provider Notes (Signed)
CSN: [id number]     Arrival date & time 08/17/13  1529 History   First MD Initiated Contact with Patient 08/17/13 1537     Chief Complaint  Patient presents with  . Back Pain     (Consider location/radiation/quality/duration/timing/severity/associated sxs/prior Treatment) Patient is a 23 y.o. female presenting with back pain. The history is provided by the patient. No language interpreter was used.  Back Pain Location:  Lumbar spine Quality:  Aching Radiates to: across back & into low abdomen. Pain severity:  Moderate Pain is:  Same all the time Onset quality:  Sudden Duration:  3 days Timing:  Constant Progression:  Worsening Chronicity:  New Context: falling   Relieved by:  Nothing Worsened by:  Movement Ineffective treatments:  Lying down Associated symptoms: abdominal pain, dysuria and fever   Associated symptoms: no bladder incontinence, no bowel incontinence, no chest pain, no headaches, no leg pain, no numbness, no paresthesias, no pelvic pain, no perianal numbness, no tingling, no weakness and no weight loss   Abdominal pain:    Location:  Suprapubic   Quality:  Dull   Severity:  Mild   Onset quality:  Gradual   Duration:  3 days   Timing:  Constant   Progression:  Worsening Dysuria:    Severity:  Moderate   Onset quality:  Unable to specify   Duration:  3 days   Timing:  Constant   Progression:  Worsening   Chronicity:  New Fever:    Timing:  Unable to specify   Temp source:  Subjective   Progression:  Unable to specify Risk factors: no hx of cancer, no hx of osteoporosis, no lack of exercise, no menopause, not obese, not pregnant, no recent surgery, no steroid use and no vascular disease     Past Medical History  Diagnosis Date  . Eczema   . Anemia   . Sickle cell trait   . Prior pregnancy with fetal demise    Past Surgical History  Procedure Laterality Date  . Dilation and curettage of uterus     Family History  Problem Relation Age of Onset    . Hypertension Father    History  Substance Use Topics  . Smoking status: Current Every Day Smoker -- 1.00 packs/day    Types: Cigarettes    Last Attempt to Quit: 05/22/2012  . Smokeless tobacco: Never Used  . Alcohol Use: No   OB History   Grav Para Term Preterm Abortions TAB SAB Ect Mult Living   4 3 3  1 1    2      Review of Systems  Constitutional: Positive for fever. Negative for chills, weight loss, diaphoresis, activity change, appetite change and fatigue.  HENT: Negative for congestion, facial swelling, rhinorrhea and sore throat.   Eyes: Negative for photophobia and discharge.  Respiratory: Negative for cough, chest tightness and shortness of breath.   Cardiovascular: Negative for chest pain, palpitations and leg swelling.  Gastrointestinal: Positive for abdominal pain. Negative for nausea, vomiting, diarrhea and bowel incontinence.  Endocrine: Negative for polydipsia and polyuria.  Genitourinary: Positive for dysuria. Negative for bladder incontinence, frequency, difficulty urinating and pelvic pain.  Musculoskeletal: Positive for back pain. Negative for arthralgias, neck pain and neck stiffness.  Skin: Negative for color change and wound.  Allergic/Immunologic: Negative for immunocompromised state.  Neurological: Negative for tingling, facial asymmetry, weakness, numbness, headaches and paresthesias.  Hematological: Does not bruise/bleed easily.  Psychiatric/Behavioral: Negative for confusion and agitation.  Allergies  Review of patient's allergies indicates no known allergies.  Home Medications   Prior to Admission medications   Medication Sig Start Date End Date Taking? Authorizing Provider  albuterol (PROVENTIL HFA;VENTOLIN HFA) 108 (90 BASE) MCG/ACT inhaler Inhale 2 puffs into the lungs every 6 (six) hours as needed for wheezing. 06/05/12  Yes Elenora GammaSamuel L Bradshaw, MD  cefpodoxime (VANTIN) 100 MG tablet Take 1 tablet (100 mg total) by mouth 2 (two) times  daily. For full 10 days 08/17/13   Shanna CiscoMegan E Docherty, MD  HYDROcodone-acetaminophen (NORCO) 5-325 MG per tablet Take 1 tablet by mouth every 6 (six) hours as needed. 08/17/13   Shanna CiscoMegan E Docherty, MD   BP 111/70  Pulse 93  Temp(Src) 102.2 F (39 C) (Oral)  Resp 14  SpO2 97%  LMP 08/13/2013 Physical Exam  Constitutional: She is oriented to person, place, and time. She appears well-developed and well-nourished. No distress.  HENT:  Head: Normocephalic and atraumatic.  Mouth/Throat: No oropharyngeal exudate.  Eyes: Pupils are equal, round, and reactive to light.  Neck: Normal range of motion. Neck supple.  Cardiovascular: Normal rate, regular rhythm and normal heart sounds.  Exam reveals no gallop and no friction rub.   No murmur heard. Pulmonary/Chest: Effort normal and breath sounds normal. No respiratory distress. She has no wheezes. She has no rales.  Abdominal: Soft. Bowel sounds are normal. She exhibits no distension and no mass. There is tenderness in the suprapubic area. There is CVA tenderness (R>L). There is no rebound and no guarding.  Musculoskeletal: Normal range of motion. She exhibits no edema.       Lumbar back: She exhibits tenderness and bony tenderness.       Back:  Neurological: She is alert and oriented to person, place, and time.  Skin: Skin is warm and dry.  Psychiatric: She has a normal mood and affect.    ED Course  Procedures (including critical care time) Labs Review Labs Reviewed  URINALYSIS, ROUTINE W REFLEX MICROSCOPIC - Abnormal; Notable for the following:    APPearance CLOUDY (*)    Hgb urine dipstick LARGE (*)    Protein, ur 100 (*)    Nitrite POSITIVE (*)    Leukocytes, UA LARGE (*)    All other components within normal limits  CBC WITH DIFFERENTIAL - Abnormal; Notable for the following:    WBC 11.6 (*)    MCV 76.5 (*)    MCH 25.5 (*)    Neutro Abs 8.7 (*)    Lymphocytes Relative 9 (*)    Monocytes Relative 16 (*)    Monocytes Absolute 1.9 (*)     All other components within normal limits  BASIC METABOLIC PANEL - Abnormal; Notable for the following:    Potassium 3.4 (*)    All other components within normal limits  URINE MICROSCOPIC-ADD ON - Abnormal; Notable for the following:    Squamous Epithelial / LPF MANY (*)    Bacteria, UA MANY (*)    All other components within normal limits  PREGNANCY, URINE    Imaging Review Dg Lumbar Spine 2-3 Views  08/17/2013   CLINICAL DATA:  Pain.  EXAM: LUMBAR SPINE - 2-3 VIEW  COMPARISON:  None.  FINDINGS: The paraspinal soft tissues normal. Bony structures normal. Normal bony alignment. No evidence of fracture. Air-filled loops of small and large bowel present. Adynamic ileus could present in this fashion.  IMPRESSION: 1. No acute bony abnormality. 2. Cannot exclude adynamic ileus.   Electronically Signed  ByMaisie Fus  Register   On: 08/17/2013 19:54     EKG Interpretation None      MDM   Final diagnoses:  Pyelonephritis  Back pain    Pt is a 23 y.o. female with Pmhx as above who presents with 3 days of low back pain after fall, now w/ radiation to sides fo back into low abdomen, fever, dysuria. On PE, pt febrile, but in NAD. +ttp over spious process low lumbar spine, but also has R>L CVA tenderness and +suprapubic pain. UA infected. No focal neuro neuro findings, specifically, no bowel/bladder incontinence, saddle anesthesia, numbness, weakness. Have ordered XR back, though suspect early pyelonephritis is cause of symptoms. Doubt discitis, cord compression or cauda equina, though will given strict return precautions for worrisome neuro symptoms, inc pain.         Shanna Cisco, MD 08/18/13 1106

## 2014-01-07 ENCOUNTER — Encounter (HOSPITAL_COMMUNITY): Payer: Self-pay | Admitting: Emergency Medicine

## 2014-03-08 NOTE — L&D Delivery Note (Cosign Needed)
Delivery Note After a 15 minute 2nd stage, at 9:35 PM a viable female was delivered via Vaginal, Spontaneous Delivery (Presentation: Left Occiput Anterior). There was a tight nuchal cord X2, delivered through.  The shoulders were a little slow, so the posterior (left) axilla was grasped with my index finger, and the baby was rotated clockwise into the oblique diameter.  At this point, the (now) anterior shoulder was released, and the baby delivered.  At no time was any traction placed on the baby's head.  APGAR: 8, 9; weight pending.  40 units of pitocin diluted in 1000cc LR was infused rapidly IV.  The placenta separated spontaneously and delivered via CCT and maternal pushing effort.  It was inspected and appears to be intact with a 3 VC.   Marland Kitchen.   Anesthesia: Epidural  Episiotomy: None Lacerations: None Suture Repair: n/a Est. Blood Loss (mL): 100  Mom to postpartum.  Baby to Couplet care / Skin to Skin.  CRESENZO-DISHMAN,Madason Rauls 07/11/2014, 9:52 PM

## 2014-03-21 ENCOUNTER — Ambulatory Visit (INDEPENDENT_AMBULATORY_CARE_PROVIDER_SITE_OTHER): Payer: Self-pay

## 2014-03-21 ENCOUNTER — Ambulatory Visit: Payer: Self-pay | Admitting: Obstetrics & Gynecology

## 2014-03-21 DIAGNOSIS — N926 Irregular menstruation, unspecified: Secondary | ICD-10-CM

## 2014-03-21 DIAGNOSIS — Z3201 Encounter for pregnancy test, result positive: Secondary | ICD-10-CM

## 2014-03-21 LAB — OB RESULTS CONSOLE PLATELET COUNT: Platelets: 307 10*3/uL

## 2014-03-21 LAB — POCT PREGNANCY, URINE: Preg Test, Ur: POSITIVE — AB

## 2014-03-21 LAB — OB RESULTS CONSOLE HGB/HCT, BLOOD
HCT: 35 %
Hemoglobin: 11.6 g/dL

## 2014-03-21 NOTE — Progress Notes (Signed)
Patient here today for pregnancy test. Reports LMP was in August 2015-- unsure of exact date but thinks it was around the 9th. Reports some light bleeding in October but nothing since. UPT positive. Given 10/14/13, patient around 22 weeks today. Patient to have initial labs drawn today. Anatomy U/S scheduled for 03/28/13 at 1400. New OB appointment to be scheduled. Pregnancy verification letter given.

## 2014-03-22 LAB — PRENATAL PROFILE (SOLSTAS)
ANTIBODY SCREEN: NEGATIVE
BASOS PCT: 0 % (ref 0–1)
Basophils Absolute: 0 10*3/uL (ref 0.0–0.1)
EOS PCT: 4 % (ref 0–5)
Eosinophils Absolute: 0.4 10*3/uL (ref 0.0–0.7)
HEMATOCRIT: 34.6 % — AB (ref 36.0–46.0)
HIV: NONREACTIVE
Hemoglobin: 11.6 g/dL — ABNORMAL LOW (ref 12.0–15.0)
Hepatitis B Surface Ag: NEGATIVE
LYMPHS ABS: 1.1 10*3/uL (ref 0.7–4.0)
Lymphocytes Relative: 11 % — ABNORMAL LOW (ref 12–46)
MCH: 25.6 pg — ABNORMAL LOW (ref 26.0–34.0)
MCHC: 33.5 g/dL (ref 30.0–36.0)
MCV: 76.2 fL — AB (ref 78.0–100.0)
MONO ABS: 1 10*3/uL (ref 0.1–1.0)
MONOS PCT: 10 % (ref 3–12)
MPV: 9.9 fL (ref 8.6–12.4)
NEUTROS ABS: 7.7 10*3/uL (ref 1.7–7.7)
NEUTROS PCT: 75 % (ref 43–77)
Platelets: 307 10*3/uL (ref 150–400)
RBC: 4.54 MIL/uL (ref 3.87–5.11)
RDW: 14.9 % (ref 11.5–15.5)
RUBELLA: 0.81 {index} (ref <0.90)
Rh Type: POSITIVE
WBC: 10.2 10*3/uL (ref 4.0–10.5)

## 2014-03-23 LAB — PRESCRIPTION MONITORING PROFILE (19 PANEL)
AMPHETAMINE/METH: NEGATIVE ng/mL
BARBITURATE SCREEN, URINE: NEGATIVE ng/mL
Benzodiazepine Screen, Urine: NEGATIVE ng/mL
Buprenorphine, Urine: NEGATIVE ng/mL
CANNABINOID SCRN UR: NEGATIVE ng/mL
CARISOPRODOL, URINE: NEGATIVE ng/mL
Cocaine Metabolites: NEGATIVE ng/mL
Creatinine, Urine: 169.37 mg/dL (ref >20.0)
ECSTASY: NEGATIVE ng/mL
FENTANYL URINE: NEGATIVE ng/mL
MEPERIDINE UR: NEGATIVE ng/mL
METHADONE SCREEN, URINE: NEGATIVE ng/mL
METHAQUALONE SCREEN (URINE): NEGATIVE ng/mL
Nitrites, Initial: NEGATIVE ug/mL
OXYCODONE SCRN UR: NEGATIVE ng/mL
Opiate Screen, Urine: NEGATIVE ng/mL
PHENCYCLIDINE, UR: NEGATIVE ng/mL
PROPOXYPHENE: NEGATIVE ng/mL
TAPENTADOLUR: NEGATIVE ng/mL
TRAMADOL UR: NEGATIVE ng/mL
ZOLPIDEM, URINE: NEGATIVE ng/mL
pH, Initial: 6.4 pH (ref 4.5–8.9)

## 2014-03-23 LAB — CULTURE, OB URINE
Colony Count: NO GROWTH
ORGANISM ID, BACTERIA: NO GROWTH

## 2014-03-28 ENCOUNTER — Ambulatory Visit (HOSPITAL_COMMUNITY)
Admission: RE | Admit: 2014-03-28 | Discharge: 2014-03-28 | Disposition: A | Discharge status: Home / Self Care | Payer: Medicaid Other | Source: Ambulatory Visit | Attending: Obstetrics and Gynecology | Admitting: Obstetrics and Gynecology

## 2014-03-28 DIAGNOSIS — Z3A24 24 weeks gestation of pregnancy: Secondary | ICD-10-CM | POA: Insufficient documentation

## 2014-03-28 DIAGNOSIS — O0932 Supervision of pregnancy with insufficient antenatal care, second trimester: Secondary | ICD-10-CM | POA: Diagnosis not present

## 2014-03-28 DIAGNOSIS — O09292 Supervision of pregnancy with other poor reproductive or obstetric history, second trimester: Secondary | ICD-10-CM | POA: Insufficient documentation

## 2014-03-28 DIAGNOSIS — Z36 Encounter for antenatal screening of mother: Secondary | ICD-10-CM | POA: Insufficient documentation

## 2014-03-28 DIAGNOSIS — Z3201 Encounter for pregnancy test, result positive: Secondary | ICD-10-CM

## 2014-03-29 DIAGNOSIS — Z3A24 24 weeks gestation of pregnancy: Secondary | ICD-10-CM | POA: Insufficient documentation

## 2014-03-29 DIAGNOSIS — Z3689 Encounter for other specified antenatal screening: Secondary | ICD-10-CM | POA: Insufficient documentation

## 2014-04-11 ENCOUNTER — Encounter: Payer: Self-pay | Admitting: Obstetrics & Gynecology

## 2014-04-11 ENCOUNTER — Ambulatory Visit (INDEPENDENT_AMBULATORY_CARE_PROVIDER_SITE_OTHER): Payer: Medicaid Other | Admitting: Obstetrics & Gynecology

## 2014-04-11 VITALS — BP 106/46 | HR 72 | Temp 98.1°F | Wt 152.3 lb

## 2014-04-11 DIAGNOSIS — N898 Other specified noninflammatory disorders of vagina: Secondary | ICD-10-CM

## 2014-04-11 DIAGNOSIS — O0992 Supervision of high risk pregnancy, unspecified, second trimester: Secondary | ICD-10-CM

## 2014-04-11 DIAGNOSIS — Z113 Encounter for screening for infections with a predominantly sexual mode of transmission: Secondary | ICD-10-CM

## 2014-04-11 DIAGNOSIS — Z124 Encounter for screening for malignant neoplasm of cervix: Secondary | ICD-10-CM

## 2014-04-11 DIAGNOSIS — Z118 Encounter for screening for other infectious and parasitic diseases: Secondary | ICD-10-CM

## 2014-04-11 LAB — POCT URINALYSIS DIP (DEVICE)
Bilirubin Urine: NEGATIVE
Glucose, UA: NEGATIVE mg/dL
HGB URINE DIPSTICK: NEGATIVE
KETONES UR: NEGATIVE mg/dL
Leukocytes, UA: NEGATIVE
Nitrite: NEGATIVE
Protein, ur: NEGATIVE mg/dL
SPECIFIC GRAVITY, URINE: 1.02 (ref 1.005–1.030)
Urobilinogen, UA: 0.2 mg/dL (ref 0.0–1.0)
pH: 6.5 (ref 5.0–8.0)

## 2014-04-11 NOTE — Progress Notes (Signed)
Here for first visit. Given new patient booklets. C/o liquidy discharge for about a week- states when it dries it looks brownish and has a bad odor.

## 2014-04-11 NOTE — Progress Notes (Signed)
Follow-up U/S 04/25/14 @1245p  with Radiology.

## 2014-04-11 NOTE — Patient Instructions (Signed)
Second Trimester of Pregnancy The second trimester is from week 13 through week 28, months 4 through 6. The second trimester is often a time when you feel your best. Your body has also adjusted to being pregnant, and you begin to feel better physically. Usually, morning sickness has lessened or quit completely, you may have more energy, and you may have an increase in appetite. The second trimester is also a time when the fetus is growing rapidly. At the end of the sixth month, the fetus is about 9 inches long and weighs about 1 pounds. You will likely begin to feel the baby move (quickening) between 18 and 20 weeks of the pregnancy. BODY CHANGES Your body goes through many changes during pregnancy. The changes vary from woman to woman.   Your weight will continue to increase. You will notice your lower abdomen bulging out.  You may begin to get stretch marks on your hips, abdomen, and breasts.  You may develop headaches that can be relieved by medicines approved by your health care provider.  You may urinate more often because the fetus is pressing on your bladder.  You may develop or continue to have heartburn as a result of your pregnancy.  You may develop constipation because certain hormones are causing the muscles that push waste through your intestines to slow down.  You may develop hemorrhoids or swollen, bulging veins (varicose veins).  You may have back pain because of the weight gain and pregnancy hormones relaxing your joints between the bones in your pelvis and as a result of a shift in weight and the muscles that support your balance.  Your breasts will continue to grow and be tender.  Your gums may bleed and may be sensitive to brushing and flossing.  Dark spots or blotches (chloasma, mask of pregnancy) may develop on your face. This will likely fade after the baby is born.  A dark line from your belly button to the pubic area (linea nigra) may appear. This will likely fade  after the baby is born.  You may have changes in your hair. These can include thickening of your hair, rapid growth, and changes in texture. Some women also have hair loss during or after pregnancy, or hair that feels dry or thin. Your hair will most likely return to normal after your baby is born. WHAT TO EXPECT AT YOUR PRENATAL VISITS During a routine prenatal visit:  You will be weighed to make sure you and the fetus are growing normally.  Your blood pressure will be taken.  Your abdomen will be measured to track your baby's growth.  The fetal heartbeat will be listened to.  Any test results from the previous visit will be discussed. Your health care provider may ask you:  How you are feeling.  If you are feeling the baby move.  If you have had any abnormal symptoms, such as leaking fluid, bleeding, severe headaches, or abdominal cramping.  If you have any questions. Other tests that may be performed during your second trimester include:  Blood tests that check for:  Low iron levels (anemia).  Gestational diabetes (between 24 and 28 weeks).  Rh antibodies.  Urine tests to check for infections, diabetes, or protein in the urine.  An ultrasound to confirm the proper growth and development of the baby.  An amniocentesis to check for possible genetic problems.  Fetal screens for spina bifida and Down syndrome. HOME CARE INSTRUCTIONS   Avoid all smoking, herbs, alcohol, and unprescribed   drugs. These chemicals affect the formation and growth of the baby.  Follow your health care provider's instructions regarding medicine use. There are medicines that are either safe or unsafe to take during pregnancy.  Exercise only as directed by your health care provider. Experiencing uterine cramps is a good sign to stop exercising.  Continue to eat regular, healthy meals.  Wear a good support bra for breast tenderness.  Do not use hot tubs, steam rooms, or saunas.  Wear your  seat belt at all times when driving.  Avoid raw meat, uncooked cheese, cat litter boxes, and soil used by cats. These carry germs that can cause birth defects in the baby.  Take your prenatal vitamins.  Try taking a stool softener (if your health care provider approves) if you develop constipation. Eat more high-fiber foods, such as fresh vegetables or fruit and whole grains. Drink plenty of fluids to keep your urine clear or pale yellow.  Take warm sitz baths to soothe any pain or discomfort caused by hemorrhoids. Use hemorrhoid cream if your health care provider approves.  If you develop varicose veins, wear support hose. Elevate your feet for 15 minutes, 3-4 times a day. Limit salt in your diet.  Avoid heavy lifting, wear low heel shoes, and practice good posture.  Rest with your legs elevated if you have leg cramps or low back pain.  Visit your dentist if you have not gone yet during your pregnancy. Use a soft toothbrush to brush your teeth and be gentle when you floss.  A sexual relationship may be continued unless your health care provider directs you otherwise.  Continue to go to all your prenatal visits as directed by your health care provider. SEEK MEDICAL CARE IF:   You have dizziness.  You have mild pelvic cramps, pelvic pressure, or nagging pain in the abdominal area.  You have persistent nausea, vomiting, or diarrhea.  You have a bad smelling vaginal discharge.  You have pain with urination. SEEK IMMEDIATE MEDICAL CARE IF:   You have a fever.  You are leaking fluid from your vagina.  You have spotting or bleeding from your vagina.  You have severe abdominal cramping or pain.  You have rapid weight gain or loss.  You have shortness of breath with chest pain.  You notice sudden or extreme swelling of your face, hands, ankles, feet, or legs.  You have not felt your baby move in over an hour.  You have severe headaches that do not go away with  medicine.  You have vision changes. Document Released: 02/16/2001 Document Revised: 02/27/2013 Document Reviewed: 04/25/2012 Exit[employer] Patient Information 2015 [employer]. This information is not intended to replace advice given to you by your health care provider. Make sure you discuss any questions you have with your health care provider.  

## 2014-04-11 NOTE — Progress Notes (Signed)
Vaginal discharge on exam, pap done . New OB, H/O IUFD term. Dating 24 week US  [facility]    Evelyn [name] is a [id number] being seen today for her first obstetrical visit.  Her obstetrical history is significant for term fetal demise. Patient does not intend to breast feed. Pregnancy history fully reviewed.  Patient reports no complaints.  Filed Vitals:   04/11/14 0917  BP: 106/46  Pulse: 72  Temp: 98.1 F (36.7 C)  Weight: 152 lb 4.8 oz (69.083 kg)    HISTORY: OB History  Gravida Para Term Preterm AB SAB TAB Ectopic Multiple Living  5 3 3  1  1   2     # Outcome Date GA Lbr Len/2nd Weight Sex Delivery Anes PTL Lv  5 Current           4 Term 09/30/12 517w1d 14:05 / 00:32 7 lb 15 oz (3.6 kg) F Vag-Spont EPI  Y  3 Term 04/02/11 787w1d 14:09 / 00:49 7 lb 10.4 oz (3.47 kg) F Vag-Spont EPI  Y  2 Term 11/16/08 6146w0d  5 lb 6 oz (2.438 kg)  Vag-Spont EPI N ND     Comments: pt [facility] examination could not find baby's heartbeat during routine prenatal care. States she did not have any problems or drug use. Pt was induced.    1 TAB              Past Medical History  Diagnosis Date  . Eczema   . Anemia   . Sickle cell trait   . Prior pregnancy with fetal demise    Past Surgical History  Procedure Laterality Date  . Dilation and curettage of uterus     Family History  Problem Relation Age of Onset  . Hypertension Father      Exam    Uterus:  Fundal Height: 26 cm  Pelvic Exam:    Perineum: No Hemorrhoids   Vulva: normal   Vagina:  thin grey discharge, wet prep done   pH:    Cervix: no lesions pap done   Adnexa: not evaluated   Bony Pelvis: average  System: Breast:      Skin: normal coloration and turgor, no rashes    Neurologic: oriented, normal mood   Extremities: normal strength, tone, and muscle mass   HEENT trachea midline   Mouth/Teeth dental hygiene good   Neck supple   Cardiovascular: regular rate and rhythm   Respiratory:  appears well, vitals normal, no respiratory distress, acyanotic, normal RR   Abdomen: gravid   Urinary: urethral meatus normal      Assessment:    Pregnancy: [id number] Patient Active Problem List   Diagnosis Date Noted  . [redacted] weeks gestation of pregnancy   . Supervision of high-risk pregnancy 06/19/2012  . Prior pregnancy with fetal demise and current pregnancy 06/19/2012  . Insufficient prenatal care 06/19/2012  . Sickle cell trait         Plan:     Initial labs drawn. Prenatal vitamins. Problem list reviewed and updated. Genetic Screening discussed too late  Ultrasound discussed; fetal survey: results reviewed.  Follow up in 3 weeks. 50% of 30 min visit spent on counseling and coordination of care.  Prenatal testing start 28 weeks. F/u US 2 weeks   ARNOLD,JAMES 04/11/2014

## 2014-04-11 NOTE — Progress Notes (Signed)
Nutrition note: 1st visit consult Pt has gained 27.3# @ 26w, which is > expected. Pt reports eating 5-6x/d. Pt is taking a PNV. Pt reports no N/V or heartburn. Pt reports she craves dirt but has not eaten any during this pregnancy. Pt reports she did eat it in her last pregnancy towards the end. Pt was unaware that she should avoid eating dirt if she can. Pt received verbal & written education on general nutrition during pregnancy. Discussed pica & encouraged pt to avoid eating dirt and to talk to me &/ or her provider if the cravings get worse or she does eat some dirt during this pregnancy. Discussed importance & benefits of BF. Discussed wt gain goals of 25-35# or 1#/wk. Pt agrees to continue taking PNV. Pt does not have WIC but plans to apply. Pt is unsure about BF. F/u as needed Blondell RevealLaura Elery Cadenhead, MS, RD, LDN, [facility]

## 2014-04-11 NOTE — Addendum Note (Signed)
Addended by: Candelaria StagersHAIZLIP, Ladarien Beeks E on: 04/11/2014 03:23 PM   Modules accepted: Orders

## 2014-04-12 LAB — WET PREP, GENITAL
Clue Cells Wet Prep HPF POC: NONE SEEN
Trich, Wet Prep: NONE SEEN
Yeast Wet Prep HPF POC: NONE SEEN

## 2014-04-15 LAB — CYTOLOGY - PAP

## 2014-04-23 ENCOUNTER — Telehealth: Payer: Self-pay | Admitting: General Practice

## 2014-04-23 DIAGNOSIS — A749 Chlamydial infection, unspecified: Secondary | ICD-10-CM

## 2014-04-23 DIAGNOSIS — O98812 Other maternal infectious and parasitic diseases complicating pregnancy, second trimester: Principal | ICD-10-CM

## 2014-04-23 MED ORDER — AZITHROMYCIN 250 MG PO TABS: 1000.0000 mg | ORAL_TABLET | Freq: Once | ORAL | Status: DC

## 2014-04-23 NOTE — Telephone Encounter (Signed)
-----   Message from Adam PhenixJames G Arnold, MD sent at 04/20/2014  3:33 PM EST ----- CT positive needs azithromycin 1 g

## 2014-04-23 NOTE — Telephone Encounter (Signed)
Med sent to pharmacy. STD card filled out. Called patient, no answer- left message stating we are trying to contact you with results, please call us back at the clinics

## 2014-04-24 NOTE — Telephone Encounter (Signed)
Called pt and left message that I am calling with important test result information. Please call back and state whether a detailed message can be left on your voice mail.

## 2014-04-25 ENCOUNTER — Ambulatory Visit (HOSPITAL_COMMUNITY): Payer: Self-pay

## 2014-04-25 ENCOUNTER — Encounter: Payer: Self-pay | Admitting: *Deleted

## 2014-04-25 ENCOUNTER — Encounter: Payer: Self-pay | Admitting: Family Medicine

## 2014-04-25 NOTE — Telephone Encounter (Signed)
Pt did not keep appointment today. Certified Letter mailed to her with results.

## 2014-04-26 ENCOUNTER — Ambulatory Visit (HOSPITAL_COMMUNITY): Admission: RE | Admit: 2014-04-26 | Payer: Self-pay | Source: Ambulatory Visit

## 2014-05-09 ENCOUNTER — Ambulatory Visit (INDEPENDENT_AMBULATORY_CARE_PROVIDER_SITE_OTHER): Payer: Self-pay | Admitting: Obstetrics & Gynecology

## 2014-05-09 VITALS — BP 111/64 | HR 84 | Wt 160.6 lb

## 2014-05-09 DIAGNOSIS — O09293 Supervision of pregnancy with other poor reproductive or obstetric history, third trimester: Secondary | ICD-10-CM

## 2014-05-09 DIAGNOSIS — Z23 Encounter for immunization: Secondary | ICD-10-CM

## 2014-05-09 LAB — HIV ANTIBODY (ROUTINE TESTING W REFLEX): HIV: NONREACTIVE

## 2014-05-09 LAB — POCT URINALYSIS DIP (DEVICE)
Bilirubin Urine: NEGATIVE
Glucose, UA: NEGATIVE mg/dL
Hgb urine dipstick: NEGATIVE
KETONES UR: NEGATIVE mg/dL
Nitrite: NEGATIVE
PH: 7 (ref 5.0–8.0)
Protein, ur: NEGATIVE mg/dL
SPECIFIC GRAVITY, URINE: 1.015 (ref 1.005–1.030)
Urobilinogen, UA: 0.2 mg/dL (ref 0.0–1.0)

## 2014-05-09 LAB — CBC
HCT: 31.9 % — ABNORMAL LOW (ref 36.0–46.0)
Hemoglobin: 10.5 g/dL — ABNORMAL LOW (ref 12.0–15.0)
MCH: 25.3 pg — AB (ref 26.0–34.0)
MCHC: 32.9 g/dL (ref 30.0–36.0)
MCV: 76.9 fL — ABNORMAL LOW (ref 78.0–100.0)
MPV: 9.5 fL (ref 8.6–12.4)
PLATELETS: 287 10*3/uL (ref 150–400)
RBC: 4.15 MIL/uL (ref 3.87–5.11)
RDW: 13.4 % (ref 11.5–15.5)
WBC: 9.6 10*3/uL (ref 4.0–10.5)

## 2014-05-09 MED ORDER — TETANUS-DIPHTH-ACELL PERTUSSIS 5-2.5-18.5 LF-MCG/0.5 IM SUSP
0.5000 mL | Freq: Once | INTRAMUSCULAR | Status: AC
  Administered 2014-05-09: 0.5 mL via INTRAMUSCULAR

## 2014-05-09 NOTE — Progress Notes (Signed)
Growth U/S 05/13/14 @ 145p  with Radiology. Weekly BPP with Radiology 05/10/14 @3p --- 05/16/14 @ 845a--- 05/23/14 @ 845a.

## 2014-05-09 NOTE — Progress Notes (Signed)
Given 28 week pregnancy information. Took zithromax 05/07/14.

## 2014-05-09 NOTE — Progress Notes (Signed)
Start BPP weekly and NST 2/week at 32 weeks

## 2014-05-09 NOTE — Patient Instructions (Signed)
Third Trimester of Pregnancy The third trimester is from week 29 through week 42, months 7 through 9. The third trimester is a time when the fetus is growing rapidly. At the end of the ninth month, the fetus is about 20 inches in length and weighs 6-10 pounds.  BODY CHANGES Your body goes through many changes during pregnancy. The changes vary from woman to woman.   Your weight will continue to increase. You can expect to gain 25-35 pounds (11-16 kg) by the end of the pregnancy.  You may begin to get stretch marks on your hips, abdomen, and breasts.  You may urinate more often because the fetus is moving lower into your pelvis and pressing on your bladder.  You may develop or continue to have heartburn as a result of your pregnancy.  You may develop constipation because certain hormones are causing the muscles that push waste through your intestines to slow down.  You may develop hemorrhoids or swollen, bulging veins (varicose veins).  You may have pelvic pain because of the weight gain and pregnancy hormones relaxing your joints between the bones in your pelvis. Backaches may result from overexertion of the muscles supporting your posture.  You may have changes in your hair. These can include thickening of your hair, rapid growth, and changes in texture. Some women also have hair loss during or after pregnancy, or hair that feels dry or thin. Your hair will most likely return to normal after your baby is born.  Your breasts will continue to grow and be tender. A yellow discharge may leak from your breasts called colostrum.  Your belly button may stick out.  You may feel short of breath because of your expanding uterus.  You may notice the fetus "dropping," or moving lower in your abdomen.  You may have a bloody mucus discharge. This usually occurs a few days to a week before labor begins.  Your cervix becomes thin and soft (effaced) near your due date. WHAT TO EXPECT AT YOUR PRENATAL  EXAMS  You will have prenatal exams every 2 weeks until week 36. Then, you will have weekly prenatal exams. During a routine prenatal visit:  You will be weighed to make sure you and the fetus are growing normally.  Your blood pressure is taken.  Your abdomen will be measured to track your baby's growth.  The fetal heartbeat will be listened to.  Any test results from the previous visit will be discussed.  You may have a cervical check near your due date to see if you have effaced. At around 36 weeks, your caregiver will check your cervix. At the same time, your caregiver will also perform a test on the secretions of the vaginal tissue. This test is to determine if a type of bacteria, Group B streptococcus, is present. Your caregiver will explain this further. Your caregiver may ask you:  What your birth plan is.  How you are feeling.  If you are feeling the baby move.  If you have had any abnormal symptoms, such as leaking fluid, bleeding, severe headaches, or abdominal cramping.  If you have any questions. Other tests or screenings that may be performed during your third trimester include:  Blood tests that check for low iron levels (anemia).  Fetal testing to check the health, activity level, and growth of the fetus. Testing is done if you have certain medical conditions or if there are problems during the pregnancy. FALSE LABOR You may feel small, irregular contractions that   eventually go away. These are called Braxton Hicks contractions, or false labor. Contractions may last for hours, days, or even weeks before true labor sets in. If contractions come at regular intervals, intensify, or become painful, it is best to be seen by your caregiver.  SIGNS OF LABOR   Menstrual-like cramps.  Contractions that are 5 minutes apart or less.  Contractions that start on the top of the uterus and spread down to the lower abdomen and back.  A sense of increased pelvic pressure or back  pain.  A watery or bloody mucus discharge that comes from the vagina. If you have any of these signs before the 37th week of pregnancy, call your caregiver right away. You need to go to the hospital to get checked immediately. HOME CARE INSTRUCTIONS   Avoid all smoking, herbs, alcohol, and unprescribed drugs. These chemicals affect the formation and growth of the baby.  Follow your caregiver's instructions regarding medicine use. There are medicines that are either safe or unsafe to take during pregnancy.  Exercise only as directed by your caregiver. Experiencing uterine cramps is a good sign to stop exercising.  Continue to eat regular, healthy meals.  Wear a good support bra for breast tenderness.  Do not use hot tubs, steam rooms, or saunas.  Wear your seat belt at all times when driving.  Avoid raw meat, uncooked cheese, cat litter boxes, and soil used by cats. These carry germs that can cause birth defects in the baby.  Take your prenatal vitamins.  Try taking a stool softener (if your caregiver approves) if you develop constipation. Eat more high-fiber foods, such as fresh vegetables or fruit and whole grains. Drink plenty of fluids to keep your urine clear or pale yellow.  Take warm sitz baths to soothe any pain or discomfort caused by hemorrhoids. Use hemorrhoid cream if your caregiver approves.  If you develop varicose veins, wear support hose. Elevate your feet for 15 minutes, 3-4 times a day. Limit salt in your diet.  Avoid heavy lifting, wear low heal shoes, and practice good posture.  Rest a lot with your legs elevated if you have leg cramps or low back pain.  Visit your dentist if you have not gone during your pregnancy. Use a soft toothbrush to brush your teeth and be gentle when you floss.  A sexual relationship may be continued unless your caregiver directs you otherwise.  Do not travel far distances unless it is absolutely necessary and only with the approval  of your caregiver.  Take prenatal classes to understand, practice, and ask questions about the labor and delivery.  Make a trial run to the hospital.  Pack your hospital bag.  Prepare the baby's nursery.  Continue to go to all your prenatal visits as directed by your caregiver. SEEK MEDICAL CARE IF:  You are unsure if you are in labor or if your water has broken.  You have dizziness.  You have mild pelvic cramps, pelvic pressure, or nagging pain in your abdominal area.  You have persistent nausea, vomiting, or diarrhea.  You have a bad smelling vaginal discharge.  You have pain with urination. SEEK IMMEDIATE MEDICAL CARE IF:   You have a fever.  You are leaking fluid from your vagina.  You have spotting or bleeding from your vagina.  You have severe abdominal cramping or pain.  You have rapid weight loss or gain.  You have shortness of breath with chest pain.  You notice sudden or extreme swelling   of your face, hands, ankles, feet, or legs.  You have not felt your baby move in over an hour.  You have severe headaches that do not go away with medicine.  You have vision changes. Document Released: 02/16/2001 Document Revised: 02/27/2013 Document Reviewed: 04/25/2012 [employer] Patient Information 2015 [employer]. This information is not intended to replace advice given to you by your health care provider. Make sure you discuss any questions you have with your health care provider.  

## 2014-05-10 ENCOUNTER — Ambulatory Visit (HOSPITAL_COMMUNITY): Admission: RE | Admit: 2014-05-10 | Payer: Self-pay | Source: Ambulatory Visit

## 2014-05-10 LAB — GLUCOSE TOLERANCE, 1 HOUR (50G) W/O FASTING: GLUCOSE 1 HOUR GTT: 115 mg/dL (ref 70–140)

## 2014-05-10 LAB — RPR

## 2014-05-13 ENCOUNTER — Ambulatory Visit (HOSPITAL_COMMUNITY)
Admission: RE | Admit: 2014-05-13 | Discharge: 2014-05-13 | Disposition: A | Discharge status: Home / Self Care | Payer: Medicaid Other | Source: Ambulatory Visit | Attending: Obstetrics & Gynecology | Admitting: Obstetrics & Gynecology

## 2014-05-13 ENCOUNTER — Ambulatory Visit (HOSPITAL_COMMUNITY): Payer: Self-pay

## 2014-05-13 DIAGNOSIS — Z36 Encounter for antenatal screening of mother: Secondary | ICD-10-CM | POA: Insufficient documentation

## 2014-05-13 DIAGNOSIS — O0933 Supervision of pregnancy with insufficient antenatal care, third trimester: Secondary | ICD-10-CM | POA: Diagnosis not present

## 2014-05-13 DIAGNOSIS — O0992 Supervision of high risk pregnancy, unspecified, second trimester: Secondary | ICD-10-CM

## 2014-05-13 DIAGNOSIS — Z3A30 30 weeks gestation of pregnancy: Secondary | ICD-10-CM | POA: Insufficient documentation

## 2014-05-13 DIAGNOSIS — O09293 Supervision of pregnancy with other poor reproductive or obstetric history, third trimester: Secondary | ICD-10-CM | POA: Diagnosis not present

## 2014-05-16 ENCOUNTER — Encounter: Payer: Self-pay | Admitting: Family Medicine

## 2014-05-16 ENCOUNTER — Ambulatory Visit (HOSPITAL_COMMUNITY): Admission: RE | Admit: 2014-05-16 | Payer: Self-pay | Source: Ambulatory Visit

## 2014-05-23 ENCOUNTER — Ambulatory Visit (HOSPITAL_COMMUNITY): Admission: RE | Admit: 2014-05-23 | Payer: Self-pay | Source: Ambulatory Visit

## 2014-06-13 ENCOUNTER — Other Ambulatory Visit: Payer: Self-pay | Admitting: Family Medicine

## 2014-06-13 ENCOUNTER — Ambulatory Visit (HOSPITAL_COMMUNITY)
Admission: RE | Admit: 2014-06-13 | Discharge: 2014-06-13 | Disposition: A | Discharge status: Home / Self Care | Payer: Medicaid Other | Source: Ambulatory Visit | Attending: Obstetrics & Gynecology | Admitting: Obstetrics & Gynecology

## 2014-06-13 ENCOUNTER — Ambulatory Visit (INDEPENDENT_AMBULATORY_CARE_PROVIDER_SITE_OTHER): Payer: Medicaid Other | Admitting: Family Medicine

## 2014-06-13 VITALS — BP 116/59 | HR 83 | Wt 164.5 lb

## 2014-06-13 DIAGNOSIS — Z3A35 35 weeks gestation of pregnancy: Secondary | ICD-10-CM | POA: Diagnosis not present

## 2014-06-13 DIAGNOSIS — Z36 Encounter for antenatal screening of mother: Secondary | ICD-10-CM | POA: Insufficient documentation

## 2014-06-13 DIAGNOSIS — O0933 Supervision of pregnancy with insufficient antenatal care, third trimester: Secondary | ICD-10-CM | POA: Insufficient documentation

## 2014-06-13 DIAGNOSIS — O09293 Supervision of pregnancy with other poor reproductive or obstetric history, third trimester: Secondary | ICD-10-CM

## 2014-06-13 DIAGNOSIS — O0990 Supervision of high risk pregnancy, unspecified, unspecified trimester: Secondary | ICD-10-CM

## 2014-06-13 LAB — POCT URINALYSIS DIP (DEVICE)
BILIRUBIN URINE: NEGATIVE
Glucose, UA: NEGATIVE mg/dL
HGB URINE DIPSTICK: NEGATIVE
Ketones, ur: NEGATIVE mg/dL
Nitrite: NEGATIVE
PH: 6.5 (ref 5.0–8.0)
PROTEIN: 30 mg/dL — AB
Specific Gravity, Urine: 1.02 (ref 1.005–1.030)
Urobilinogen, UA: 0.2 mg/dL (ref 0.0–1.0)

## 2014-06-13 LAB — OB RESULTS CONSOLE GC/CHLAMYDIA
Chlamydia: NEGATIVE
Gonorrhea: NEGATIVE

## 2014-06-13 LAB — OB RESULTS CONSOLE GBS: GBS: POSITIVE

## 2014-06-13 NOTE — Progress Notes (Signed)
Pt kept appt today for BPP - 8/8. Per Dr. Loreta AveAcosta - NST not needed today. Pt is scheduled for twice weekly fetal testing next week.

## 2014-06-13 NOTE — Progress Notes (Signed)
Patient is 24 y.o. [id number].  +FM, denies LOF, VB, contractions, vaginal discharge.  Overall feeling well. - has missed several appointments, reportedly due to no insurance - hx of IUFD: discussed fetal kick counts; schedule IOL at next visit, unable to do so today - TOC G/C and GBS today (done early as pt has not been in clinic for several weeks and may not follow up afterwards) - has BPP today, will need NST at next visit.

## 2014-06-13 NOTE — Progress Notes (Signed)
Ultrasound with Radiology 06/20/14 @ 11a.  Will schedule IOL on RTC.

## 2014-06-13 NOTE — Progress Notes (Signed)
Small leuks noted in urine.  

## 2014-06-14 LAB — GC/CHLAMYDIA PROBE AMP
CT Probe RNA: NEGATIVE
GC PROBE AMP APTIMA: NEGATIVE

## 2014-06-15 LAB — CULTURE, BETA STREP (GROUP B ONLY)

## 2014-06-17 ENCOUNTER — Other Ambulatory Visit: Payer: Medicaid Other

## 2014-06-20 ENCOUNTER — Encounter: Payer: Self-pay | Admitting: Obstetrics & Gynecology

## 2014-06-20 ENCOUNTER — Ambulatory Visit (HOSPITAL_COMMUNITY)
Admission: RE | Admit: 2014-06-20 | Discharge: 2014-06-20 | Disposition: A | Discharge status: Home / Self Care | Payer: Medicaid Other | Source: Ambulatory Visit | Attending: Family Medicine | Admitting: Family Medicine

## 2014-06-20 ENCOUNTER — Ambulatory Visit (INDEPENDENT_AMBULATORY_CARE_PROVIDER_SITE_OTHER): Payer: Medicaid Other | Admitting: Obstetrics & Gynecology

## 2014-06-20 VITALS — BP 120/67 | HR 83 | Wt 167.5 lb

## 2014-06-20 DIAGNOSIS — O0990 Supervision of high risk pregnancy, unspecified, unspecified trimester: Secondary | ICD-10-CM

## 2014-06-20 DIAGNOSIS — O09293 Supervision of pregnancy with other poor reproductive or obstetric history, third trimester: Secondary | ICD-10-CM | POA: Diagnosis not present

## 2014-06-20 DIAGNOSIS — Z8759 Personal history of other complications of pregnancy, childbirth and the puerperium: Secondary | ICD-10-CM | POA: Insufficient documentation

## 2014-06-20 DIAGNOSIS — O0933 Supervision of pregnancy with insufficient antenatal care, third trimester: Secondary | ICD-10-CM | POA: Insufficient documentation

## 2014-06-20 DIAGNOSIS — Z364 Encounter for antenatal screening for fetal growth retardation: Secondary | ICD-10-CM | POA: Insufficient documentation

## 2014-06-20 DIAGNOSIS — Z36 Encounter for antenatal screening of mother: Secondary | ICD-10-CM | POA: Insufficient documentation

## 2014-06-20 DIAGNOSIS — Z3A36 36 weeks gestation of pregnancy: Secondary | ICD-10-CM | POA: Diagnosis not present

## 2014-06-20 LAB — POCT URINALYSIS DIP (DEVICE)
Bilirubin Urine: NEGATIVE
GLUCOSE, UA: NEGATIVE mg/dL
HGB URINE DIPSTICK: NEGATIVE
Ketones, ur: NEGATIVE mg/dL
NITRITE: NEGATIVE
Protein, ur: NEGATIVE mg/dL
Specific Gravity, Urine: 1.015 (ref 1.005–1.030)
UROBILINOGEN UA: 0.2 mg/dL (ref 0.0–1.0)
pH: 7 (ref 5.0–8.0)

## 2014-06-20 NOTE — Progress Notes (Signed)
UA resulted small leukocytes 

## 2014-06-20 NOTE — Progress Notes (Signed)
Needs IOL 39 week scheduled   NST reactive

## 2014-06-20 NOTE — Patient Instructions (Signed)
Labor Induction  Labor induction is when steps are taken to cause a pregnant woman to begin the labor process. Most women go into labor on their own between 37 weeks and 42 weeks of the pregnancy. When this does not happen or when there is a medical need, methods may be used to induce labor. Labor induction causes a pregnant woman's uterus to contract. It also causes the cervix to soften (ripen), open (dilate), and thin out (efface). Usually, labor is not induced before 39 weeks of the pregnancy unless there is a problem with the baby or mother.  Before inducing labor, your health care provider will consider a number of factors, including the following:  The medical condition of you and the baby.   How many weeks along you are.   The status of the baby's lung maturity.   The condition of the cervix.   The position of the baby.  WHAT ARE THE REASONS FOR LABOR INDUCTION? Labor may be induced for the following reasons:  The health of the baby or mother is at risk.   The pregnancy is overdue by 1 week or more.   The water breaks but labor does not start on its own.   The mother has a health condition or serious illness, such as high blood pressure, infection, placental abruption, or diabetes.  The amniotic fluid amounts are low around the baby.   The baby is distressed.  Convenience or wanting the baby to be born on a certain date is not a reason for inducing labor. WHAT METHODS ARE USED FOR LABOR INDUCTION? Several methods of labor induction may be used, such as:   Prostaglandin medicine. This medicine causes the cervix to dilate and ripen. The medicine will also start contractions. It can be taken by mouth or by inserting a suppository into the vagina.   Inserting a thin tube (catheter) with a balloon on the end into the vagina to dilate the cervix. Once inserted, the balloon is expanded with water, which causes the cervix to open.   Stripping the membranes. Your health  care provider separates amniotic sac tissue from the cervix, causing the cervix to be stretched and causing the release of a hormone called progesterone. This may cause the uterus to contract. It is often done during an office visit. You will be sent home to wait for the contractions to begin. You will then come in for an induction.   Breaking the water. Your health care provider makes a hole in the amniotic sac using a small instrument. Once the amniotic sac breaks, contractions should begin. This may still take hours to see an effect.   Medicine to trigger or strengthen contractions. This medicine is given through an IV access tube inserted into a vein in your arm.  All of the methods of induction, besides stripping the membranes, will be done in the hospital. Induction is done in the hospital so that you and the baby can be carefully monitored.  HOW LONG DOES IT TAKE FOR LABOR TO BE INDUCED? Some inductions can take up to 2-3 days. Depending on the cervix, it usually takes less time. It takes longer when you are induced early in the pregnancy or if this is your first pregnancy. If a mother is still pregnant and the induction has been going on for 2-3 days, either the mother will be sent home or a cesarean delivery will be needed. WHAT ARE THE RISKS ASSOCIATED WITH LABOR INDUCTION? Some of the risks of induction   include:   Changes in fetal heart rate, such as too high, too low, or erratic.   Fetal distress.   Chance of infection for the mother and baby.   Increased chance of having a cesarean delivery.   Breaking off (abruption) of the placenta from the uterus (rare).   Uterine rupture (very rare).  When induction is needed for medical reasons, the benefits of induction may outweigh the risks. WHAT ARE SOME REASONS FOR NOT INDUCING LABOR? Labor induction should not be done if:   It is shown that your baby does not tolerate labor.   You have had previous surgeries on your  uterus, such as a myomectomy or the removal of fibroids.   Your placenta lies very low in the uterus and blocks the opening of the cervix (placenta previa).   Your baby is not in a head-down position.   The umbilical cord drops down into the birth canal in front of the baby. This could cut off the baby's blood and oxygen supply.   You have had a previous cesarean delivery.   There are unusual circumstances, such as the baby being extremely premature.  Document Released: 07/14/2006 Document Revised: 10/25/2012 Document Reviewed: 09/21/2012 [employer] Patient Information 2015 [employer]. This information is not intended to replace advice given to you by your health care provider. Make sure you discuss any questions you have with your health care provider.  

## 2014-06-27 ENCOUNTER — Ambulatory Visit (INDEPENDENT_AMBULATORY_CARE_PROVIDER_SITE_OTHER): Payer: Medicaid Other | Admitting: Family Medicine

## 2014-06-27 VITALS — BP 110/64 | HR 73 | Wt 169.4 lb

## 2014-06-27 DIAGNOSIS — O9989 Other specified diseases and conditions complicating pregnancy, childbirth and the puerperium: Secondary | ICD-10-CM

## 2014-06-27 DIAGNOSIS — O0993 Supervision of high risk pregnancy, unspecified, third trimester: Secondary | ICD-10-CM | POA: Diagnosis not present

## 2014-06-27 DIAGNOSIS — Z283 Underimmunization status: Secondary | ICD-10-CM

## 2014-06-27 DIAGNOSIS — O09293 Supervision of pregnancy with other poor reproductive or obstetric history, third trimester: Secondary | ICD-10-CM | POA: Diagnosis not present

## 2014-06-27 DIAGNOSIS — O09899 Supervision of other high risk pregnancies, unspecified trimester: Secondary | ICD-10-CM

## 2014-06-27 DIAGNOSIS — Z8759 Personal history of other complications of pregnancy, childbirth and the puerperium: Secondary | ICD-10-CM

## 2014-06-27 DIAGNOSIS — Z2839 Other underimmunization status: Secondary | ICD-10-CM

## 2014-06-27 DIAGNOSIS — Z22330 Carrier of Group B streptococcus: Secondary | ICD-10-CM

## 2014-06-27 DIAGNOSIS — R809 Proteinuria, unspecified: Secondary | ICD-10-CM

## 2014-06-27 DIAGNOSIS — O99820 Streptococcus B carrier state complicating pregnancy: Secondary | ICD-10-CM

## 2014-06-27 LAB — POCT URINALYSIS DIP (DEVICE)
Bilirubin Urine: NEGATIVE
Glucose, UA: NEGATIVE mg/dL
Hgb urine dipstick: NEGATIVE
Ketones, ur: NEGATIVE mg/dL
Nitrite: NEGATIVE
Protein, ur: 30 mg/dL — AB
SPECIFIC GRAVITY, URINE: 1.015 (ref 1.005–1.030)
UROBILINOGEN UA: 0.2 mg/dL (ref 0.0–1.0)
pH: 7 (ref 5.0–8.0)

## 2014-06-27 NOTE — Progress Notes (Signed)
Moderate leuks on UA.  

## 2014-06-27 NOTE — Progress Notes (Signed)
IOL scheduled for 07/11/14 at 0630AM. Patient instructed to arrive at [facility] at [id number].

## 2014-06-27 NOTE — Progress Notes (Signed)
NST reviewed and reactive. Schedule IOL @ 39 wks U/s for growth last week, showed vtx, 6 lb 14 oz 85%, AFI 19

## 2014-06-27 NOTE — Progress Notes (Signed)
OBF/NST/AFI Pt has been experiencing contractions the past 3 days, feels like the baby is going to come soon, desires cervical exam  Due to Monday's NST schedule/Tuesday closed for NST, pt will have BPP 4/22

## 2014-06-27 NOTE — Patient Instructions (Signed)
Third Trimester of Pregnancy The third trimester is from week 29 through week 42, months 7 through 9. The third trimester is a time when the fetus is growing rapidly. At the end of the ninth month, the fetus is about 20 inches in length and weighs 6-10 pounds.  BODY CHANGES Your body goes through many changes during pregnancy. The changes vary from woman to woman.   Your weight will continue to increase. You can expect to gain 25-35 pounds (11-16 kg) by the end of the pregnancy.  You may begin to get stretch marks on your hips, abdomen, and breasts.  You may urinate more often because the fetus is moving lower into your pelvis and pressing on your bladder.  You may develop or continue to have heartburn as a result of your pregnancy.  You may develop constipation because certain hormones are causing the muscles that push waste through your intestines to slow down.  You may develop hemorrhoids or swollen, bulging veins (varicose veins).  You may have pelvic pain because of the weight gain and pregnancy hormones relaxing your joints between the bones in your pelvis. Backaches may result from overexertion of the muscles supporting your posture.  You may have changes in your hair. These can include thickening of your hair, rapid growth, and changes in texture. Some women also have hair loss during or after pregnancy, or hair that feels dry or thin. Your hair will most likely return to normal after your baby is born.  Your breasts will continue to grow and be tender. A yellow discharge may leak from your breasts called colostrum.  Your belly button may stick out.  You may feel short of breath because of your expanding uterus.  You may notice the fetus "dropping," or moving lower in your abdomen.  You may have a bloody mucus discharge. This usually occurs a few days to a week before labor begins.  Your cervix becomes thin and soft (effaced) near your due date. WHAT TO EXPECT AT YOUR  PRENATAL EXAMS  You will have prenatal exams every 2 weeks until week 36. Then, you will have weekly prenatal exams. During a routine prenatal visit:  You will be weighed to make sure you and the fetus are growing normally.  Your blood pressure is taken.  Your abdomen will be measured to track your baby's growth.  The fetal heartbeat will be listened to.  Any test results from the previous visit will be discussed.  You may have a cervical check near your due date to see if you have effaced. At around 36 weeks, your caregiver will check your cervix. At the same time, your caregiver will also perform a test on the secretions of the vaginal tissue. This test is to determine if a type of bacteria, Group B streptococcus, is present. Your caregiver will explain this further. Your caregiver may ask you:  What your birth plan is.  How you are feeling.  If you are feeling the baby move.  If you have had any abnormal symptoms, such as leaking fluid, bleeding, severe headaches, or abdominal cramping.  If you have any questions. Other tests or screenings that may be performed during your third trimester include:  Blood tests that check for low iron levels (anemia).  Fetal testing to check the health, activity level, and growth of the fetus. Testing is done if you have certain medical conditions or if there are problems during the pregnancy. FALSE LABOR You may feel small, irregular contractions that   eventually go away. These are called Braxton Hicks contractions, or false labor. Contractions may last for hours, days, or even weeks before true labor sets in. If contractions come at regular intervals, intensify, or become painful, it is best to be seen by your caregiver.  SIGNS OF LABOR   Menstrual-like cramps.  Contractions that are 5 minutes apart or less.  Contractions that start on the top of the uterus and spread down to the lower abdomen and back.  A sense of increased pelvic  pressure or back pain.  A watery or bloody mucus discharge that comes from the vagina. If you have any of these signs before the 37th week of pregnancy, call your caregiver right away. You need to go to the hospital to get checked immediately. HOME CARE INSTRUCTIONS   Avoid all smoking, herbs, alcohol, and unprescribed drugs. These chemicals affect the formation and growth of the baby.  Follow your caregiver's instructions regarding medicine use. There are medicines that are either safe or unsafe to take during pregnancy.  Exercise only as directed by your caregiver. Experiencing uterine cramps is a good sign to stop exercising.  Continue to eat regular, healthy meals.  Wear a good support bra for breast tenderness.  Do not use hot tubs, steam rooms, or saunas.  Wear your seat belt at all times when driving.  Avoid raw meat, uncooked cheese, cat litter boxes, and soil used by cats. These carry germs that can cause birth defects in the baby.  Take your prenatal vitamins.  Try taking a stool softener (if your caregiver approves) if you develop constipation. Eat more high-fiber foods, such as fresh vegetables or fruit and whole grains. Drink plenty of fluids to keep your urine clear or pale yellow.  Take warm sitz baths to soothe any pain or discomfort caused by hemorrhoids. Use hemorrhoid cream if your caregiver approves.  If you develop varicose veins, wear support hose. Elevate your feet for 15 minutes, 3-4 times a day. Limit salt in your diet.  Avoid heavy lifting, wear low heal shoes, and practice good posture.  Rest a lot with your legs elevated if you have leg cramps or low back pain.  Visit your dentist if you have not gone during your pregnancy. Use a soft toothbrush to brush your teeth and be gentle when you floss.  A sexual relationship may be continued unless your caregiver directs you otherwise.  Do not travel far distances unless it is absolutely necessary and only  with the approval of your caregiver.  Take prenatal classes to understand, practice, and ask questions about the labor and delivery.  Make a trial run to the hospital.  Pack your hospital bag.  Prepare the baby's nursery.  Continue to go to all your prenatal visits as directed by your caregiver. SEEK MEDICAL CARE IF:  You are unsure if you are in labor or if your water has broken.  You have dizziness.  You have mild pelvic cramps, pelvic pressure, or nagging pain in your abdominal area.  You have persistent nausea, vomiting, or diarrhea.  You have a bad smelling vaginal discharge.  You have pain with urination. SEEK IMMEDIATE MEDICAL CARE IF:   You have a fever.  You are leaking fluid from your vagina.  You have spotting or bleeding from your vagina.  You have severe abdominal cramping or pain.  You have rapid weight loss or gain.  You have shortness of breath with chest pain.  You notice sudden or extreme swelling   of your face, hands, ankles, feet, or legs.  You have not felt your baby move in over an hour.  You have severe headaches that do not go away with medicine.  You have vision changes. Document Released: 02/16/2001 Document Revised: 02/27/2013 Document Reviewed: 04/25/2012 [employer] Patient Information 2015 [employer]. This information is not intended to replace advice given to you by your health care provider. Make sure you discuss any questions you have with your health care provider.  Breastfeeding Deciding to breastfeed is one of the best choices you can make for you and your baby. A change in hormones during pregnancy causes your breast tissue to grow and increases the number and size of your milk ducts. These hormones also allow proteins, sugars, and fats from your blood supply to make breast milk in your milk-producing glands. Hormones prevent breast milk from being released before your baby is born as well as prompt milk flow after birth. Once  breastfeeding has begun, thoughts of your baby, as well as his or her sucking or crying, can stimulate the release of milk from your milk-producing glands.  BENEFITS OF BREASTFEEDING For Your Baby  Your first milk (colostrum) helps your baby's digestive system function better.   There are antibodies in your milk that help your baby fight off infections.   Your baby has a lower incidence of asthma, allergies, and sudden infant death syndrome.   The nutrients in breast milk are better for your baby than infant formulas and are designed uniquely for your baby's needs.   Breast milk improves your baby's brain development.   Your baby is less likely to develop other conditions, such as childhood obesity, asthma, or type 2 diabetes mellitus.  For You   Breastfeeding helps to create a very special bond between you and your baby.   Breastfeeding is convenient. Breast milk is always available at the correct temperature and costs nothing.   Breastfeeding helps to burn calories and helps you lose the weight gained during pregnancy.   Breastfeeding makes your uterus contract to its prepregnancy size faster and slows bleeding (lochia) after you give birth.   Breastfeeding helps to lower your risk of developing type 2 diabetes mellitus, osteoporosis, and breast or ovarian cancer later in life. SIGNS THAT YOUR BABY IS HUNGRY Early Signs of Hunger  Increased alertness or activity.  Stretching.  Movement of the head from side to side.  Movement of the head and opening of the mouth when the corner of the mouth or cheek is stroked (rooting).  Increased sucking sounds, smacking lips, cooing, sighing, or squeaking.  Hand-to-mouth movements.  Increased sucking of fingers or hands. Late Signs of Hunger  Fussing.  Intermittent crying. Extreme Signs of Hunger Signs of extreme hunger will require calming and consoling before your baby will be able to breastfeed successfully. Do not  wait for the following signs of extreme hunger to occur before you initiate breastfeeding:   Restlessness.  A loud, strong cry.   Screaming. BREASTFEEDING BASICS Breastfeeding Initiation  Find a comfortable place to sit or lie down, with your neck and back well supported.  Place a pillow or rolled up blanket under your baby to bring him or her to the level of your breast (if you are seated). Nursing pillows are specially designed to help support your arms and your baby while you breastfeed.  Make sure that your baby's abdomen is facing your abdomen.   Gently massage your breast. With your fingertips, massage from your chest   wall toward your nipple in a circular motion. This encourages milk flow. You may need to continue this action during the feeding if your milk flows slowly.  Support your breast with 4 fingers underneath and your thumb above your nipple. Make sure your fingers are well away from your nipple and your baby's mouth.   Stroke your baby's lips gently with your finger or nipple.   When your baby's mouth is open wide enough, quickly bring your baby to your breast, placing your entire nipple and as much of the colored area around your nipple (areola) as possible into your baby's mouth.   More areola should be visible above your baby's upper lip than below the lower lip.   Your baby's tongue should be between his or her lower gum and your breast.   Ensure that your baby's mouth is correctly positioned around your nipple (latched). Your baby's lips should create a seal on your breast and be turned out (everted).  It is common for your baby to suck about 2-3 minutes in order to start the flow of breast milk. Latching Teaching your baby how to latch on to your breast properly is very important. An improper latch can cause nipple pain and decreased milk supply for you and poor weight gain in your baby. Also, if your baby is not latched onto your nipple properly, he or she  may swallow some air during feeding. This can make your baby fussy. Burping your baby when you switch breasts during the feeding can help to get rid of the air. However, teaching your baby to latch on properly is still the best way to prevent fussiness from swallowing air while breastfeeding. Signs that your baby has successfully latched on to your nipple:    Silent tugging or silent sucking, without causing you pain.   Swallowing heard between every 3-4 sucks.    Muscle movement above and in front of his or her ears while sucking.  Signs that your baby has not successfully latched on to nipple:   Sucking sounds or smacking sounds from your baby while breastfeeding.  Nipple pain. If you think your baby has not latched on correctly, slip your finger into the corner of your baby's mouth to break the suction and place it between your baby's gums. Attempt breastfeeding initiation again. Signs of Successful Breastfeeding Signs from your baby:   A gradual decrease in the number of sucks or complete cessation of sucking.   Falling asleep.   Relaxation of his or her body.   Retention of a small amount of milk in his or her mouth.   Letting go of your breast by himself or herself. Signs from you:  Breasts that have increased in firmness, weight, and size 1-3 hours after feeding.   Breasts that are softer immediately after breastfeeding.  Increased milk volume, as well as a change in milk consistency and color by the fifth day of breastfeeding.   Nipples that are not sore, cracked, or bleeding. Signs That Your Baby is Getting Enough Milk  Wetting at least 3 diapers in a 24-hour period. The urine should be clear and pale yellow by age 5 days.  At least 3 stools in a 24-hour period by age 5 days. The stool should be soft and yellow.  At least 3 stools in a 24-hour period by age 7 days. The stool should be seedy and yellow.  No loss of weight greater than 10% of birth weight  during the first 3   days of age.  Average weight gain of 4-7 ounces (113-198 g) per week after age 4 days.  Consistent daily weight gain by age 5 days, without weight loss after the age of 2 weeks. After a feeding, your baby may spit up a small amount. This is common. BREASTFEEDING FREQUENCY AND DURATION Frequent feeding will help you make more milk and can prevent sore nipples and breast engorgement. Breastfeed when you feel the need to reduce the fullness of your breasts or when your baby shows signs of hunger. This is called "breastfeeding on demand." Avoid introducing a pacifier to your baby while you are working to establish breastfeeding (the first 4-6 weeks after your baby is born). After this time you may choose to use a pacifier. Research has shown that pacifier use during the first year of a baby's life decreases the risk of sudden infant death syndrome (SIDS). Allow your baby to feed on each breast as long as he or she wants. Breastfeed until your baby is finished feeding. When your baby unlatches or falls asleep while feeding from the first breast, offer the second breast. Because newborns are often sleepy in the first few weeks of life, you may need to awaken your baby to get him or her to feed. Breastfeeding times will vary from baby to baby. However, the following rules can serve as a guide to help you ensure that your baby is properly fed:  Newborns (babies 4 weeks of age or younger) may breastfeed every 1-3 hours.  Newborns should not go longer than 3 hours during the day or 5 hours during the night without breastfeeding.  You should breastfeed your baby a minimum of 8 times in a 24-hour period until you begin to introduce solid foods to your baby at around 6 months of age. BREAST MILK PUMPING Pumping and storing breast milk allows you to ensure that your baby is exclusively fed your breast milk, even at times when you are unable to breastfeed. This is especially important if you are  going back to work while you are still breastfeeding or when you are not able to be present during feedings. Your lactation consultant can give you guidelines on how long it is safe to store breast milk.  A breast pump is a machine that allows you to pump milk from your breast into a sterile bottle. The pumped breast milk can then be stored in a refrigerator or freezer. Some breast pumps are operated by hand, while others use electricity. Ask your lactation consultant which type will work best for you. Breast pumps can be purchased, but some hospitals and breastfeeding support groups lease breast pumps on a monthly basis. A lactation consultant can teach you how to hand express breast milk, if you prefer not to use a pump.  CARING FOR YOUR BREASTS WHILE YOU BREASTFEED Nipples can become dry, cracked, and sore while breastfeeding. The following recommendations can help keep your breasts moisturized and healthy:  Avoid using soap on your nipples.   Wear a supportive bra. Although not required, special nursing bras and tank tops are designed to allow access to your breasts for breastfeeding without taking off your entire bra or top. Avoid wearing underwire-style bras or extremely tight bras.  Air dry your nipples for 3-4minutes after each feeding.   Use only cotton bra pads to absorb leaked breast milk. Leaking of breast milk between feedings is normal.   Use lanolin on your nipples after breastfeeding. Lanolin helps to maintain your skin's   normal moisture barrier. If you use pure lanolin, you do not need to wash it off before feeding your baby again. Pure lanolin is not toxic to your baby. You may also hand express a few drops of breast milk and gently massage that milk into your nipples and allow the milk to air dry. In the first few weeks after giving birth, some women experience extremely full breasts (engorgement). Engorgement can make your breasts feel heavy, warm, and tender to the touch.  Engorgement peaks within 3-5 days after you give birth. The following recommendations can help ease engorgement:  Completely empty your breasts while breastfeeding or pumping. You may want to start by applying warm, moist heat (in the shower or with warm water-soaked hand towels) just before feeding or pumping. This increases circulation and helps the milk flow. If your baby does not completely empty your breasts while breastfeeding, pump any extra milk after he or she is finished.  Wear a snug bra (nursing or regular) or tank top for 1-2 days to signal your body to slightly decrease milk production.  Apply ice packs to your breasts, unless this is too uncomfortable for you.  Make sure that your baby is latched on and positioned properly while breastfeeding. If engorgement persists after 48 hours of following these recommendations, contact your health care provider or a lactation consultant. OVERALL HEALTH CARE RECOMMENDATIONS WHILE BREASTFEEDING  Eat healthy foods. Alternate between meals and snacks, eating 3 of each per day. Because what you eat affects your breast milk, some of the foods may make your baby more irritable than usual. Avoid eating these foods if you are sure that they are negatively affecting your baby.  Drink milk, fruit juice, and water to satisfy your thirst (about 10 glasses a day).   Rest often, relax, and continue to take your prenatal vitamins to prevent fatigue, stress, and anemia.  Continue breast self-awareness checks.  Avoid chewing and smoking tobacco.  Avoid alcohol and drug use. Some medicines that may be harmful to your baby can pass through breast milk. It is important to ask your health care provider before taking any medicine, including all over-the-counter and prescription medicine as well as vitamin and herbal supplements. It is possible to become pregnant while breastfeeding. If birth control is desired, ask your health care provider about options that  will be safe for your baby. SEEK MEDICAL CARE IF:   You feel like you want to stop breastfeeding or have become frustrated with breastfeeding.  You have painful breasts or nipples.  Your nipples are cracked or bleeding.  Your breasts are red, tender, or warm.  You have a swollen area on either breast.  You have a fever or chills.  You have nausea or vomiting.  You have drainage other than breast milk from your nipples.  Your breasts do not become full before feedings by the fifth day after you give birth.  You feel sad and depressed.  Your baby is too sleepy to eat well.  Your baby is having trouble sleeping.   Your baby is wetting less than 3 diapers in a 24-hour period.  Your baby has less than 3 stools in a 24-hour period.  Your baby's skin or the white part of his or her eyes becomes yellow.   Your baby is not gaining weight by 5 days of age. SEEK IMMEDIATE MEDICAL CARE IF:   Your baby is overly tired (lethargic) and does not want to wake up and feed.  Your baby   develops an unexplained fever. Document Released: 02/22/2005 Document Revised: 02/27/2013 Document Reviewed: 08/16/2012 [employer] Patient Information 2015 [employer]. This information is not intended to replace advice given to you by your health care provider. Make sure you discuss any questions you have with your health care provider.  

## 2014-06-28 ENCOUNTER — Ambulatory Visit (HOSPITAL_COMMUNITY): Admission: RE | Admit: 2014-06-28 | Payer: Medicaid Other | Source: Ambulatory Visit

## 2014-06-28 LAB — CULTURE, OB URINE: Colony Count: 2000

## 2014-07-02 ENCOUNTER — Encounter (HOSPITAL_COMMUNITY): Payer: Self-pay | Admitting: *Deleted

## 2014-07-02 ENCOUNTER — Telehealth (HOSPITAL_COMMUNITY): Payer: Self-pay | Admitting: *Deleted

## 2014-07-02 NOTE — Telephone Encounter (Signed)
Preadmission screen  

## 2014-07-04 ENCOUNTER — Ambulatory Visit (INDEPENDENT_AMBULATORY_CARE_PROVIDER_SITE_OTHER): Payer: Medicaid Other | Admitting: Family Medicine

## 2014-07-04 VITALS — BP 105/65 | HR 89 | Wt 171.3 lb

## 2014-07-04 DIAGNOSIS — O09293 Supervision of pregnancy with other poor reproductive or obstetric history, third trimester: Secondary | ICD-10-CM

## 2014-07-04 LAB — POCT URINALYSIS DIP (DEVICE)
Bilirubin Urine: NEGATIVE
GLUCOSE, UA: NEGATIVE mg/dL
Hgb urine dipstick: NEGATIVE
KETONES UR: NEGATIVE mg/dL
Nitrite: NEGATIVE
Protein, ur: NEGATIVE mg/dL
SPECIFIC GRAVITY, URINE: 1.015 (ref 1.005–1.030)
Urobilinogen, UA: 0.2 mg/dL (ref 0.0–1.0)
pH: 6.5 (ref 5.0–8.0)

## 2014-07-04 NOTE — Progress Notes (Signed)
Patient is 24 y.o. [id number].  +FM, denies LOF, VB, vaginal discharge.  Overall feeling well. #hx of IUFD => IOL already scheduled NST reactive

## 2014-07-04 NOTE — Progress Notes (Signed)
Small leuks on Udip.  IOL scheduled 07/11/14 @ 0630

## 2014-07-08 ENCOUNTER — Ambulatory Visit (INDEPENDENT_AMBULATORY_CARE_PROVIDER_SITE_OTHER): Payer: Medicaid Other | Admitting: *Deleted

## 2014-07-08 ENCOUNTER — Ambulatory Visit (HOSPITAL_COMMUNITY)
Admission: AD | Admit: 2014-07-08 | Discharge: 2014-07-08 | Disposition: A | Discharge status: Home / Self Care | Payer: Medicaid Other | Source: Ambulatory Visit | Attending: Obstetrics & Gynecology | Admitting: Obstetrics & Gynecology

## 2014-07-08 ENCOUNTER — Ambulatory Visit (HOSPITAL_COMMUNITY)
Admission: RE | Admit: 2014-07-08 | Discharge: 2014-07-08 | Disposition: A | Discharge status: Home / Self Care | Payer: Medicaid Other | Source: Ambulatory Visit | Attending: Obstetrics & Gynecology | Admitting: Obstetrics & Gynecology

## 2014-07-08 DIAGNOSIS — O0933 Supervision of pregnancy with insufficient antenatal care, third trimester: Secondary | ICD-10-CM | POA: Insufficient documentation

## 2014-07-08 DIAGNOSIS — O09293 Supervision of pregnancy with other poor reproductive or obstetric history, third trimester: Secondary | ICD-10-CM

## 2014-07-08 DIAGNOSIS — Z3A38 38 weeks gestation of pregnancy: Secondary | ICD-10-CM | POA: Insufficient documentation

## 2014-07-08 DIAGNOSIS — Z36 Encounter for antenatal screening of mother: Secondary | ICD-10-CM | POA: Diagnosis present

## 2014-07-11 ENCOUNTER — Encounter (HOSPITAL_COMMUNITY): Payer: Self-pay

## 2014-07-11 ENCOUNTER — Inpatient Hospital Stay (HOSPITAL_COMMUNITY): Payer: Medicaid Other | Admitting: Anesthesiology

## 2014-07-11 ENCOUNTER — Inpatient Hospital Stay (HOSPITAL_COMMUNITY)
Admission: RE | Admit: 2014-07-11 | Discharge: 2014-07-13 | DRG: 775 | Disposition: A | Discharge status: Home / Self Care | Payer: Medicaid Other | Source: Ambulatory Visit | Attending: Obstetrics & Gynecology | Admitting: Obstetrics & Gynecology

## 2014-07-11 VITALS — BP 119/64 | HR 74 | Temp 98.0°F | Resp 20 | Ht 67.0 in | Wt 171.0 lb

## 2014-07-11 DIAGNOSIS — F17210 Nicotine dependence, cigarettes, uncomplicated: Secondary | ICD-10-CM | POA: Diagnosis present

## 2014-07-11 DIAGNOSIS — O9972 Diseases of the skin and subcutaneous tissue complicating childbirth: Secondary | ICD-10-CM | POA: Diagnosis present

## 2014-07-11 DIAGNOSIS — O09293 Supervision of pregnancy with other poor reproductive or obstetric history, third trimester: Secondary | ICD-10-CM | POA: Diagnosis present

## 2014-07-11 DIAGNOSIS — O99334 Smoking (tobacco) complicating childbirth: Secondary | ICD-10-CM | POA: Diagnosis present

## 2014-07-11 DIAGNOSIS — O0993 Supervision of high risk pregnancy, unspecified, third trimester: Secondary | ICD-10-CM

## 2014-07-11 DIAGNOSIS — L309 Dermatitis, unspecified: Secondary | ICD-10-CM | POA: Diagnosis present

## 2014-07-11 DIAGNOSIS — O99824 Streptococcus B carrier state complicating childbirth: Secondary | ICD-10-CM | POA: Diagnosis present

## 2014-07-11 DIAGNOSIS — O99820 Streptococcus B carrier state complicating pregnancy: Secondary | ICD-10-CM

## 2014-07-11 DIAGNOSIS — Z283 Underimmunization status: Secondary | ICD-10-CM

## 2014-07-11 DIAGNOSIS — Z3A39 39 weeks gestation of pregnancy: Secondary | ICD-10-CM | POA: Diagnosis present

## 2014-07-11 DIAGNOSIS — D573 Sickle-cell trait: Secondary | ICD-10-CM | POA: Diagnosis present

## 2014-07-11 DIAGNOSIS — D649 Anemia, unspecified: Secondary | ICD-10-CM | POA: Diagnosis present

## 2014-07-11 DIAGNOSIS — O09899 Supervision of other high risk pregnancies, unspecified trimester: Secondary | ICD-10-CM

## 2014-07-11 DIAGNOSIS — O09299 Supervision of pregnancy with other poor reproductive or obstetric history, unspecified trimester: Secondary | ICD-10-CM

## 2014-07-11 DIAGNOSIS — O9902 Anemia complicating childbirth: Secondary | ICD-10-CM | POA: Diagnosis present

## 2014-07-11 DIAGNOSIS — O9989 Other specified diseases and conditions complicating pregnancy, childbirth and the puerperium: Secondary | ICD-10-CM

## 2014-07-11 LAB — CBC
HCT: 29.8 % — ABNORMAL LOW (ref 36.0–46.0)
HEMOGLOBIN: 10 g/dL — AB (ref 12.0–15.0)
MCH: 24.2 pg — ABNORMAL LOW (ref 26.0–34.0)
MCHC: 33.6 g/dL (ref 30.0–36.0)
MCV: 72 fL — AB (ref 78.0–100.0)
Platelets: 269 10*3/uL (ref 150–400)
RBC: 4.14 MIL/uL (ref 3.87–5.11)
RDW: 13.1 % (ref 11.5–15.5)
WBC: 7.6 10*3/uL (ref 4.0–10.5)

## 2014-07-11 LAB — TYPE AND SCREEN
ABO/RH(D): B POS
ANTIBODY SCREEN: NEGATIVE

## 2014-07-11 LAB — HIV ANTIBODY (ROUTINE TESTING W REFLEX): HIV Screen 4th Generation wRfx: NONREACTIVE

## 2014-07-11 LAB — RPR: RPR: NONREACTIVE

## 2014-07-11 LAB — ABO/RH: ABO/RH(D): B POS

## 2014-07-11 MED ORDER — DIBUCAINE 1 % RE OINT: 1.0000 "application " | TOPICAL_OINTMENT | RECTAL | Status: DC | PRN

## 2014-07-11 MED ORDER — OXYCODONE-ACETAMINOPHEN 5-325 MG PO TABS: 2.0000 | ORAL_TABLET | ORAL | Status: DC | PRN

## 2014-07-11 MED ORDER — FENTANYL 2.5 MCG/ML BUPIVACAINE 1/10 % EPIDURAL INFUSION (WH - ANES)
INTRAMUSCULAR | Status: AC
  Filled 2014-07-11: qty 125

## 2014-07-11 MED ORDER — BENZOCAINE-MENTHOL 20-0.5 % EX AERO: 1.0000 "application " | INHALATION_SPRAY | CUTANEOUS | Status: DC | PRN

## 2014-07-11 MED ORDER — FENTANYL 2.5 MCG/ML BUPIVACAINE 1/10 % EPIDURAL INFUSION (WH - ANES)
14.0000 mL/h | INTRAMUSCULAR | Status: DC | PRN
  Administered 2014-07-11: 14 mL/h via EPIDURAL

## 2014-07-11 MED ORDER — PENICILLIN G POTASSIUM 5000000 UNITS IJ SOLR
2.5000 10*6.[IU] | INTRAVENOUS | Status: DC
  Administered 2014-07-11 (×3): 2.5 10*6.[IU] via INTRAVENOUS
  Filled 2014-07-11 (×5): qty 2.5

## 2014-07-11 MED ORDER — LANOLIN HYDROUS EX OINT
TOPICAL_OINTMENT | CUTANEOUS | Status: DC | PRN
Start: 2014-07-11 — End: 2014-07-13

## 2014-07-11 MED ORDER — ONDANSETRON HCL 4 MG/2ML IJ SOLN
4.0000 mg | INTRAMUSCULAR | Status: DC | PRN
Start: 2014-07-11 — End: 2014-07-13

## 2014-07-11 MED ORDER — DEXTROSE 5 % IV SOLN
5.0000 10*6.[IU] | Freq: Once | INTRAVENOUS | Status: AC
  Administered 2014-07-11: 5 10*6.[IU] via INTRAVENOUS
  Filled 2014-07-11: qty 5

## 2014-07-11 MED ORDER — DIPHENHYDRAMINE HCL 25 MG PO CAPS
25.0000 mg | ORAL_CAPSULE | Freq: Four times a day (QID) | ORAL | Status: DC | PRN
  Administered 2014-07-11: 25 mg via ORAL
  Filled 2014-07-11: qty 1

## 2014-07-11 MED ORDER — OXYTOCIN 40 UNITS IN LACTATED RINGERS INFUSION - SIMPLE MED
62.5000 mL/h | INTRAVENOUS | Status: DC
  Filled 2014-07-11: qty 1000

## 2014-07-11 MED ORDER — FERROUS SULFATE 325 (65 FE) MG PO TABS
325.0000 mg | ORAL_TABLET | Freq: Two times a day (BID) | ORAL | Status: DC
  Administered 2014-07-12 – 2014-07-13 (×3): 325 mg via ORAL
  Filled 2014-07-11 (×3): qty 1

## 2014-07-11 MED ORDER — LIDOCAINE HCL (PF) 1 % IJ SOLN
INTRAMUSCULAR | Status: DC | PRN
  Administered 2014-07-11: 10 mL

## 2014-07-11 MED ORDER — OXYTOCIN BOLUS FROM INFUSION: 500.0000 mL | INTRAVENOUS | Status: DC

## 2014-07-11 MED ORDER — PHENYLEPHRINE 40 MCG/ML (10ML) SYRINGE FOR IV PUSH (FOR BLOOD PRESSURE SUPPORT)
80.0000 ug | PREFILLED_SYRINGE | INTRAVENOUS | Status: DC | PRN
  Filled 2014-07-11: qty 2

## 2014-07-11 MED ORDER — PHENYLEPHRINE 40 MCG/ML (10ML) SYRINGE FOR IV PUSH (FOR BLOOD PRESSURE SUPPORT)
PREFILLED_SYRINGE | INTRAVENOUS | Status: AC
  Filled 2014-07-11: qty 20

## 2014-07-11 MED ORDER — EPHEDRINE 5 MG/ML INJ
10.0000 mg | INTRAVENOUS | Status: DC | PRN
  Filled 2014-07-11: qty 2

## 2014-07-11 MED ORDER — OXYTOCIN 40 UNITS IN LACTATED RINGERS INFUSION - SIMPLE MED: 62.5000 mL/h | INTRAVENOUS | Status: DC | PRN

## 2014-07-11 MED ORDER — OXYCODONE-ACETAMINOPHEN 5-325 MG PO TABS
1.0000 | ORAL_TABLET | ORAL | Status: DC | PRN
Start: 2014-07-11 — End: 2014-07-11

## 2014-07-11 MED ORDER — LIDOCAINE HCL (PF) 1 % IJ SOLN
30.0000 mL | INTRAMUSCULAR | Status: DC | PRN
  Filled 2014-07-11: qty 30

## 2014-07-11 MED ORDER — LACTATED RINGERS IV SOLN: 500.0000 mL | INTRAVENOUS | Status: DC | PRN

## 2014-07-11 MED ORDER — ONDANSETRON HCL 4 MG/2ML IJ SOLN: 4.0000 mg | Freq: Four times a day (QID) | INTRAMUSCULAR | Status: DC | PRN

## 2014-07-11 MED ORDER — ACETAMINOPHEN 325 MG PO TABS: 650.0000 mg | ORAL_TABLET | ORAL | Status: DC | PRN

## 2014-07-11 MED ORDER — DIPHENHYDRAMINE HCL 50 MG/ML IJ SOLN: 12.5000 mg | INTRAMUSCULAR | Status: DC | PRN

## 2014-07-11 MED ORDER — PRENATAL MULTIVITAMIN CH
1.0000 | ORAL_TABLET | Freq: Every day | ORAL | Status: DC
  Administered 2014-07-12 – 2014-07-13 (×2): 1 via ORAL
  Filled 2014-07-11 (×2): qty 1

## 2014-07-11 MED ORDER — CITRIC ACID-SODIUM CITRATE 334-500 MG/5ML PO SOLN
30.0000 mL | ORAL | Status: DC | PRN
  Administered 2014-07-11: 30 mL via ORAL
  Filled 2014-07-11: qty 15

## 2014-07-11 MED ORDER — ZOLPIDEM TARTRATE 5 MG PO TABS: 5.0000 mg | ORAL_TABLET | Freq: Every evening | ORAL | Status: DC | PRN

## 2014-07-11 MED ORDER — TETANUS-DIPHTH-ACELL PERTUSSIS 5-2.5-18.5 LF-MCG/0.5 IM SUSP: 0.5000 mL | Freq: Once | INTRAMUSCULAR | Status: DC

## 2014-07-11 MED ORDER — SENNOSIDES-DOCUSATE SODIUM 8.6-50 MG PO TABS
2.0000 | ORAL_TABLET | ORAL | Status: DC
  Administered 2014-07-11 – 2014-07-13 (×2): 2 via ORAL
  Filled 2014-07-11 (×2): qty 2

## 2014-07-11 MED ORDER — TERBUTALINE SULFATE 1 MG/ML IJ SOLN
0.2500 mg | Freq: Once | INTRAMUSCULAR | Status: DC | PRN
  Filled 2014-07-11: qty 1

## 2014-07-11 MED ORDER — FLEET ENEMA 7-19 GM/118ML RE ENEM: 1.0000 | ENEMA | Freq: Every day | RECTAL | Status: DC | PRN

## 2014-07-11 MED ORDER — METHYLERGONOVINE MALEATE 0.2 MG/ML IJ SOLN: 0.2000 mg | INTRAMUSCULAR | Status: DC | PRN

## 2014-07-11 MED ORDER — OXYTOCIN 40 UNITS IN LACTATED RINGERS INFUSION - SIMPLE MED
1.0000 m[IU]/min | INTRAVENOUS | Status: DC
  Administered 2014-07-11: 8 m[IU]/min via INTRAVENOUS
  Administered 2014-07-11: 2 m[IU]/min via INTRAVENOUS

## 2014-07-11 MED ORDER — LACTATED RINGERS IV SOLN
INTRAVENOUS | Status: DC
  Administered 2014-07-11 (×2): via INTRAVENOUS

## 2014-07-11 MED ORDER — FENTANYL 2.5 MCG/ML BUPIVACAINE 1/10 % EPIDURAL INFUSION (WH - ANES): 14.0000 mL/h | INTRAMUSCULAR | Status: DC | PRN

## 2014-07-11 MED ORDER — WITCH HAZEL-GLYCERIN EX PADS: 1.0000 "application " | MEDICATED_PAD | CUTANEOUS | Status: DC | PRN

## 2014-07-11 MED ORDER — IBUPROFEN 600 MG PO TABS
600.0000 mg | ORAL_TABLET | Freq: Four times a day (QID) | ORAL | Status: DC
  Administered 2014-07-11 – 2014-07-13 (×7): 600 mg via ORAL
  Filled 2014-07-11 (×7): qty 1

## 2014-07-11 MED ORDER — METHYLERGONOVINE MALEATE 0.2 MG PO TABS: 0.2000 mg | ORAL_TABLET | ORAL | Status: DC | PRN

## 2014-07-11 MED ORDER — SIMETHICONE 80 MG PO CHEW: 80.0000 mg | CHEWABLE_TABLET | ORAL | Status: DC | PRN

## 2014-07-11 MED ORDER — MEASLES, MUMPS & RUBELLA VAC ~~LOC~~ INJ
0.5000 mL | INJECTION | Freq: Once | SUBCUTANEOUS | Status: AC
  Administered 2014-07-13: 0.5 mL via SUBCUTANEOUS
  Filled 2014-07-11 (×2): qty 0.5

## 2014-07-11 MED ORDER — BISACODYL 10 MG RE SUPP: 10.0000 mg | Freq: Every day | RECTAL | Status: DC | PRN

## 2014-07-11 MED ORDER — OXYCODONE-ACETAMINOPHEN 5-325 MG PO TABS
1.0000 | ORAL_TABLET | ORAL | Status: DC | PRN
  Administered 2014-07-12 – 2014-07-13 (×3): 1 via ORAL
  Filled 2014-07-11 (×3): qty 1

## 2014-07-11 MED ORDER — ONDANSETRON HCL 4 MG PO TABS: 4.0000 mg | ORAL_TABLET | ORAL | Status: DC | PRN

## 2014-07-11 NOTE — Progress Notes (Signed)
Evelyn [name] is a 24 y.o. [id number] at 265w0d admitted for induction of labor due to h/o stillbirth.  Subjective: Patient is feeling contractions but pain is tolerable.  Objective: BP 99/57 mmHg  Pulse 73  Temp(Src) 98.2 F (36.8 C) (Oral)  Resp 18  Ht 5\' 7"  (1.702 m)  Wt 171 lb (77.565 kg)  BMI 26.78 kg/m2  LMP 10/14/2013 (Approximate)     FHT:  FHR: 125 bpm, variability: moderate,  accelerations:  Present,  decelerations:  Absent UC:    every 2-4 minutes SVE:   Dilation: 3.5-4 Effacement (%): 60-70 Station: -1 to 0 Exam by:: [facility]  Labs: Lab Results  Component Value Date   WBC 7.6 07/11/2014   HGB 10.0* 07/11/2014   HCT 29.8* 07/11/2014   MCV 72.0* 07/11/2014   PLT 269 07/11/2014   Assessment / Plan: Induction of labor due to h/o stillbirth,  progressing well on pitocin  Labor: Progressing on Pitocin, will continue to increase then AROM Fetal Wellbeing:  Category I Pain Control:  Epidural upon maternal request I/D:  GBS+, PCN Anticipated MOD:  NSVD  Raliegh IpGottschalk, Ashly M, DO 07/11/2014, 12:39 PM

## 2014-07-11 NOTE — Progress Notes (Addendum)
Evelyn [name] is a 24 y.o. [id number] at 3447w0d admitted [facility] induction of labor due to h/o stillbirth.  Subjective: Patient is feeling contractions but pain is tolerable.  Objective: BP 117/74 mmHg  Pulse 75  Temp(Src) 98.1 F (36.7 C) (Oral)  Resp 20  Ht 5\' 7"  (1.702 [name])  Wt 171 lb (77.565 kg)  BMI 26.78 kg/m2  LMP 10/14/2013 (Approximate)     FHT:  FHR: 130 bpm, variability: moderate,  accelerations:  Present,  decelerations:  Absent UC:    every 3-4 minutes SVE:   Dilation: 4.5 Effacement (%): 50 Cervical Position: posterior Station: -2 Presentation: Vertex Exam by:: Evelyn [name], Evelyn [name] CNM  Labs: Lab Results  Component Value Date   WBC 7.6 07/11/2014   HGB 10.0* 07/11/2014   HCT 29.8* 07/11/2014   MCV 72.0* 07/11/2014   PLT 269 07/11/2014   Assessment / Plan: Induction of labor due to h/o stillbirth,  progressing well on pitocin  Labor: Progressing on Pitocin.  AROM at 1630 Fetal Wellbeing:  Category I Pain Control:  Epidural upon maternal request I/D:  GBS+, PCN Anticipated MOD:  NSVD  Evelyn [name], Evelyn [name], Evelyn [name] 07/11/2014, 4:41 PM

## 2014-07-11 NOTE — Anesthesia Preprocedure Evaluation (Signed)
Anesthesia Evaluation  Patient identified by MRN, date of birth, ID band Patient awake and Patient confused    Reviewed: Allergy & Precautions, H&P , NPO status , Patient's Chart, lab work & pertinent test results  Airway Mallampati: II       Dental   Pulmonary Current Smoker,  breath sounds clear to auscultation  Pulmonary exam normal       Cardiovascular Exercise Tolerance: Good Normal cardiovascular examRhythm:regular Rate:Normal     Neuro/Psych    GI/Hepatic   Endo/Other    Renal/GU      Musculoskeletal   Abdominal   Peds  Hematology   Anesthesia Other Findings   Reproductive/Obstetrics (+) Pregnancy                             Anesthesia Physical Anesthesia Plan  ASA: II  Anesthesia Plan:    Post-op Pain Management:    Induction:   Airway Management Planned:   Additional Equipment:   Intra-op Plan:   Post-operative Plan:   Informed Consent: I have reviewed the patients History and Physical, chart, labs and discussed the procedure including the risks, benefits and alternatives for the proposed anesthesia with the patient or authorized representative who has indicated his/her understanding and acceptance.     Plan Discussed with:   Anesthesia Plan Comments:         Anesthesia Quick Evaluation

## 2014-07-11 NOTE — H&P (Signed)
Evelyn [name] is a 24 y.o. female presenting for Induction of Labor for history of Stillbirth.  Maternal Medical History:  Reason for admission: Nausea. Induction of Labor for history of stillbirth  Contractions: Frequency: rare.   Perceived severity is mild.    Fetal activity: Perceived fetal activity is normal.   Last perceived fetal movement was within the past hour.    Prenatal complications: No bleeding, PIH, IUGR, pre-eclampsia or preterm labor.   Prenatal Complications - Diabetes: none.    OB History    Gravida Para Term Preterm AB TAB SAB Ectopic Multiple Living   5 3 3  1 1    2      Past Medical History  Diagnosis Date  . Eczema   . Anemia   . Sickle cell trait   . Prior pregnancy with fetal demise    Past Surgical History  Procedure Laterality Date  . Dilation and curettage of uterus     Family History: family history includes Hypertension in her father. Social History:  reports that she has been smoking Cigarettes.  She has been smoking about 0.25 packs per day. She has never used smokeless tobacco. She reports that she does not drink alcohol or use illicit drugs.   Prenatal Transfer Tool  Maternal Diabetes: No Genetic Screening: Declined Maternal Ultrasounds/Referrals: Normal Fetal Ultrasounds or other Referrals:  None Maternal Substance Abuse:  No Significant Maternal Medications:  None Significant Maternal Lab Results:  NoneGBS Positive Other Comments:  None  Review of Systems  Constitutional: Negative for fever, chills and malaise/fatigue.  Eyes: Negative for blurred vision.  Gastrointestinal: Negative for nausea, vomiting, abdominal pain, diarrhea and constipation.  Genitourinary: Negative for dysuria.  Neurological: Negative for dizziness and headaches.    Dilation: 3.5 Effacement (%): 70 Station: -2 Exam by:: Williams CNM Last menstrual period 10/14/2013, unknown if currently breastfeeding. Maternal Exam:  Uterine Assessment: Contraction  strength is mild.  Contraction frequency is rare.   Abdomen: Patient reports no abdominal tenderness. Fundal height is 38.   Estimated fetal weight is 7.   Fetal presentation: vertex  Introitus: Normal vulva. Normal vagina.  Vagina is negative for discharge.  Ferning test: not done.  Nitrazine test: not done. Amniotic fluid character: not assessed.  Pelvis: adequate for delivery.   Cervix: Cervix evaluated by digital exam.     Fetal Exam Fetal Monitor Review: Mode: ultrasound.   Baseline rate: 140.  Variability: moderate (6-25 bpm).   Pattern: accelerations present and no decelerations.    Fetal State Assessment: Category I - tracings are normal.     Physical Exam  Constitutional: She is oriented to person, place, and time. She appears well-developed and well-nourished. No distress.  HENT:  Head: Normocephalic.  Cardiovascular: Normal rate.   Respiratory: Effort normal.  GI: Soft. She exhibits no distension. There is no tenderness. There is no rebound and no guarding.  Genitourinary: Vagina normal. No vaginal discharge found.  Dilation: 3.5 Effacement (%): 70 Cervical Position: Posterior Station: -2 Presentation: Vertex Exam by:: Williams CNM   Musculoskeletal: Normal range of motion.  Neurological: She is alert and oriented to person, place, and time.  Skin: Skin is warm and dry.  Psychiatric: She has a normal mood and affect.    Prenatal labs: ABO, Rh: B/POS/-- (01/14 1600) Antibody: NEG (01/14 1600) Rubella: 0.81 (01/14 1600) RPR: NON REAC (03/03 1045)  HBsAg: NEGATIVE (01/14 1600)  HIV: NONREACTIVE (03/03 1045)  GBS: Positive (04/07 0000)   Assessment/Plan: A:  SIUP at 8159w0d  History of stillbirth       Induction of labor  P[facility]  [facility]       Routine orders        Pitocin induction of labor        PCN prophylaxis   WILLIAMS,MARIE 07/11/2014, 7:20 AM

## 2014-07-11 NOTE — Progress Notes (Signed)
   Evelyn [name] is a 24 y.o. [id number] at 2225w0d  admitted for induction of labor due to hx of stillbirth.  Subjective: Comfortable with epidural  Objective: Filed Vitals:   07/11/14 1933 07/11/14 1938 07/11/14 1940 07/11/14 2000  BP:   109/65 113/57  Pulse: 72 69 78 77  Temp:      TempSrc:      Resp:   18 18  Height:      Weight:      SpO2: 100% 100%        FHT:  FHR: 130 bpm, variability: moderate,  accelerations:  Present,  decelerations:  Absent UC:   irregular, every 2-5 minutes SVE:   Dilation: 6 Effacement (%): 80 Station: -1 Exam by:: Evelyn GinsL. Cresenzo, RN Pitocin @ 20 mu/min  Labs: Lab Results  Component Value Date   WBC 7.6 07/11/2014   HGB 10.0* 07/11/2014   HCT 29.8* 07/11/2014   MCV 72.0* 07/11/2014   PLT 269 07/11/2014    Assessment / Plan: Induction of labor due to hx of stillbirth,  progressing well on pitocin  Labor: Progressing normally Fetal Wellbeing:  Category I Pain Control:  Epidural Anticipated MOD:  NSVD  [name],Evelyn Bradham 07/11/2014, 8:46 PM

## 2014-07-11 NOTE — Anesthesia Procedure Notes (Signed)
Epidural Patient location during procedure: OB Start time: 07/11/2014 6:45 PM End time: 07/11/2014 7:00 PM  Staffing Anesthesiologist: Sebastian AcheMANNY, Collins Dimaria Performed by: anesthesiologist   Preanesthetic Checklist Completed: patient identified, site marked, surgical consent, pre-op evaluation, timeout performed, IV checked, risks and benefits discussed and monitors and equipment checked  Epidural Patient position: sitting Prep: site prepped and draped and DuraPrep Patient monitoring: heart rate, continuous pulse ox and blood pressure Approach: midline Location: L2-L3 Injection technique: LOR air  Needle:  Needle type: Tuohy  Needle gauge: 17 G Needle length: 9 cm and 9 Needle insertion depth: 6 cm Catheter type: closed end flexible Catheter size: 19 Gauge Catheter at skin depth: 14 cm Test dose: negative  Assessment Events: blood not aspirated, injection not painful, no injection resistance, negative IV test and no paresthesia  Additional Notes   Patient tolerated the insertion well without complications.Reason for block:procedure for pain

## 2014-07-12 NOTE — Anesthesia Postprocedure Evaluation (Signed)
Anesthesia Post Note  Patient: Evelyn MouldsLamica Car  Procedure(s) Performed: * No procedures listed *  Anesthesia type: Epidural  Patient location: Mother/Baby  Post pain: Pain level controlled  Post assessment: Post-op Vital signs reviewed  Last Vitals:  Filed Vitals:   07/12/14 0434  BP: 117/60  Pulse: 60  Temp: 36.6 C  Resp: 20    Post vital signs: Reviewed  Level of consciousness:alert  Complications: No apparent anesthesia complications

## 2014-07-12 NOTE — Progress Notes (Signed)
UR chart review completed.  

## 2014-07-12 NOTE — Progress Notes (Signed)
Post Partum Day 1 Subjective: no complaints, up ad lib, voiding and tolerating PO, small lochia, plans to breastfeed, plans BTL in 4 weeks.  Will sign papers today  Objective: Blood pressure 117/60, pulse 60, temperature 97.8 F (36.6 C), temperature source Oral, resp. rate 20, height 5\' 7"  (1.702 m), weight 77.565 kg (171 lb), last menstrual period 10/14/2013, SpO2 100 %, unknown if currently breastfeeding.  Physical Exam:  General: alert, cooperative and no distress Lochia:normal flow Chest: CTAB Heart: RRR no m/r/g Abdomen: +BS, soft, nontender,  Uterine Fundus: firm DVT Evaluation: No evidence of DVT seen on physical exam. Extremities: no edema   Recent Labs  07/11/14 0728  HGB 10.0*  HCT 29.8*    Assessment/Plan: Plan for discharge tomorrow and Lactation consult   LOS: 1 day   CRESENZO-DISHMAN,Evelyn [name] 07/12/2014, 7:33 AM

## 2014-07-13 NOTE — Discharge Instructions (Signed)

## 2014-07-13 NOTE — Discharge Summary (Signed)
Obstetric Discharge Summary Reason for Admission: induction of labor history of stillbirth Prenatal Procedures:  Intrapartum Procedures: spontaneous vaginal delivery Postpartum Procedures: none Complications-Operative and Postpartum: none    Delivery Note After a 15 minute 2nd stage, at 9:35 PM a viable female was delivered via Vaginal, Spontaneous Delivery (Presentation: Left Occiput Anterior). There was a tight nuchal cord X2, delivered through. The shoulders were a little slow, so the posterior (left) axilla was grasped with my index finger, and the baby was rotated clockwise into the oblique diameter. At this point, the (now) anterior shoulder was released, and the baby delivered. At no time was any traction placed on the baby's head. APGAR: 8, 9; weight pending. 40 units of pitocin diluted in 1000cc LR was infused rapidly IV. The placenta separated spontaneously and delivered via CCT and maternal pushing effort. It was inspected and appears to be intact with a 3 VC.  Marland Kitchen.  Anesthesia: Epidural  Episiotomy: None Lacerations: None Suture Repair: n/a Est. Blood Loss (mL): 100  Mom to postpartum. Baby to Couplet care / Skin to Skin.  Evelyn [name],Evelyn [name] 07/11/2014, 9:52 PM HEMOGLOBIN  Date Value Ref Range Status  07/11/2014 10.0* 12.0 - 15.0 g/dL Final  16/10/960401/14/2016 54.011.6 g/dL Final   HCT  Date Value Ref Range Status  07/11/2014 29.8* 36.0 - 46.0 % Final  03/21/2014 35 % Final    Physical Exam:  General: alert, cooperative and appears stated age Lochia: appropriate Uterine Fundus: firm Incision:  DVT Evaluation: No evidence of DVT seen on physical exam. Negative Homan's sign.  Discharge Diagnoses: Term Pregnancy-delivered  Discharge Information: Date: 07/13/2014 Activity: unrestricted Diet: routine Medications: None Condition: stable Instructions:  Discharge to: home Follow-up Information    [facility]   Specialty:   Obstetrics and Gynecology   Contact information:   [address] [place] [id number] [phone]      Newborn Data: Live born female  Birth Weight: 8 lb 4.3 oz (3750 g) APGAR: 8, 9  Home with mother.  Evelyn [name] 07/13/2014, 12:30 PM

## 2014-07-17 ENCOUNTER — Encounter: Payer: Medicaid Other | Admitting: Obstetrics & Gynecology

## 2014-07-24 NOTE — Progress Notes (Signed)
NST reactive.

## 2014-08-07 ENCOUNTER — Emergency Department (HOSPITAL_COMMUNITY)
Admission: EM | Admit: 2014-08-07 | Discharge: 2014-08-08 | Disposition: A | Discharge status: Home / Self Care | Payer: Medicaid Other | Attending: Emergency Medicine | Admitting: Emergency Medicine

## 2014-08-07 ENCOUNTER — Encounter (HOSPITAL_COMMUNITY): Payer: Self-pay | Admitting: *Deleted

## 2014-08-07 DIAGNOSIS — M545 Low back pain: Secondary | ICD-10-CM | POA: Diagnosis present

## 2014-08-07 DIAGNOSIS — Z862 Personal history of diseases of the blood and blood-forming organs and certain disorders involving the immune mechanism: Secondary | ICD-10-CM | POA: Diagnosis not present

## 2014-08-07 DIAGNOSIS — Z872 Personal history of diseases of the skin and subcutaneous tissue: Secondary | ICD-10-CM | POA: Diagnosis not present

## 2014-08-07 DIAGNOSIS — N390 Urinary tract infection, site not specified: Secondary | ICD-10-CM | POA: Diagnosis not present

## 2014-08-07 DIAGNOSIS — Z720 Tobacco use: Secondary | ICD-10-CM | POA: Diagnosis not present

## 2014-08-07 DIAGNOSIS — M549 Dorsalgia, unspecified: Secondary | ICD-10-CM

## 2014-08-07 NOTE — ED Notes (Signed)
Pt c/o increased pain with palpation to left and right sides of spine, but worse directly on lumbar spine.  No redness, swelling, heat, or discharge noted from site of epidural to lower back.    Pt sts she has slowed on her post delivery bleeding, but still feels like her back feels like she is having menstrual cycle cramps.

## 2014-08-07 NOTE — ED Provider Notes (Signed)
CSN: [id number]     Arrival date & time 08/07/14  2247 History  This chart was scribed for Marisa Severinlga Cipriano Millikan, MD by Annye AsaAnna Dorsett, ED Scribe. This patient was seen in room D31C/D31C and the patient's care was started at 1:51 AM.    Chief Complaint  Patient presents with  . Back Pain  . Chills   The history is provided by the patient. No language interpreter was used.    HPI Comments: Evelyn [name] is a 24 y.o. female who presents to the Emergency Department complaining of 1 week of back pain and two days of subjective fever, chills and nausea. She reports "pressure" when urinating; she also reports two instances of urinary incontinence PTA tonight, reports she did not know that she needed to urinate before finding that she had urinated on herself.. She notes gradually worsening lower abdominal cramping for which she has taken Goody powders "every two hours" without relief. She denies any weakness to the lower extremities, denies perianal numbness.   She explains she had an epidural on 07/11/14. She is not breastfeeding at this time.   Past Medical History  Diagnosis Date  . Eczema   . Anemia   . Sickle cell trait   . Prior pregnancy with fetal demise    Past Surgical History  Procedure Laterality Date  . Dilation and curettage of uterus     Family History  Problem Relation Age of Onset  . Hypertension Father    History  Substance Use Topics  . Smoking status: Current Every Day Smoker -- 0.25 packs/day    Types: Cigarettes    Last Attempt to Quit: 05/22/2012  . Smokeless tobacco: Never Used  . Alcohol Use: No   OB History    Gravida Para Term Preterm AB TAB SAB Ectopic Multiple Living   5 4 4  1 1    0 3     Review of Systems  Constitutional: Positive for fever (Subjective) and chills.  Gastrointestinal: Positive for nausea and abdominal pain.  Musculoskeletal: Positive for back pain.  Neurological: Negative for weakness and numbness.  All other systems reviewed and are  negative.     Allergies  Review of patient's allergies indicates no known allergies.  Home Medications   Prior to Admission medications   Medication Sig Start Date End Date Taking? Authorizing Provider  Prenatal Vit-Fe Fumarate-FA (PRENATAL VITAMINS PLUS) 27-1 MG TABS Take 1 tablet by mouth daily.    Historical Provider, MD   BP 102/54 mmHg  Pulse 74  Temp(Src) 98.2 F (36.8 C) (Oral)  Resp 18  Ht 5\' 8"  (1.727 m)  Wt 150 lb (68.04 kg)  BMI 22.81 kg/m2  SpO2 97% Physical Exam  Constitutional: She is oriented to person, place, and time. She appears well-developed and well-nourished. No distress.  HENT:  Head: Normocephalic and atraumatic.  Mouth/Throat: Oropharynx is clear and moist. No oropharyngeal exudate.  Moist mucous membranes  Eyes: EOM are normal. Pupils are equal, round, and reactive to light.  Neck: Normal range of motion. Neck supple. No JVD present.  Cardiovascular: Normal rate, regular rhythm, normal heart sounds and intact distal pulses.  Exam reveals no gallop and no friction rub.   No murmur heard. Pulmonary/Chest: Effort normal and breath sounds normal. No respiratory distress. She has no wheezes. She has no rales.  Abdominal: Soft. Bowel sounds are normal. She exhibits no mass. There is no tenderness. There is no rebound and no guarding.  Musculoskeletal: Normal range of motion. She exhibits tenderness (  patient has tenderness paraspinal muscles and lumbar.  She has no step-off or crepitus, but does have point tenderness along the spinous process of the lumbar region). She exhibits no edema.  Moves all extremities normally.   Lymphadenopathy:    She has no cervical adenopathy.  Neurological: She is alert and oriented to person, place, and time. She displays normal reflexes. No cranial nerve deficit. Coordination normal.  Skin: Skin is warm and dry. No rash noted.  Psychiatric: She has a normal mood and affect. Her behavior is normal.  Nursing note and vitals  reviewed.   ED Course  Procedures   DIAGNOSTIC STUDIES: Oxygen Saturation is 100% on RA, normal by my interpretation.    COORDINATION OF CARE: 2:04 AM Discussed treatment plan with pt at bedside and pt agreed to plan.   Labs Review Labs Reviewed  C-REACTIVE PROTEIN - Abnormal; Notable for the following:    CRP 19.5 (*)    All other components within normal limits  CBC WITH DIFFERENTIAL/PLATELET - Abnormal; Notable for the following:    WBC 13.0 (*)    RBC 5.15 (*)    MCV 71.5 (*)    MCH 23.3 (*)    Neutro Abs 9.9 (*)    Lymphocytes Relative 11 (*)    Monocytes Relative 13 (*)    Monocytes Absolute 1.7 (*)    All other components within normal limits  SEDIMENTATION RATE - Abnormal; Notable for the following:    Sed Rate 35 (*)    All other components within normal limits  URINALYSIS, ROUTINE W REFLEX MICROSCOPIC (NOT AT [facility]) - Abnormal; Notable for the following:    APPearance CLOUDY (*)    Hgb urine dipstick SMALL (*)    Protein, ur 30 (*)    Nitrite POSITIVE (*)    Leukocytes, UA LARGE (*)    All other components within normal limits  BASIC METABOLIC PANEL - Abnormal; Notable for the following:    Potassium 3.3 (*)    CO2 19 (*)    Calcium 8.4 (*)    All other components within normal limits  URINE MICROSCOPIC-ADD ON - Abnormal; Notable for the following:    Bacteria, UA MANY (*)    All other components within normal limits  CULTURE, BLOOD (ROUTINE X 2)  CULTURE, BLOOD (ROUTINE X 2)  URINE CULTURE    Imaging Review No results found.   EKG Interpretation None      MDM   Final diagnoses:  Back pain  UTI (urinary tract infection) with pyuria    I personally performed the services described in this documentation, which was scribed in my presence. The recorded information has been reviewed and is accurate.  24 year old female with low back pain in the area of her epidural, reports that she has urinated herself without knowing, fever, chills.  Concern  for possible epidural abscess given her symptoms.  Differential also includes UTI.  Plan for urinalysis, MRI.   MRI without epidural abscess.  Urinalysis shows significant urinary tract infection.  Patient be treated with antibodies and given strict instructions for return for worsening symptoms.     Marisa Severin, MD 08/08/14 [phone]

## 2014-08-07 NOTE — ED Notes (Signed)
Pt c/o back pain since 07/27/14. States that she pain is where the site of her epidural was. States that she had a baby 1 month ago. Also c/o fever/chills.

## 2014-08-08 ENCOUNTER — Emergency Department (HOSPITAL_COMMUNITY): Payer: Medicaid Other

## 2014-08-08 LAB — CBC WITH DIFFERENTIAL/PLATELET
Basophils Absolute: 0 10*3/uL (ref 0.0–0.1)
Basophils Relative: 0 % (ref 0–1)
EOS ABS: 0 10*3/uL (ref 0.0–0.7)
Eosinophils Relative: 0 % (ref 0–5)
HEMATOCRIT: 36.8 % (ref 36.0–46.0)
Hemoglobin: 12 g/dL (ref 12.0–15.0)
LYMPHS PCT: 11 % — AB (ref 12–46)
Lymphs Abs: 1.4 10*3/uL (ref 0.7–4.0)
MCH: 23.3 pg — AB (ref 26.0–34.0)
MCHC: 32.6 g/dL (ref 30.0–36.0)
MCV: 71.5 fL — ABNORMAL LOW (ref 78.0–100.0)
MONO ABS: 1.7 10*3/uL — AB (ref 0.1–1.0)
MONOS PCT: 13 % — AB (ref 3–12)
NEUTROS ABS: 9.9 10*3/uL — AB (ref 1.7–7.7)
NEUTROS PCT: 76 % (ref 43–77)
Platelets: 269 10*3/uL (ref 150–400)
RBC: 5.15 MIL/uL — ABNORMAL HIGH (ref 3.87–5.11)
RDW: 15.4 % (ref 11.5–15.5)
WBC: 13 10*3/uL — ABNORMAL HIGH (ref 4.0–10.5)

## 2014-08-08 LAB — URINE MICROSCOPIC-ADD ON

## 2014-08-08 LAB — BASIC METABOLIC PANEL
Anion gap: 12 (ref 5–15)
BUN: 6 mg/dL (ref 6–20)
CALCIUM: 8.4 mg/dL — AB (ref 8.9–10.3)
CHLORIDE: 105 mmol/L (ref 101–111)
CO2: 19 mmol/L — ABNORMAL LOW (ref 22–32)
Creatinine, Ser: 0.98 mg/dL (ref 0.44–1.00)
Glucose, Bld: 96 mg/dL (ref 65–99)
Potassium: 3.3 mmol/L — ABNORMAL LOW (ref 3.5–5.1)
SODIUM: 136 mmol/L (ref 135–145)

## 2014-08-08 LAB — SEDIMENTATION RATE: SED RATE: 35 mm/h — AB (ref 0–22)

## 2014-08-08 LAB — URINALYSIS, ROUTINE W REFLEX MICROSCOPIC
Bilirubin Urine: NEGATIVE
Glucose, UA: NEGATIVE mg/dL
Ketones, ur: NEGATIVE mg/dL
NITRITE: POSITIVE — AB
PH: 5.5 (ref 5.0–8.0)
Protein, ur: 30 mg/dL — AB
Specific Gravity, Urine: 1.016 (ref 1.005–1.030)
Urobilinogen, UA: 0.2 mg/dL (ref 0.0–1.0)

## 2014-08-08 LAB — C-REACTIVE PROTEIN: CRP: 19.5 mg/dL — AB (ref <1.0)

## 2014-08-08 MED ORDER — NAPROXEN 500 MG PO TABS: 500.0000 mg | ORAL_TABLET | Freq: Two times a day (BID) | ORAL | Status: DC

## 2014-08-08 MED ORDER — SULFAMETHOXAZOLE-TRIMETHOPRIM 800-160 MG PO TABS
1.0000 | ORAL_TABLET | Freq: Once | ORAL | Status: AC
  Administered 2014-08-08: 1 via ORAL
  Filled 2014-08-08: qty 1

## 2014-08-08 MED ORDER — ACETAMINOPHEN 325 MG PO TABS
325.0000 mg | ORAL_TABLET | Freq: Once | ORAL | Status: AC
  Administered 2014-08-08: 325 mg via ORAL
  Filled 2014-08-08: qty 1

## 2014-08-08 MED ORDER — SULFAMETHOXAZOLE-TRIMETHOPRIM 800-160 MG PO TABS: 1.0000 | ORAL_TABLET | Freq: Two times a day (BID) | ORAL | Status: AC

## 2014-08-08 MED ORDER — NAPROXEN 250 MG PO TABS
500.0000 mg | ORAL_TABLET | Freq: Once | ORAL | Status: AC
  Administered 2014-08-08: 500 mg via ORAL
  Filled 2014-08-08: qty 2

## 2014-08-08 MED ORDER — GADOBENATE DIMEGLUMINE 529 MG/ML IV SOLN
15.0000 mL | Freq: Once | INTRAVENOUS | Status: AC | PRN
  Administered 2014-08-08: 15 mL via INTRAVENOUS

## 2014-08-08 NOTE — Discharge Instructions (Signed)
Your workup today has shown that you have a urinary tract infection.  This is most likely causing your back pain.  Take medications as prescribed.  Tylenol as needed for fever.  Return to the emergency department for worsening pain, vomiting, persistent fever, or other new concerning symptoms.    Urinary Tract Infection Urinary tract infections (UTIs) can develop anywhere along your urinary tract. Your urinary tract is your body's drainage system for removing wastes and extra water. Your urinary tract includes two kidneys, two ureters, a bladder, and a urethra. Your kidneys are a pair of bean-shaped organs. Each kidney is about the size of your fist. They are located below your ribs, one on each side of your spine. CAUSES Infections are caused by microbes, which are microscopic organisms, including fungi, viruses, and bacteria. These organisms are so small that they can only be seen through a microscope. Bacteria are the microbes that most commonly cause UTIs. SYMPTOMS  Symptoms of UTIs may vary by age and gender of the patient and by the location of the infection. Symptoms in young women typically include a frequent and intense urge to urinate and a painful, burning feeling in the bladder or urethra during urination. Older women and men are more likely to be tired, shaky, and weak and have muscle aches and abdominal pain. A fever may mean the infection is in your kidneys. Other symptoms of a kidney infection include pain in your back or sides below the ribs, nausea, and vomiting. DIAGNOSIS To diagnose a UTI, your caregiver will ask you about your symptoms. Your caregiver also will ask to provide a urine sample. The urine sample will be tested for bacteria and white blood cells. White blood cells are made by your body to help fight infection. TREATMENT  Typically, UTIs can be treated with medication. Because most UTIs are caused by a bacterial infection, they usually can be treated with the use of  antibiotics. The choice of antibiotic and length of treatment depend on your symptoms and the type of bacteria causing your infection. HOME CARE INSTRUCTIONS  If you were prescribed antibiotics, take them exactly as your caregiver instructs you. Finish the medication even if you feel better after you have only taken some of the medication.  Drink enough water and fluids to keep your urine clear or pale yellow.  Avoid caffeine, tea, and carbonated beverages. They tend to irritate your bladder.  Empty your bladder often. Avoid holding urine for long periods of time.  Empty your bladder before and after sexual intercourse.  After a bowel movement, women should cleanse from front to back. Use each tissue only once. SEEK MEDICAL CARE IF:   You have back pain.  You develop a fever.  Your symptoms do not begin to resolve within 3 days. SEEK IMMEDIATE MEDICAL CARE IF:   You have severe back pain or lower abdominal pain.  You develop chills.  You have nausea or vomiting.  You have continued burning or discomfort with urination. MAKE SURE YOU:   Understand these instructions.  Will watch your condition.  Will get help right away if you are not doing well or get worse. Document Released: 12/02/2004 Document Revised: 08/24/2011 Document Reviewed: 04/02/2011 [facility] Patient Information 2015 [employer] [place]. This information is not intended to replace advice given to you by your health care provider. Make sure you discuss any questions you have with your health care provider.

## 2014-08-08 NOTE — ED Notes (Signed)
Informed Dr. Norlene Campbelltter and Lajean SilviusJenee - RN of pt 100.7 temperature

## 2014-08-08 NOTE — ED Notes (Signed)
Patient off to MRI

## 2014-08-10 LAB — URINE CULTURE: Colony Count: >=100000

## 2014-08-11 ENCOUNTER — Telehealth (HOSPITAL_COMMUNITY): Payer: Self-pay

## 2014-08-11 NOTE — Telephone Encounter (Signed)
Post ED Visit - Positive Culture Follow-up  Culture report reviewed by antimicrobial stewardship pharmacist: []  Wes Dulaney, Pharm.D., BCPS []  Celedonio MiyamotoJeremy Frens, Pharm.D., BCPS []  Georgina PillionElizabeth Martin, Pharm.D., BCPS []  AntelopeMinh Pham, [place]D., BCPS, AAHIVP [x]  Estella HuskMichelle Turner, Pharm.D., BCPS, AAHIVP []  Elder CyphersLorie Poole, [address]Pharm[address]D., BCPS  Positive Urine culture, >/= 100,000 colonies -> E Coli Treated with Sulfa-Trimeth, organism sensitive to the same and no further patient follow-up is required at this time.  Arvid RightClark, Avanna Sowder Dorn 08/11/2014, 7:51 PM

## 2014-08-14 ENCOUNTER — Encounter: Payer: Self-pay | Admitting: Obstetrics & Gynecology

## 2014-08-14 ENCOUNTER — Ambulatory Visit (INDEPENDENT_AMBULATORY_CARE_PROVIDER_SITE_OTHER): Payer: Medicaid Other | Admitting: Obstetrics & Gynecology

## 2014-08-14 DIAGNOSIS — Z3009 Encounter for other general counseling and advice on contraception: Secondary | ICD-10-CM

## 2014-08-14 LAB — CULTURE, BLOOD (ROUTINE X 2)
Culture: NO GROWTH
Culture: NO GROWTH

## 2014-08-14 MED ORDER — NORGESTIM-ETH ESTRAD TRIPHASIC 0.18/0.215/0.25 MG-25 MCG PO TABS: 1.0000 | ORAL_TABLET | Freq: Every day | ORAL | Status: DC

## 2014-08-14 NOTE — Patient Instructions (Signed)
Laparoscopic Tubal Ligation Laparoscopic tubal ligation is a procedure that closes the fallopian tubes at a time other than right after childbirth. By closing the fallopian tubes, the eggs that are released from the ovaries cannot enter the uterus and sperm cannot reach the egg. Tubal ligation is also known as getting your "tubes tied." Tubal ligation is done so you will not be able to get pregnant or have a baby.  Although this procedure may be reversed, it should be considered permanent and irreversible. If you want to have future pregnancies, you should not have this procedure.  LET YOUR CAREGIVER KNOW ABOUT:  Allergies to food or medicine.  Medicines taken, including vitamins, herbs, eyedrops, over-the-counter medicines, and creams.  Use of steroids (by mouth or creams).  Previous problems with numbing medicines.  History of bleeding problems or blood clots.  Any recent colds or infections.  Previous surgery.  Other health problems, including diabetes and kidney problems.  Possibility of pregnancy, if this applies.  Any past pregnancies. RISKS AND COMPLICATIONS   Infection.  Bleeding.  Injury to surrounding organs.  Anesthetic side effects.  Failure of the procedure.  Ectopic pregnancy.  Future regret about having the procedure done. BEFORE THE PROCEDURE  Do not take aspirin or blood thinners a week before the procedure or as directed. This can cause bleeding.  Do not eat or drink anything 6 to 8 hours before the procedure. PROCEDURE   You may be given a medicine to help you relax (sedative) before the procedure. You will be given a medicine to make you sleep (general anesthetic) during the procedure.  A tube will be put down your throat to help your breath while under general anesthesia.  Two small cuts (incisions) are made in the lower abdominal area and near the belly button.  Your abdominal area will be inflated with a safe gas (carbon dioxide). This helps  give the surgeon room to operate, visualize, and helps the surgeon avoid other organs.  A thin, lighted tube (laparoscope) with a camera attached is inserted into your abdomen through one of the incisions near the belly button. Other small instruments are also inserted through the other abdominal incision.  The fallopian tubes are located and are either blocked with a ring, clip, or are burned (cauterized).  After the fallopian tubes are blocked, the gas is released from the abdomen.  The incisions will be closed with stitches (sutures), and a bandage may be placed over the incisions. AFTER THE PROCEDURE   You will rest in a recovery room for 1--4 hours until you are stable and doing well.  You will also have some mild abdominal discomfort for 3--7 days. You will be given pain medicine to ease any discomfort.  As long as there are no problems, you may be allowed to go home. Someone will need to drive you home and be with you for at least 24 hours once home.  You may have some mild discomfort in the throat. This is from the tube placed in your throat while you were sleeping.  You may experience discomfort in the shoulder area from some trapped air between the liver and diaphragm. This sensation is normal and will slowly go away on its own. Document Released: 05/31/2000 Document Revised: 08/24/2011 Document Reviewed: 06/05/2011 Exit[employer] Patient Information 2015 [employer]. This information is not intended to replace advice given to you by your health care provider. Make sure you discuss any questions you have with your health care provider.  

## 2014-08-14 NOTE — Progress Notes (Signed)
Subjective:wants BTL   cc:  Postpartum and wants sterilization  Evelyn [name] is a 24 y.o. female who presents for a postpartum visit. She is 4 weeks postpartum following a spontaneous vaginal delivery. I have fully reviewed the prenatal and intrapartum course. The delivery was at 39 gestational weeks. Outcome: spontaneous vaginal delivery. Anesthesia: epidural. Postpartum course has been good. Baby's course has been good. Baby is feeding by bottle -  . Bleeding no bleeding. Bowel function is normal. Bladder function is normal. Patient is not sexually active. Contraception method is abstinence and tubal ligation. Postpartum depression screening: negative.  The following portions of the patient's history were reviewed and updated as appropriate: allergies, current medications, past family history, past medical history, past social history, past surgical history and problem list.  Review of Systems Pertinent items are noted in HPI.   Objective:    BP 110/57 mmHg  Pulse 72  Temp(Src) 98.6 F (37 C) (Oral)  Ht 5\' 5"  (1.651 m)  Wt 145 lb 11.2 oz (66.089 kg)  BMI 24.25 kg/m2  Breastfeeding? No  General:  alert, cooperative and no distress      Abd soft no mass not tender Assessment:     normal postpartum exam. Pap smear not done at today's visit.   Plan:    1. Contraception: OCP (estrogen/progesterone) and tubal ligation signed papers today needs 30+ days 2. OCP until surgery trisprintec 3. Follow up  as needed.    Adam PhenixJames G Cornel Werber, MD 08/14/2014

## 2014-08-15 ENCOUNTER — Encounter (HOSPITAL_COMMUNITY): Payer: Self-pay | Admitting: *Deleted

## 2014-08-15 LAB — POCT PREGNANCY, URINE: PREG TEST UR: NEGATIVE

## 2014-08-20 ENCOUNTER — Encounter: Payer: Self-pay | Admitting: General Practice

## 2014-09-03 ENCOUNTER — Inpatient Hospital Stay (HOSPITAL_COMMUNITY)
Admission: RE | Admit: 2014-09-03 | Discharge: 2014-09-03 | Disposition: A | Discharge status: Home / Self Care | Payer: Medicaid Other | Source: Ambulatory Visit

## 2014-09-04 ENCOUNTER — Encounter (HOSPITAL_COMMUNITY): Payer: Self-pay | Admitting: *Deleted

## 2014-09-05 ENCOUNTER — Other Ambulatory Visit: Payer: Self-pay | Admitting: Obstetrics & Gynecology

## 2014-09-16 ENCOUNTER — Encounter (HOSPITAL_COMMUNITY): Payer: Self-pay | Admitting: Anesthesiology

## 2014-09-16 NOTE — Anesthesia Preprocedure Evaluation (Deleted)
Anesthesia Evaluation  Patient identified by MRN, date of birth, ID band Patient awake    Reviewed: Allergy & Precautions, NPO status , Patient's Chart, lab work & pertinent test results  Airway Mallampati: II  TM Distance: >3 FB Neck ROM: Full    Dental no notable dental hx. (+) Dental Advisory Given   Pulmonary Current Smoker,  breath sounds clear to auscultation  Pulmonary exam normal       Cardiovascular negative cardio ROS Normal cardiovascular examRhythm:Regular Rate:Normal     Neuro/Psych negative neurological ROS  negative psych ROS   GI/Hepatic negative GI ROS, Neg liver ROS,   Endo/Other  negative endocrine ROS  Renal/GU negative Renal ROS  negative genitourinary   Musculoskeletal negative musculoskeletal ROS (+)   Abdominal   Peds negative pediatric ROS (+)  Hematology negative hematology ROS (+)   Anesthesia Other Findings   Reproductive/Obstetrics negative OB ROS                             Anesthesia Physical Anesthesia Plan  ASA: II  Anesthesia Plan: General   Post-op Pain Management:    Induction: Intravenous  Airway Management Planned: Oral ETT  Additional Equipment:   Intra-op Plan:   Post-operative Plan: Extubation in OR  Informed Consent: I have reviewed the patients History and Physical, chart, labs and discussed the procedure including the risks, benefits and alternatives for the proposed anesthesia with the patient or authorized representative who has indicated his/her understanding and acceptance.   Dental advisory given  Plan Discussed with: CRNA  Anesthesia Plan Comments:         Anesthesia Quick Evaluation

## 2014-09-17 ENCOUNTER — Encounter (HOSPITAL_COMMUNITY)
Admission: RE | Disposition: A | Discharge status: Home / Self Care | Payer: Self-pay | Source: Ambulatory Visit | Attending: Obstetrics & Gynecology

## 2014-09-17 ENCOUNTER — Ambulatory Visit (HOSPITAL_COMMUNITY)
Admission: RE | Admit: 2014-09-17 | Discharge: 2014-09-17 | Disposition: A | Discharge status: Home / Self Care | Payer: Medicaid Other | Source: Ambulatory Visit | Attending: Obstetrics & Gynecology | Admitting: Obstetrics & Gynecology

## 2014-09-17 SURGERY — LIGATION, FALLOPIAN TUBE, LAPAROSCOPIC
Anesthesia: Choice | Site: Abdomen | Laterality: Bilateral

## 2014-09-19 NOTE — H&P (Signed)
Patient presented for LBTL but had eaten candy before arriving, surgery cancelled by anesthesia. She was d/c before I could see her.  Adam PhenixJames G Zahid Carneiro, MD 09/19/2014

## 2014-10-07 ENCOUNTER — Telehealth: Payer: Self-pay | Admitting: *Deleted

## 2014-10-07 NOTE — Telephone Encounter (Signed)
Patient left message on nurse line on 10/07/14 at 1123.  States she has cancelled her surgery and requests a return call.  Attempted to contact patient.  No answer left message to return our call.

## 2014-10-08 ENCOUNTER — Encounter (HOSPITAL_COMMUNITY): Admission: RE | Payer: Self-pay | Source: Ambulatory Visit

## 2014-10-08 ENCOUNTER — Ambulatory Visit (HOSPITAL_COMMUNITY)
Admission: RE | Admit: 2014-10-08 | Payer: Medicaid Other | Source: Ambulatory Visit | Admitting: Obstetrics & Gynecology

## 2014-10-08 SURGERY — LIGATION, FALLOPIAN TUBE, LAPAROSCOPIC
Anesthesia: Choice | Site: Abdomen | Laterality: Bilateral

## 2014-10-08 NOTE — Telephone Encounter (Signed)
Called patient, no answer- left message stating we are trying to reach you to return your phone call, please call us back at the clinics if you need assistance 

## 2015-01-13 ENCOUNTER — Encounter: Payer: Self-pay | Admitting: *Deleted

## 2015-05-27 ENCOUNTER — Encounter (HOSPITAL_COMMUNITY): Payer: Self-pay | Admitting: Emergency Medicine

## 2015-05-27 ENCOUNTER — Emergency Department (HOSPITAL_COMMUNITY): Payer: Medicaid Other

## 2015-05-27 ENCOUNTER — Emergency Department (HOSPITAL_COMMUNITY)
Admission: EM | Admit: 2015-05-27 | Discharge: 2015-05-27 | Disposition: A | Discharge status: Home / Self Care | Payer: Medicaid Other | Attending: Emergency Medicine | Admitting: Emergency Medicine

## 2015-05-27 DIAGNOSIS — Z872 Personal history of diseases of the skin and subcutaneous tissue: Secondary | ICD-10-CM | POA: Diagnosis not present

## 2015-05-27 DIAGNOSIS — B9689 Other specified bacterial agents as the cause of diseases classified elsewhere: Secondary | ICD-10-CM

## 2015-05-27 DIAGNOSIS — N760 Acute vaginitis: Secondary | ICD-10-CM | POA: Insufficient documentation

## 2015-05-27 DIAGNOSIS — N83201 Unspecified ovarian cyst, right side: Secondary | ICD-10-CM | POA: Diagnosis not present

## 2015-05-27 DIAGNOSIS — J029 Acute pharyngitis, unspecified: Secondary | ICD-10-CM | POA: Diagnosis not present

## 2015-05-27 DIAGNOSIS — Z862 Personal history of diseases of the blood and blood-forming organs and certain disorders involving the immune mechanism: Secondary | ICD-10-CM | POA: Diagnosis not present

## 2015-05-27 DIAGNOSIS — F17210 Nicotine dependence, cigarettes, uncomplicated: Secondary | ICD-10-CM | POA: Diagnosis not present

## 2015-05-27 DIAGNOSIS — R05 Cough: Secondary | ICD-10-CM | POA: Diagnosis not present

## 2015-05-27 DIAGNOSIS — N390 Urinary tract infection, site not specified: Secondary | ICD-10-CM

## 2015-05-27 DIAGNOSIS — N898 Other specified noninflammatory disorders of vagina: Secondary | ICD-10-CM | POA: Diagnosis present

## 2015-05-27 DIAGNOSIS — R0981 Nasal congestion: Secondary | ICD-10-CM | POA: Diagnosis not present

## 2015-05-27 DIAGNOSIS — R102 Pelvic and perineal pain: Secondary | ICD-10-CM

## 2015-05-27 LAB — URINALYSIS, ROUTINE W REFLEX MICROSCOPIC
Bilirubin Urine: NEGATIVE
Glucose, UA: NEGATIVE mg/dL
Hgb urine dipstick: NEGATIVE
KETONES UR: NEGATIVE mg/dL
Nitrite: POSITIVE — AB
Protein, ur: NEGATIVE mg/dL
SPECIFIC GRAVITY, URINE: 1.016 (ref 1.005–1.030)
pH: 6.5 (ref 5.0–8.0)

## 2015-05-27 LAB — COMPREHENSIVE METABOLIC PANEL
ALT: 7 U/L — ABNORMAL LOW (ref 14–54)
AST: 15 U/L (ref 15–41)
Albumin: 4 g/dL (ref 3.5–5.0)
Alkaline Phosphatase: 58 U/L (ref 38–126)
Anion gap: 12 (ref 5–15)
BUN: 7 mg/dL (ref 6–20)
CALCIUM: 9.7 mg/dL (ref 8.9–10.3)
CHLORIDE: 104 mmol/L (ref 101–111)
CO2: 26 mmol/L (ref 22–32)
Creatinine, Ser: 0.96 mg/dL (ref 0.44–1.00)
GFR calc non Af Amer: >60 mL/min (ref >60)
Glucose, Bld: 102 mg/dL — ABNORMAL HIGH (ref 65–99)
POTASSIUM: 3.6 mmol/L (ref 3.5–5.1)
Sodium: 142 mmol/L (ref 135–145)
Total Bilirubin: 0.4 mg/dL (ref 0.3–1.2)
Total Protein: 7.2 g/dL (ref 6.5–8.1)

## 2015-05-27 LAB — URINE MICROSCOPIC-ADD ON

## 2015-05-27 LAB — WET PREP, GENITAL
Sperm: NONE SEEN
TRICH WET PREP: NONE SEEN
Yeast Wet Prep HPF POC: NONE SEEN

## 2015-05-27 LAB — CBC WITH DIFFERENTIAL/PLATELET
BASOS ABS: 0 10*3/uL (ref 0.0–0.1)
Basophils Relative: 0 %
EOS PCT: 4 %
Eosinophils Absolute: 0.2 10*3/uL (ref 0.0–0.7)
HCT: 42.7 % (ref 36.0–46.0)
Hemoglobin: 14.1 g/dL (ref 12.0–15.0)
Lymphocytes Relative: 29 %
Lymphs Abs: 1.9 10*3/uL (ref 0.7–4.0)
MCH: 24.7 pg — AB (ref 26.0–34.0)
MCHC: 33 g/dL (ref 30.0–36.0)
MCV: 74.7 fL — ABNORMAL LOW (ref 78.0–100.0)
MONO ABS: 0.6 10*3/uL (ref 0.1–1.0)
Monocytes Relative: 10 %
Neutro Abs: 3.6 10*3/uL (ref 1.7–7.7)
Neutrophils Relative %: 57 %
PLATELETS: 357 10*3/uL (ref 150–400)
RBC: 5.72 MIL/uL — ABNORMAL HIGH (ref 3.87–5.11)
RDW: 15.3 % (ref 11.5–15.5)
WBC: 6.3 10*3/uL (ref 4.0–10.5)

## 2015-05-27 LAB — POC URINE PREG, ED: PREG TEST UR: NEGATIVE

## 2015-05-27 MED ORDER — HYDROCODONE-ACETAMINOPHEN 5-325 MG PO TABS: 1.0000 | ORAL_TABLET | Freq: Four times a day (QID) | ORAL | Status: DC | PRN

## 2015-05-27 MED ORDER — NAPROXEN 500 MG PO TABS: 500.0000 mg | ORAL_TABLET | Freq: Two times a day (BID) | ORAL | Status: DC

## 2015-05-27 MED ORDER — SODIUM CHLORIDE 0.9 % IV SOLN
INTRAVENOUS | Status: AC
  Administered 2015-05-27: 17:00:00 via INTRAVENOUS

## 2015-05-27 MED ORDER — METRONIDAZOLE 500 MG PO TABS: 500.0000 mg | ORAL_TABLET | Freq: Two times a day (BID) | ORAL | Status: DC

## 2015-05-27 MED ORDER — DEXTROSE 5 % IV SOLN
1.0000 g | Freq: Once | INTRAVENOUS | Status: AC
  Administered 2015-05-27: 1 g via INTRAVENOUS
  Filled 2015-05-27: qty 10

## 2015-05-27 MED ORDER — CEPHALEXIN 500 MG PO CAPS: 500.0000 mg | ORAL_CAPSULE | Freq: Three times a day (TID) | ORAL | Status: DC

## 2015-05-27 MED ORDER — ONDANSETRON HCL 4 MG/2ML IJ SOLN
4.0000 mg | Freq: Once | INTRAMUSCULAR | Status: AC
  Administered 2015-05-27: 4 mg via INTRAVENOUS
  Filled 2015-05-27: qty 2

## 2015-05-27 NOTE — ED Provider Notes (Signed)
CSN: [id number]     Arrival date & time 05/27/15  1001 History  By signing my name below, I, Evelyn [name], attest that this documentation has been prepared under the direction and in the presence of Kerrie Buffalo, NP Electronically Signed: Soijett [name], ED Scribe. 05/27/2015. 1:40 PM.    Chief Complaint  Patient presents with  . Flank Pain  . Vaginal Discharge  . Cough     Patient is a 25 y.o. female presenting with vaginal discharge and pharyngitis. The history is provided by the patient. No language interpreter was used.  Vaginal Discharge Quality:  White, yellow and malodorous Severity:  Moderate Onset quality:  Gradual Duration:  2 weeks Timing:  Constant Progression:  Unchanged Chronicity:  Recurrent Context: after intercourse, at rest and during intercourse   Relieved by:  None tried Worsened by:  Nothing tried Ineffective treatments:  None tried Associated symptoms: dyspareunia   Associated symptoms: no dysuria, no fever, no nausea, no urinary frequency and no vomiting   Sore Throat This is a new problem. The current episode started 2 days ago. The problem occurs rarely. The problem has not changed since onset.Pertinent negatives include no headaches. Nothing aggravates the symptoms. Nothing relieves the symptoms. She has tried nothing for the symptoms. The treatment provided no relief.    Evelyn [name] is a 25 y.o. female who presents to the Emergency Department complaining of 3/10 pelvic pain x 2 weeks. She reports that her pelvic pain is a 3/10 while resting and 8/10 while ambulating. Pt notes that she has had BV in the past that was dx at the [facility] and her symptoms are similar to that. She states that she is having associated symptoms of malodorous urine, pressure while urinating, dyspareunia, and white/yellow malodorous vaginal discharge. She states that she has not tried any medications for the relief for her symptoms. She denies frequency and any other symptoms.    Pt secondarily complains of sore throat x 2-3 days ago. She notes that her sore throat began initially. Pt has associated symptoms of nasal congestion, fatigue, weakness, and cough. Denies taking any medications for the relief of her symptoms. Denies n/v, fever, sinus pressure, and any other symptoms.    Past Medical History  Diagnosis Date  . Eczema   . Anemia   . Sickle cell trait (HCC)   . Prior pregnancy with fetal demise    Past Surgical History  Procedure Laterality Date  . Dilation and curettage of uterus     Family History  Problem Relation Age of Onset  . Hypertension Father    Social History  Substance Use Topics  . Smoking status: Current Every Day Smoker -- 1.00 packs/day    Types: Cigarettes  . Smokeless tobacco: Never Used  . Alcohol Use: No   OB History    Gravida Para Term Preterm AB TAB SAB Ectopic Multiple Living   5 4 4  1 1    0 3     Review of Systems  Constitutional: Positive for fatigue. Negative for fever.  HENT: Positive for congestion and sore throat. Negative for sinus pressure.   Respiratory: Positive for cough.   Gastrointestinal: Negative for nausea and vomiting.  Genitourinary: Positive for vaginal discharge, pelvic pain and dyspareunia. Negative for dysuria.       Malodorous urine  Neurological: Positive for weakness. Negative for headaches.  All other systems reviewed and are negative.     Allergies  Review of patient's allergies indicates no known allergies.  Home Medications   Prior to Admission medications   Medication Sig Start Date End Date Taking? Authorizing Provider  cephALEXin (KEFLEX) 500 MG capsule Take 1 capsule (500 mg total) by mouth 3 (three) times daily. 05/27/15   Hope Orlene OchM Neese, NP  HYDROcodone-acetaminophen (NORCO) 5-325 MG tablet Take 1 tablet by mouth every 6 (six) hours as needed. 05/27/15   Hope Orlene OchM Neese, NP  metroNIDAZOLE (FLAGYL) 500 MG tablet Take 1 tablet (500 mg total) by mouth 2 (two) times daily. 05/27/15    Hope Orlene OchM Neese, NP  naproxen (NAPROSYN) 500 MG tablet Take 1 tablet (500 mg total) by mouth 2 (two) times daily. 05/27/15   Hope Orlene OchM Neese, NP   BP 102/63 mmHg  Pulse 70  Temp(Src) 98.2 F (36.8 C) (Oral)  Resp 18  Ht 5\' 6"  (1.676 m)  Wt 61.236 kg  BMI 21.80 kg/m2  SpO2 97%  LMP 04/29/2015 Physical Exam  Constitutional: She is oriented to person, place, and time. She appears well-developed and well-nourished. No distress.  HENT:  Head: Normocephalic and atraumatic.  Nose: Right sinus exhibits no maxillary sinus tenderness and no frontal sinus tenderness. Left sinus exhibits no maxillary sinus tenderness and no frontal sinus tenderness.  Mouth/Throat: Uvula is midline, oropharynx is clear and moist and mucous membranes are normal. No posterior oropharyngeal edema or posterior oropharyngeal erythema.  Nasal congestion.   Eyes: Conjunctivae and EOM are normal. Pupils are equal, round, and reactive to light. No scleral icterus.  Neck: Neck supple.  Cardiovascular: Normal rate, regular rhythm and normal heart sounds.  Exam reveals no gallop and no friction rub.   No murmur heard. Pulmonary/Chest: Effort normal and breath sounds normal. No respiratory distress. She has no wheezes. She has no rales.  Abdominal: Soft. There is tenderness. There is no rebound, no guarding and no CVA tenderness.  Soft, tenderness to lower abdomen with palpation.   Genitourinary:  Chaperone present for exam External genitalia without lesions, frothy malodorous d/c vaginal vault, positive CMT, bilateral adnexal tenderness, right >left. Uterus without palpable enlargement.   Musculoskeletal: Normal range of motion.  Lymphadenopathy:    She has no cervical adenopathy.  Neurological: She is alert and oriented to person, place, and time.  Skin: Skin is warm and dry.  Psychiatric: She has a normal mood and affect. Her behavior is normal.  Nursing note and vitals reviewed.   ED Course  Procedures (including critical  care time) DIAGNOSTIC STUDIES: Oxygen Saturation is 100% on RA, nl by my interpretation.    COORDINATION OF CARE: 1:36 PM Discussed treatment plan with pt at bedside which includes pelvic exam and pt agreed to plan.    Labs Review Results for orders placed or performed during the hospital encounter of 05/27/15 (from the past 24 hour(s))  Wet prep, genital     Status: Abnormal   Collection Time: 05/27/15  2:06 PM  Result Value Ref Range   Yeast Wet Prep HPF POC NONE SEEN NONE SEEN   Trich, Wet Prep NONE SEEN NONE SEEN   Clue Cells Wet Prep HPF POC PRESENT (A) NONE SEEN   WBC, Wet Prep HPF POC MANY (A) NONE SEEN   Sperm NONE SEEN   CBC with Differential     Status: Abnormal   Collection Time: 05/27/15  2:46 PM  Result Value Ref Range   WBC 6.3 4.0 - 10.5 K/uL   RBC 5.72 (H) 3.87 - 5.11 MIL/uL   Hemoglobin 14.1 12.0 - 15.0 g/dL   HCT 42.7  36.0 - 46.0 %   MCV 74.7 (L) 78.0 - 100.0 fL   MCH 24.7 (L) 26.0 - 34.0 pg   MCHC 33.0 30.0 - 36.0 g/dL   RDW 40.9 81.1 - 91.4 %   Platelets 357 150 - 400 K/uL   Neutrophils Relative % 57 %   Neutro Abs 3.6 1.7 - 7.7 K/uL   Lymphocytes Relative 29 %   Lymphs Abs 1.9 0.7 - 4.0 K/uL   Monocytes Relative 10 %   Monocytes Absolute 0.6 0.1 - 1.0 K/uL   Eosinophils Relative 4 %   Eosinophils Absolute 0.2 0.0 - 0.7 K/uL   Basophils Relative 0 %   Basophils Absolute 0.0 0.0 - 0.1 K/uL  Comprehensive metabolic panel     Status: Abnormal   Collection Time: 05/27/15  2:46 PM  Result Value Ref Range   Sodium 142 135 - 145 mmol/L   Potassium 3.6 3.5 - 5.1 mmol/L   Chloride 104 101 - 111 mmol/L   CO2 26 22 - 32 mmol/L   Glucose, Bld 102 (H) 65 - 99 mg/dL   BUN 7 6 - 20 mg/dL   Creatinine, Ser 7.82 0.44 - 1.00 mg/dL   Calcium 9.7 8.9 - 95.6 mg/dL   Total Protein 7.2 6.5 - 8.1 g/dL   Albumin 4.0 3.5 - 5.0 g/dL   AST 15 15 - 41 U/L   ALT 7 (L) 14 - 54 U/L   Alkaline Phosphatase 58 38 - 126 U/L   Total Bilirubin 0.4 0.3 - 1.2 mg/dL   GFR calc  non Af Amer >60 >60 mL/min   GFR calc Af Amer >60 >60 mL/min   Anion gap 12 5 - 15  POC urine preg, ED (not at [facility])     Status: None   Collection Time: 05/27/15  2:55 PM  Result Value Ref Range   Preg Test, Ur NEGATIVE NEGATIVE  Urinalysis, Routine w reflex microscopic     Status: Abnormal   Collection Time: 05/27/15  3:03 PM  Result Value Ref Range   Color, Urine YELLOW YELLOW   APPearance CLOUDY (A) CLEAR   Specific Gravity, Urine 1.016 1.005 - 1.030   pH 6.5 5.0 - 8.0   Glucose, UA NEGATIVE NEGATIVE mg/dL   Hgb urine dipstick NEGATIVE NEGATIVE   Bilirubin Urine NEGATIVE NEGATIVE   Ketones, ur NEGATIVE NEGATIVE mg/dL   Protein, ur NEGATIVE NEGATIVE mg/dL   Nitrite POSITIVE (A) NEGATIVE   Leukocytes, UA MODERATE (A) NEGATIVE  Urine microscopic-add on     Status: Abnormal   Collection Time: 05/27/15  3:03 PM  Result Value Ref Range   Squamous Epithelial / LPF 0-5 (A) NONE SEEN   WBC, UA TOO NUMEROUS TO COUNT 0 - 5 WBC/hpf   RBC / HPF 0-5 0 - 5 RBC/hpf   Bacteria, UA MANY (A) NONE SEEN    Imaging Review US Transvaginal Non-ob  05/27/2015  CLINICAL DATA:  Pelvic pain for 2 weeks.  Vaginal discharge. EXAM: TRANSABDOMINAL AND TRANSVAGINAL ULTRASOUND OF PELVIS TECHNIQUE: Both transabdominal and transvaginal ultrasound examinations of the pelvis were performed. Transabdominal technique was performed for global imaging of the pelvis including uterus, ovaries, adnexal regions, and pelvic cul-de-sac. It was necessary to proceed with endovaginal exam following the transabdominal exam to visualize the ovaries and endometrium. COMPARISON:  None FINDINGS: Uterus Measurements: 10.2 x 4.0 x 8.5 cm. Moderate thinning of the myometrium is noted. Endometrium Thickness: 27.5 mm. Markedly thickened endometrium but no intrauterine gestational sac is identified. Right  ovary Measurements: 4.3 x 3.8 x 5.7 cm. Complex septated and likely hemorrhagic cyst measuring 3.1 x 2.8 x 3.1 cm. Left ovary Measurements:  3.7 x 2.2 x 3.5 cm. Multiple follicles. Other findings Trace free pelvic fluid. IMPRESSION: 1. Markedly thickened endometrium. This is more than typically seen with secretory phase of ovulation. Pre implantation pregnancy is possible. Recommend clinical correlation and followup with pregnancy test or vaginal bleeding. 2. Complex 3 cm cyst associated with the right ovary is likely a hemorrhagic cyst. Followup pelvic ultrasound in 3-4 months is suggested. Electronically Signed   By: Rudie Meyer M.D.   On: 05/27/2015 19:16   US Pelvis Complete  05/27/2015  CLINICAL DATA:  Pelvic pain for 2 weeks.  Vaginal discharge. EXAM: TRANSABDOMINAL AND TRANSVAGINAL ULTRASOUND OF PELVIS TECHNIQUE: Both transabdominal and transvaginal ultrasound examinations of the pelvis were performed. Transabdominal technique was performed for global imaging of the pelvis including uterus, ovaries, adnexal regions, and pelvic cul-de-sac. It was necessary to proceed with endovaginal exam following the transabdominal exam to visualize the ovaries and endometrium. COMPARISON:  None FINDINGS: Uterus Measurements: 10.2 x 4.0 x 8.5 cm. Moderate thinning of the myometrium is noted. Endometrium Thickness: 27.5 mm. Markedly thickened endometrium but no intrauterine gestational sac is identified. Right ovary Measurements: 4.3 x 3.8 x 5.7 cm. Complex septated and likely hemorrhagic cyst measuring 3.1 x 2.8 x 3.1 cm. Left ovary Measurements: 3.7 x 2.2 x 3.5 cm. Multiple follicles. Other findings Trace free pelvic fluid. IMPRESSION: 1. Markedly thickened endometrium. This is more than typically seen with secretory phase of ovulation. Pre implantation pregnancy is possible. Recommend clinical correlation and followup with pregnancy test or vaginal bleeding. 2. Complex 3 cm cyst associated with the right ovary is likely a hemorrhagic cyst. Followup pelvic ultrasound in 3-4 months is suggested. Electronically Signed   By: Rudie Meyer M.D.   On: 05/27/2015  19:16   I have personally reviewed and evaluated these lab results as part of my medical decision-making.   MDM  24 y.o. female with pelvic pain, dysuria and vaginal d/c with odor that started 2 weeks ago stable for d/c without fever and does not appear toxic. Will treat for infection and pain. Patient to f/u with GYN. She will go to [facility] for any GYN problems. Discussed with the patient and all questioned fully answered.   Final diagnoses:  UTI (lower urinary tract infection)  Cyst of right ovary  Bacterial vaginosis   I personally performed the services described in this documentation, which was scribed in my presence. The recorded information has been reviewed and is accurate.   [address] [place], NP 05/27/15 2340  Rolland Porter, MD 06/04/15 [phone]

## 2015-05-27 NOTE — ED Notes (Signed)
Patient states having R flank pain x 2 week.   Patient states strong odor for urine and pressure when she urinates.   Patient states also has vaginal discharge.  Patient also complains of cough and sore throat.

## 2015-05-27 NOTE — Discharge Instructions (Signed)
We are treating your urinary tract infection and bacterial vaginosis. You will need to follow up with your GYN doctor. [facility] have increased [facility]

## 2015-05-28 LAB — HIV ANTIBODY (ROUTINE TESTING W REFLEX): HIV Screen 4th Generation wRfx: NONREACTIVE

## 2015-05-28 LAB — GC/CHLAMYDIA PROBE AMP (~~LOC~~) NOT AT ARMC
Chlamydia: NEGATIVE
NEISSERIA GONORRHEA: NEGATIVE

## 2015-05-28 LAB — RPR: RPR Ser Ql: NONREACTIVE

## 2015-08-11 ENCOUNTER — Encounter (HOSPITAL_COMMUNITY): Payer: Self-pay | Admitting: *Deleted

## 2015-08-11 DIAGNOSIS — Z3202 Encounter for pregnancy test, result negative: Secondary | ICD-10-CM | POA: Insufficient documentation

## 2015-08-11 DIAGNOSIS — Z791 Long term (current) use of non-steroidal anti-inflammatories (NSAID): Secondary | ICD-10-CM | POA: Insufficient documentation

## 2015-08-11 DIAGNOSIS — Z862 Personal history of diseases of the blood and blood-forming organs and certain disorders involving the immune mechanism: Secondary | ICD-10-CM | POA: Insufficient documentation

## 2015-08-11 DIAGNOSIS — N760 Acute vaginitis: Secondary | ICD-10-CM | POA: Insufficient documentation

## 2015-08-11 DIAGNOSIS — Z872 Personal history of diseases of the skin and subcutaneous tissue: Secondary | ICD-10-CM | POA: Insufficient documentation

## 2015-08-11 DIAGNOSIS — Z792 Long term (current) use of antibiotics: Secondary | ICD-10-CM | POA: Insufficient documentation

## 2015-08-11 DIAGNOSIS — F17210 Nicotine dependence, cigarettes, uncomplicated: Secondary | ICD-10-CM | POA: Insufficient documentation

## 2015-08-11 DIAGNOSIS — N390 Urinary tract infection, site not specified: Secondary | ICD-10-CM | POA: Insufficient documentation

## 2015-08-11 LAB — URINALYSIS, ROUTINE W REFLEX MICROSCOPIC
BILIRUBIN URINE: NEGATIVE
GLUCOSE, UA: NEGATIVE mg/dL
Hgb urine dipstick: NEGATIVE
KETONES UR: NEGATIVE mg/dL
Nitrite: NEGATIVE
PH: 6.5 (ref 5.0–8.0)
Protein, ur: NEGATIVE mg/dL
SPECIFIC GRAVITY, URINE: 1.014 (ref 1.005–1.030)

## 2015-08-11 LAB — CBC WITH DIFFERENTIAL/PLATELET
Basophils Absolute: 0 10*3/uL (ref 0.0–0.1)
Basophils Relative: 1 %
Eosinophils Absolute: 0.4 10*3/uL (ref 0.0–0.7)
Eosinophils Relative: 6 %
HEMATOCRIT: 39.7 % (ref 36.0–46.0)
Hemoglobin: 12.8 g/dL (ref 12.0–15.0)
LYMPHS ABS: 2.5 10*3/uL (ref 0.7–4.0)
LYMPHS PCT: 39 %
MCH: 24 pg — AB (ref 26.0–34.0)
MCHC: 32.2 g/dL (ref 30.0–36.0)
MCV: 74.3 fL — AB (ref 78.0–100.0)
MONO ABS: 0.6 10*3/uL (ref 0.1–1.0)
MONOS PCT: 9 %
NEUTROS ABS: 2.9 10*3/uL (ref 1.7–7.7)
Neutrophils Relative %: 45 %
Platelets: 311 10*3/uL (ref 150–400)
RBC: 5.34 MIL/uL — ABNORMAL HIGH (ref 3.87–5.11)
RDW: 16.2 % — AB (ref 11.5–15.5)
WBC: 6.3 10*3/uL (ref 4.0–10.5)

## 2015-08-11 LAB — BASIC METABOLIC PANEL
Anion gap: 6 (ref 5–15)
BUN: 7 mg/dL (ref 6–20)
CALCIUM: 9 mg/dL (ref 8.9–10.3)
CHLORIDE: 106 mmol/L (ref 101–111)
CO2: 26 mmol/L (ref 22–32)
CREATININE: 0.91 mg/dL (ref 0.44–1.00)
GFR calc Af Amer: >60 mL/min (ref >60)
GFR calc non Af Amer: >60 mL/min (ref >60)
GLUCOSE: 92 mg/dL (ref 65–99)
Potassium: 3.9 mmol/L (ref 3.5–5.1)
Sodium: 138 mmol/L (ref 135–145)

## 2015-08-11 LAB — URINE MICROSCOPIC-ADD ON

## 2015-08-11 NOTE — ED Notes (Signed)
Patient presents with c/o pain "like I had last time I was diagnosed with ovarian cysts"  approx 1 month ago she was seen for the same

## 2015-08-11 NOTE — ED Notes (Signed)
Patient states she has a vaginal discharge now with a bad smell

## 2015-08-12 ENCOUNTER — Emergency Department (HOSPITAL_COMMUNITY)
Admission: EM | Admit: 2015-08-12 | Discharge: 2015-08-12 | Disposition: A | Discharge status: Home / Self Care | Payer: Self-pay | Attending: Emergency Medicine | Admitting: Emergency Medicine

## 2015-08-12 ENCOUNTER — Emergency Department (HOSPITAL_COMMUNITY): Payer: Self-pay

## 2015-08-12 DIAGNOSIS — N390 Urinary tract infection, site not specified: Secondary | ICD-10-CM

## 2015-08-12 DIAGNOSIS — B9689 Other specified bacterial agents as the cause of diseases classified elsewhere: Secondary | ICD-10-CM

## 2015-08-12 DIAGNOSIS — N760 Acute vaginitis: Secondary | ICD-10-CM

## 2015-08-12 DIAGNOSIS — R102 Pelvic and perineal pain: Secondary | ICD-10-CM

## 2015-08-12 LAB — WET PREP, GENITAL
Sperm: NONE SEEN
Trich, Wet Prep: NONE SEEN
Yeast Wet Prep HPF POC: NONE SEEN

## 2015-08-12 LAB — PREGNANCY, URINE: Preg Test, Ur: NEGATIVE

## 2015-08-12 MED ORDER — ACETAMINOPHEN 325 MG PO TABS
650.0000 mg | ORAL_TABLET | Freq: Once | ORAL | Status: AC
  Administered 2015-08-12: 650 mg via ORAL
  Filled 2015-08-12: qty 2

## 2015-08-12 MED ORDER — FLUCONAZOLE 100 MG PO TABS: 150.0000 mg | ORAL_TABLET | Freq: Once | ORAL | Status: DC

## 2015-08-12 MED ORDER — NAPROXEN 500 MG PO TABS: 500.0000 mg | ORAL_TABLET | Freq: Two times a day (BID) | ORAL | Status: DC

## 2015-08-12 MED ORDER — TRAMADOL HCL 50 MG PO TABS: 50.0000 mg | ORAL_TABLET | Freq: Two times a day (BID) | ORAL | Status: DC | PRN

## 2015-08-12 MED ORDER — CEPHALEXIN 500 MG PO CAPS: 500.0000 mg | ORAL_CAPSULE | Freq: Four times a day (QID) | ORAL | Status: DC

## 2015-08-12 MED ORDER — METRONIDAZOLE 500 MG PO TABS: 500.0000 mg | ORAL_TABLET | Freq: Two times a day (BID) | ORAL | Status: DC

## 2015-08-12 MED ORDER — AZITHROMYCIN 250 MG PO TABS
1000.0000 mg | ORAL_TABLET | Freq: Once | ORAL | Status: AC
  Administered 2015-08-12: 1000 mg via ORAL
  Filled 2015-08-12: qty 4

## 2015-08-12 MED ORDER — CEFTRIAXONE SODIUM 250 MG IJ SOLR
250.0000 mg | Freq: Once | INTRAMUSCULAR | Status: AC
  Administered 2015-08-12: 250 mg via INTRAMUSCULAR
  Filled 2015-08-12: qty 250

## 2015-08-12 MED ORDER — FLUCONAZOLE 150 MG PO TABS: 150.0000 mg | ORAL_TABLET | Freq: Once | ORAL | Status: DC

## 2015-08-12 NOTE — Discharge Instructions (Signed)
STD testing will result in 2 days, you have been treated in the ER for gonorrhea and chlamydia.  If you testing comes back positive you need to have all partners notified so they can be tested and treated, abstain from intercourse until partners are treated. Your Ultrasound was negative and you no longer have any cysts on your ovaries. The testing done with your pelvic exam showed white blood cells and clue cells which means you have not bacterial vaginosis.  This can give you increased vaginal discharge with a foul odor and it is treated with Flagyl. Flagyl has been prescribed for you, do not drink alcohol while taking Flagyl because it will cause severe nausea and vomiting. Take kelfex for possible UTI.  Diflucan was prescribed for you, this will treat you for any yeast infection.  Bacterial Vaginosis Bacterial vaginosis is a vaginal infection that occurs when the normal balance of bacteria in the vagina is disrupted. It results from an overgrowth of certain bacteria. This is the most common vaginal infection in women of childbearing age. Treatment is important to prevent complications, especially in pregnant women, as it can cause a premature delivery. CAUSES  Bacterial vaginosis is caused by an increase in harmful bacteria that are normally present in smaller amounts in the vagina. Several different kinds of bacteria can cause bacterial vaginosis. However, the reason that the condition develops is not fully understood. RISK FACTORS Certain activities or behaviors can put you at an increased risk of developing bacterial vaginosis, including:  Having a new sex partner or multiple sex partners.  Douching.  Using an intrauterine device (IUD) for contraception. Women do not get bacterial vaginosis from toilet seats, bedding, swimming pools, or contact with objects around them. SIGNS AND SYMPTOMS  Some women with bacterial vaginosis have no signs or symptoms. Common symptoms include:  Grey vaginal  discharge.  A fishlike odor with discharge, especially after sexual intercourse.  Itching or burning of the vagina and vulva.  Burning or pain with urination. DIAGNOSIS  Your health care provider will take a medical history and examine the vagina for signs of bacterial vaginosis. A sample of vaginal fluid may be taken. Your health care provider will look at this sample under a microscope to check for bacteria and abnormal cells. A vaginal pH test may also be done.  TREATMENT  Bacterial vaginosis may be treated with antibiotic medicines. These may be given in the form of a pill or a vaginal cream. A second round of antibiotics may be prescribed if the condition comes back after treatment. Because bacterial vaginosis increases your risk for sexually transmitted diseases, getting treated can help reduce your risk for chlamydia, gonorrhea, HIV, and herpes. HOME CARE INSTRUCTIONS   Only take over-the-counter or prescription medicines as directed by your health care provider.  If antibiotic medicine was prescribed, take it as directed. Make sure you finish it even if you start to feel better.  Tell all sexual partners that you have a vaginal infection. They should see their health care provider and be treated if they have problems, such as a mild rash or itching.  During treatment, it is important that you follow these instructions:  Avoid sexual activity or use condoms correctly.  Do not douche.  Avoid alcohol as directed by your health care provider.  Avoid breastfeeding as directed by your health care provider. SEEK MEDICAL CARE IF:   Your symptoms are not improving after 3 days of treatment.  You have increased discharge or pain.  You have a fever. MAKE SURE YOU:   Understand these instructions.  Will watch your condition.  Will get help right away if you are not doing well or get worse. FOR MORE INFORMATION  Centers for Disease Control and Prevention, Division of STD  Prevention: [employer] American Sexual Health Association (ASHA): www.ashastd.org    This information is not intended to replace advice given to you by your health care provider. Make sure you discuss any questions you have with your health care provider.   Document Released: 02/22/2005 Document Revised: 03/15/2014 Document Reviewed: 10/04/2012 Elsevier Interactive Patient Education 2016 Else[employer] Inc.  Dysuria Dysuria is pain or discomfort while urinating. The pain or discomfort may be felt in the tube that carries urine out of the bladder (urethra) or in the surrounding tissue of the genitals. The pain may also be felt in the groin area, lower abdomen, and lower back. You may have to urinate frequently or have the sudden feeling that you have to urinate (urgency). Dysuria can affect both men and women, but is more common in women. Dysuria can be caused by many different things, including:  Urinary tract infection in women.  Infection of the kidney or bladder.  Kidney stones or bladder stones.  Certain sexually transmitted infections (STIs), such as chlamydia.  Dehydration.  Inflammation of the vagina.  Use of certain medicines.  Use of certain soaps or scented products that cause irritation. HOME CARE INSTRUCTIONS Watch your dysuria for any changes. The following actions may help to reduce any discomfort you are feeling:  Drink enough fluid to keep your urine clear or pale yellow.  Empty your bladder often. Avoid holding urine for long periods of time.  After a bowel movement or urination, women should cleanse from front to back, using each tissue only once.  Empty your bladder after sexual intercourse.  Take medicines only as directed by your health care provider.  If you were prescribed an antibiotic medicine, finish it all even if you start to feel better.  Avoid caffeine, tea, and alcohol. They can irritate the bladder and make dysuria worse. In men, alcohol may  irritate the prostate.  Keep all follow-up visits as directed by your health care provider. This is important.  If you had any tests done to find the cause of dysuria, it is your responsibility to obtain your test results. Ask the lab or department performing the test when and how you will get your results. Talk with your health care provider if you have any questions about your results. SEEK MEDICAL CARE IF:  You develop pain in your back or sides.  You have a fever.  You have nausea or vomiting.  You have blood in your urine.  You are not urinating as often as you usually do. SEEK IMMEDIATE MEDICAL CARE IF:  You pain is severe and not relieved with medicines.  You are unable to hold down any fluids.  You or someone else notices a change in your mental function.  You have a rapid heartbeat at rest.  You have shaking or chills.  You feel extremely weak.   This information is not intended to replace advice given to you by your health care provider. Make sure you discuss any questions you have with your health care provider.   Document Released: 11/21/2003 Document Revised: 03/15/2014 Document Reviewed: 10/18/2013 Elsevier Interactive Patient Education [employer]

## 2015-08-12 NOTE — ED Provider Notes (Signed)
CSN: [id number]     Arrival date & time 08/11/15  2033 History   First MD Initiated Contact with Patient 08/12/15 0015     Chief Complaint  Patient presents with  . Ovarian pain    HPI  Evelyn [name] is an 25 y.o. female who presents to the ED for evaluation of right flank pain, urinary symptoms, and vaginal discharge. She states she has had right flank pain for the past week. She states it was of gradual onset, is constant, aching pain. She reports associated increased urinary frequency and strong urinary odor. Denies dysuria or gross hematuria. She also reports increased vaginal discharge from baseline with foul odor. Endorses one episode of NBNB emesis last week but none since then. She states she is sexually active with one partner with intermittent protection use. She does not think she is pregnant. She was seen in the ED in 05/2015 for similar symptoms and was diagnosed with a cyst at that time. She has not followed up with OBGYN since then. Denies fever, chills, feeling faint/lightheaded. She otherwise has not tried anything to alleviate her symptoms.  Past Medical History  Diagnosis Date  . Eczema   . Anemia   . Sickle cell trait (HCC)   . Prior pregnancy with fetal demise    Past Surgical History  Procedure Laterality Date  . Dilation and curettage of uterus     Family History  Problem Relation Age of Onset  . Hypertension Father    Social History  Substance Use Topics  . Smoking status: Current Every Day Smoker -- 1.00 packs/day    Types: Cigarettes  . Smokeless tobacco: Never Used  . Alcohol Use: No   OB History    Gravida Para Term Preterm AB TAB SAB Ectopic Multiple Living   5 4 4  1 1    0 3     Review of Systems  All other systems reviewed and are negative.     Allergies  Review of patient's allergies indicates no known allergies.  Home Medications   Prior to Admission medications   Medication Sig Start Date End Date Taking? Authorizing Provider   cephALEXin (KEFLEX) 500 MG capsule Take 1 capsule (500 mg total) by mouth 3 (three) times daily. 05/27/15   Hope Orlene OchM Neese, NP  HYDROcodone-acetaminophen (NORCO) 5-325 MG tablet Take 1 tablet by mouth every 6 (six) hours as needed. 05/27/15   Hope Orlene OchM Neese, NP  metroNIDAZOLE (FLAGYL) 500 MG tablet Take 1 tablet (500 mg total) by mouth 2 (two) times daily. 05/27/15   Hope Orlene OchM Neese, NP  naproxen (NAPROSYN) 500 MG tablet Take 1 tablet (500 mg total) by mouth 2 (two) times daily. 05/27/15   Hope Orlene OchM Neese, NP   BP 119/67 mmHg  Pulse 56  Temp(Src) 98.2 F (36.8 C) (Oral)  Resp 12  Ht 5\' 7"  (1.702 m)  Wt 63.957 kg  BMI 22.08 kg/m2  SpO2 100%  LMP 07/30/2015 Physical Exam  Constitutional: She is oriented to person, place, and time.  HENT:  Right Ear: External ear normal.  Left Ear: External ear normal.  Nose: Nose normal.  Mouth/Throat: Oropharynx is clear and moist.  Eyes: Conjunctivae and EOM are normal. Pupils are equal, round, and reactive to light.  Neck: Normal range of motion. Neck supple.  Cardiovascular: Normal rate, regular rhythm, normal heart sounds and intact distal pulses.   Pulmonary/Chest: Effort normal and breath sounds normal. No respiratory distress. She has no wheezes. She exhibits no tenderness.  Abdominal:  Soft. Bowel sounds are normal. She exhibits no distension. There is no tenderness. There is no rebound and no guarding.  +R CVA tenderness  Genitourinary:  Thin, watery yellow green discharge in vault on exam Foul odor Cervix non friable, no CMT + R adnexal tenderness  Musculoskeletal: She exhibits no edema.  Neurological: She is alert and oriented to person, place, and time. No cranial nerve deficit.  Skin: Skin is warm and dry.  Psychiatric: She has a normal mood and affect.  Nursing note and vitals reviewed.   ED Course  Procedures (including critical care time) Labs Review Labs Reviewed  CBC WITH DIFFERENTIAL/PLATELET - Abnormal; Notable for the following:     RBC 5.34 (*)    MCV 74.3 (*)    MCH 24.0 (*)    RDW 16.2 (*)    All other components within normal limits  URINALYSIS, ROUTINE W REFLEX MICROSCOPIC (NOT AT [facility]) - Abnormal; Notable for the following:    Color, Urine STRAW (*)    APPearance CLOUDY (*)    Leukocytes, UA SMALL (*)    All other components within normal limits  URINE MICROSCOPIC-ADD ON - Abnormal; Notable for the following:    Squamous Epithelial / LPF 6-30 (*)    Bacteria, UA MANY (*)    All other components within normal limits  WET PREP, GENITAL  URINE CULTURE  BASIC METABOLIC PANEL  PREGNANCY, URINE  POC URINE PREG, ED  GC/CHLAMYDIA PROBE AMP ([place]) NOT AT [facility]    Imaging Review No results found. I have personally reviewed and evaluated these images and lab results as part of my medical decision-making.   EKG Interpretation None      MDM   Final diagnoses:  Right adnexal tenderness    Initial labs reveal a UA concerning for UTI with small leuks and many bacteria. There is evidence of contamination but given pt's symptoms will plan to treat as true infection with keflex and send for culture. Urine preg is pending. Labs otherwise unremarkable. Will perform pelvic exam and reassess.  On pelvic exam pt does have discharge. Given sexual activity with intermittent protection will treat empirically with rocephin and azithromycin. Given right adnexal tenderness with history of right hemorrhagic cyst will obtain US today to r/o torsion or other emergent pelvic etiology though i suspect pt's symptoms most likely related to UTI. At end of shift [place] is pending. Case discussed with Evelyn Berry, PA-C. If [place] negative for acute findings will plan to d/c home with course of keflex and instructions for OB/GYN f/u.   Evelyn Coria, PA-C 08/12/15 0111  Evelyn Crumble, MD 08/12/15 [phone]

## 2015-08-13 LAB — GC/CHLAMYDIA PROBE AMP (~~LOC~~) NOT AT ARMC
Chlamydia: NEGATIVE
NEISSERIA GONORRHEA: NEGATIVE

## 2015-08-14 LAB — URINE CULTURE

## 2015-08-15 ENCOUNTER — Telehealth: Payer: Self-pay | Admitting: *Deleted

## 2015-08-15 NOTE — ED Notes (Signed)
Post ED Visit - Positive Culture Follow-up  Culture report reviewed by antimicrobial stewardship pharmacist:  []  Enzo BiNathan Batchelder, Pharm.D. []  Celedonio MiyamotoJeremy Frens, Pharm.D., BCPS []  Garvin FilaMike Maccia, Pharm.D. []  Georgina PillionElizabeth Martin, [address]Ph[address]D., BCPS []  [place]h Pham, [place]arm[place]D., BCPS, AAHIVP []  Estella HuskMichelle Turner, Pharm.D., BCPS, AAHIVP [x]  Tennis Mustassie Stewart, Pharm.D. []  Rob Oswaldo DoneVincent, [address]  Positive urine culture Treated with Cephalexin, organism sensitive to the same and no further patient follow-up is required at this time.  Virl AxeRobertson, Jian Hodgman [facility] Cent[facility] 08/15/2015, 2:49 PM

## 2016-03-05 ENCOUNTER — Inpatient Hospital Stay (HOSPITAL_COMMUNITY)
Admission: AD | Admit: 2016-03-05 | Discharge: 2016-03-05 | Disposition: A | Discharge status: Home / Self Care | Payer: Medicaid Other | Source: Ambulatory Visit | Attending: Obstetrics and Gynecology | Admitting: Obstetrics and Gynecology

## 2016-03-05 DIAGNOSIS — N760 Acute vaginitis: Secondary | ICD-10-CM

## 2016-03-05 DIAGNOSIS — B9689 Other specified bacterial agents as the cause of diseases classified elsewhere: Secondary | ICD-10-CM | POA: Diagnosis not present

## 2016-03-05 DIAGNOSIS — N920 Excessive and frequent menstruation with regular cycle: Secondary | ICD-10-CM

## 2016-03-05 DIAGNOSIS — M7989 Other specified soft tissue disorders: Secondary | ICD-10-CM | POA: Insufficient documentation

## 2016-03-05 DIAGNOSIS — N939 Abnormal uterine and vaginal bleeding, unspecified: Secondary | ICD-10-CM | POA: Diagnosis present

## 2016-03-05 DIAGNOSIS — D573 Sickle-cell trait: Secondary | ICD-10-CM | POA: Insufficient documentation

## 2016-03-05 DIAGNOSIS — F17210 Nicotine dependence, cigarettes, uncomplicated: Secondary | ICD-10-CM | POA: Insufficient documentation

## 2016-03-05 DIAGNOSIS — R51 Headache: Secondary | ICD-10-CM | POA: Diagnosis not present

## 2016-03-05 LAB — URINALYSIS, ROUTINE W REFLEX MICROSCOPIC
Bilirubin Urine: NEGATIVE
GLUCOSE, UA: NEGATIVE mg/dL
KETONES UR: NEGATIVE mg/dL
Leukocytes, UA: NEGATIVE
Nitrite: NEGATIVE
PROTEIN: 30 mg/dL — AB
Specific Gravity, Urine: 1.016 (ref 1.005–1.030)
pH: 5 (ref 5.0–8.0)

## 2016-03-05 LAB — WET PREP, GENITAL
Sperm: NONE SEEN
TRICH WET PREP: NONE SEEN
Yeast Wet Prep HPF POC: NONE SEEN

## 2016-03-05 LAB — CBC
HEMATOCRIT: 42.4 % (ref 36.0–46.0)
Hemoglobin: 14.4 g/dL (ref 12.0–15.0)
MCH: 25.1 pg — ABNORMAL LOW (ref 26.0–34.0)
MCHC: 34 g/dL (ref 30.0–36.0)
MCV: 74 fL — AB (ref 78.0–100.0)
Platelets: 347 10*3/uL (ref 150–400)
RBC: 5.73 MIL/uL — AB (ref 3.87–5.11)
RDW: 15.8 % — AB (ref 11.5–15.5)
WBC: 5.6 10*3/uL (ref 4.0–10.5)

## 2016-03-05 LAB — POCT PREGNANCY, URINE: PREG TEST UR: NEGATIVE

## 2016-03-05 MED ORDER — METRONIDAZOLE 500 MG PO TABS: 500.0000 mg | ORAL_TABLET | Freq: Two times a day (BID) | ORAL | 0 refills | Status: DC

## 2016-03-05 MED ORDER — NAPROXEN 500 MG PO TABS: 500.0000 mg | ORAL_TABLET | Freq: Two times a day (BID) | ORAL | 0 refills | Status: DC

## 2016-03-05 NOTE — MAU Note (Signed)
Pt reports she started haivng swelling in her feet and legs on 12/23.  Swelling was followed by headache and back pain "like her menstrual cycle was going to start" She started bleeding on 12/26. Bleeding has been heavy and cramping has been very bad. Pt has not taken any medication for the headache or cramps. Cramps are more in her legs and feet today. abd cramping  subsided.

## 2016-03-05 NOTE — MAU Provider Note (Signed)
History     CSN: [id number]  Arrival date and time: 03/05/16 1615   None     Chief Complaint  Patient presents with  . Vaginal Bleeding  . Leg Swelling   Non-pregnant female here with leg swelling, heavy vaginal bleeding, and HA. The swelling started 6 days ago and is not a new sympom, it's usually worse when she's on her feet. She also reports her feet aching. She reports her menses started 3 days ago and is heavier than usual. She is changing pads most of the time q1 hr and they are saturated. She reports menses are usually not heavy. HA started 2 days ago. She describes as located in occipital area but somewhat diffuse all over. She has not taken anything for the HA. She reports malodorous vaginal discharge prior to the VB. No new partner in 7 years.   Pertinent Gynecological History: Menses: 03/02/16 Contraception: none Sexually transmitted diseases: no past history  Past Medical History:  Diagnosis Date  . Anemia   . Eczema   . Prior pregnancy with fetal demise   . Sickle cell trait [facility](HCC)     Past Surgical History:  Procedure Laterality Date  . DILATION AND CURETTAGE OF UTERUS      Family History  Problem Relation Age of Onset  . Hypertension Father     Social History  Substance Use Topics  . Smoking status: Current Every Day Smoker    Packs/day: 1.00    Types: Cigarettes  . Smokeless tobacco: Never Used  . Alcohol use No    Allergies: No Known Allergies  Prescriptions Prior to Admission  Medication Sig Dispense Refill Last Dose  . cephALEXin (KEFLEX) 500 MG capsule Take 1 capsule (500 mg total) by mouth 4 (four) times daily. (Patient not taking: Reported on 03/05/2016) 28 capsule 0 Not Taking at Unknown time  . fluconazole (DIFLUCAN) 150 MG tablet Take 1 tablet (150 mg total) by mouth once. (Patient not taking: Reported on 03/05/2016) 1 tablet 0 Not Taking at Unknown time  . HYDROcodone-acetaminophen (NORCO) 5-325 MG tablet Take 1 tablet by mouth every 6  (six) hours as needed. (Patient not taking: Reported on 03/05/2016) 10 tablet 0 Not Taking at Unknown time  . metroNIDAZOLE (FLAGYL) 500 MG tablet Take 1 tablet (500 mg total) by mouth 2 (two) times daily. (Patient not taking: Reported on 03/05/2016) 14 tablet 0 Not Taking at Unknown time  . naproxen (NAPROSYN) 500 MG tablet Take 1 tablet (500 mg total) by mouth 2 (two) times daily with a meal. (Patient not taking: Reported on 03/05/2016) 30 tablet 0 Not Taking at Unknown time  . traMADol (ULTRAM) 50 MG tablet Take 1 tablet (50 mg total) by mouth every 12 (twelve) hours as needed for severe pain. (Patient not taking: Reported on 03/05/2016) 6 tablet 0 Not Taking at Unknown time    Review of Systems  Constitutional: Negative.   Gastrointestinal: Negative.   Neurological: Positive for dizziness and headaches.   Physical Exam   Blood pressure 116/61, pulse 76, temperature 98.3 F (36.8 C), resp. rate 18, last menstrual period 03/02/2016, not currently breastfeeding.  Physical Exam  Constitutional: She is oriented to person, place, and time. She appears well-developed and well-nourished. No distress.  HENT:  Head: Normocephalic and atraumatic.  Neck: Normal range of motion. Neck supple.  Cardiovascular: Normal rate.   Respiratory: Effort normal.  GI: Soft. She exhibits no distension and no mass. There is no tenderness. There is no rebound and no guarding.  Genitourinary:  Genitourinary Comments: External: no lesions or erythema Vagina: rugated, parous, moderate drk bloody discharge Uterus: non enlarged, anteverted, non tender, no CMT Adnexae: no masses, no tenderness left, no tenderness right   Musculoskeletal: Normal range of motion. She exhibits no edema, tenderness or deformity.       Right lower leg: She exhibits no edema.       Left lower leg: Normal. She exhibits no edema.       Right foot: Normal. There is no swelling.       Left foot: Normal. There is no swelling.   Neurological: She is alert and oriented to person, place, and time.  Skin: Skin is warm and dry.  Psychiatric: She has a normal mood and affect.   Results for orders placed or performed during the hospital encounter of 03/05/16 (from the past 24 hour(s))  Urinalysis, Routine w reflex microscopic     Status: Abnormal   Collection Time: 03/05/16  4:55 PM  Result Value Ref Range   Color, Urine AMBER (A) YELLOW   APPearance HAZY (A) CLEAR   Specific Gravity, Urine 1.016 1.005 - 1.030   pH 5.0 5.0 - 8.0   Glucose, UA NEGATIVE NEGATIVE mg/dL   Hgb urine dipstick LARGE (A) NEGATIVE   Bilirubin Urine NEGATIVE NEGATIVE   Ketones, ur NEGATIVE NEGATIVE mg/dL   Protein, ur 30 (A) NEGATIVE mg/dL   Nitrite NEGATIVE NEGATIVE   Leukocytes, UA NEGATIVE NEGATIVE   RBC / HPF 6-30 0 - 5 RBC/hpf   WBC, UA 6-30 0 - 5 WBC/hpf   Bacteria, UA FEW (A) NONE SEEN   Squamous Epithelial / LPF 6-30 (A) NONE SEEN   Mucous PRESENT   Pregnancy, urine POC     Status: None   Collection Time: 03/05/16  5:10 PM  Result Value Ref Range   Preg Test, Ur NEGATIVE NEGATIVE  Wet prep, genital     Status: Abnormal   Collection Time: 03/05/16  5:31 PM  Result Value Ref Range   Yeast Wet Prep HPF POC NONE SEEN NONE SEEN   Trich, Wet Prep NONE SEEN NONE SEEN   Clue Cells Wet Prep HPF POC PRESENT (A) NONE SEEN   WBC, Wet Prep HPF POC MODERATE (A) NONE SEEN   Sperm NONE SEEN   CBC     Status: Abnormal   Collection Time: 03/05/16  5:42 PM  Result Value Ref Range   WBC 5.6 4.0 - 10.5 K/uL   RBC 5.73 (H) 3.87 - 5.11 MIL/uL   Hemoglobin 14.4 12.0 - 15.0 g/dL   HCT 16.1 09.6 - 04.5 %   MCV 74.0 (L) 78.0 - 100.0 fL   MCH 25.1 (L) 26.0 - 34.0 pg   MCHC 34.0 30.0 - 36.0 g/dL   RDW 40.9 (H) 81.1 - 91.4 %   Platelets 347 150 - 400 K/uL    MAU Course  Procedures  MDM Labs ordered and reviewed. No evidence of anemia. No evidence of edema or DVT. Will treat menorrhagia with Naproxen. Tylenol prn HA. Stable for discharge  home.  Assessment and Plan   1. Menorrhagia with regular cycle   2. Bacterial vaginosis    Discharge home Follow up in [facility] as needed Tylenol prn HA Return for worsening sx  Allergies as of 03/05/2016   No Known Allergies     Medication List    STOP taking these medications   cephALEXin 500 MG capsule Commonly known as:  KEFLEX   fluconazole 150 MG tablet  Commonly known as:  DIFLUCAN   HYDROcodone-acetaminophen 5-325 MG tablet Commonly known as:  NORCO   traMADol 50 MG tablet Commonly known as:  ULTRAM     TAKE these medications   metroNIDAZOLE 500 MG tablet Commonly known as:  FLAGYL Take 1 tablet (500 mg total) by mouth 2 (two) times daily.   naproxen 500 MG tablet Commonly known as:  NAPROSYN Take 1 tablet (500 mg total) by mouth 2 (two) times daily with a meal.      Donette LarryMelanie Trevaughn Schear, CNM 03/05/2016, 5:23 PM

## 2016-03-05 NOTE — Discharge Instructions (Signed)
Bacterial Vaginosis Bacterial vaginosis is an infection of the vagina. It happens when too many germs (bacteria) grow in the vagina. This infection puts you at risk for infections from sex (STIs). Treating this infection can lower your risk for some STIs. You should also treat this if you are pregnant. It can cause your baby to be born early. Follow these instructions at home: Medicines  Take over-the-counter and prescription medicines only as told by your doctor.  Take or use your antibiotic medicine as told by your doctor. Do not stop taking or using it even if you start to feel better. General instructions  If you your sexual partner is a woman, tell her that you have this infection. She needs to get treatment if she has symptoms. If you have a female partner, he does not need to be treated.  During treatment:  Avoid sex.  Do not douche.  Avoid alcohol as told.  Avoid breastfeeding as told.  Drink enough fluid to keep your pee (urine) clear or pale yellow.  Keep your vagina and butt (rectum) clean.  Wash the area with warm water every day.  Wipe from front to back after you use the toilet.  Keep all follow-up visits as told by your doctor. This is important. Preventing this condition  Do not douche.  Use only warm water to wash around your vagina.  Use protection when you have sex. This includes:  Latex condoms.  Dental dams.  Limit how many people you have sex with. It is best to only have sex with the same person (be monogamous).  Get tested for STIs. Have your partner get tested.  Wear underwear that is cotton or lined with cotton.  Avoid tight pants and pantyhose. This is most important in summer.  Do not use any products that have nicotine or tobacco in them. These include cigarettes and e-cigarettes. If you need help quitting, ask your doctor.  Do not use illegal drugs.  Limit how much alcohol you drink. Contact a doctor if:  Your symptoms do not get  better, even after you are treated.  You have more discharge or pain when you pee (urinate).  You have a fever.  You have pain in your belly (abdomen).  You have pain with sex.  Your bleed from your vagina between periods. Summary  This infection happens when too many germs (bacteria) grow in the vagina.  Treating this condition can lower your risk for some infections from sex (STIs).  You should also treat this if you are pregnant. It can cause early (premature) birth.  Do not stop taking or using your antibiotic medicine even if you start to feel better. This information is not intended to replace advice given to you by your health care provider. Make sure you discuss any questions you have with your health care provider. Document Released: 12/02/2007 Document Revised: 11/08/2015 Document Reviewed: 11/08/2015 Elsevier Interactive Patient Education  2017 Elsevier Inc.  Menorrhagia Menorrhagia is when your menstrual periods are heavy or last longer than usual. Follow these instructions at home:  Only take medicine as told by your doctor.  Take any iron pills as told by your doctor. Heavy bleeding may cause low levels of iron in your body.  Do not take aspirin 1 week before or during your period. Aspirin can make the bleeding worse.  Lie down for a while if you change your tampon or pad more than once in 2 hours. This may help lessen the bleeding.  Eat  a healthy diet and foods with iron. These foods include leafy green vegetables, meat, liver, eggs, and whole grain breads and cereals.  Do not try to lose weight. Wait until the heavy bleeding has stopped and your iron level is normal. Contact a doctor if:  You soak through a pad or tampon every 1 or 2 hours, and this happens every time you have a period.  You need to use pads and tampons at the same time because you are bleeding so much.  You need to change your pad or tampon during the night.  You have a period that  lasts for more than 8 days.  You pass clots bigger than 1 inch (2.5 cm) wide.  You have irregular periods that happen more or less often than once a month.  You feel dizzy or pass out (faint).  You feel very weak or tired.  You feel short of breath or feel your heart is beating too fast when you exercise.  You feel sick to your stomach (nausea) and you throw up (vomit) while you are taking your medicine.  You have watery poop (diarrhea) while you are taking your medicine.  You have any problems that may be related to the medicine you are taking. Get help right away if:  You soak through 4 or more pads or tampons in 2 hours.  You have any bleeding while you are pregnant. This information is not intended to replace advice given to you by your health care provider. Make sure you discuss any questions you have with your health care provider. Document Released: 12/02/2007 Document Revised: 07/31/2015 Document Reviewed: 08/24/2012 Elsevier Interactive Patient Education  2017 [employer]

## 2016-03-09 LAB — GC/CHLAMYDIA PROBE AMP (~~LOC~~) NOT AT ARMC
CHLAMYDIA, DNA PROBE: NEGATIVE
NEISSERIA GONORRHEA: NEGATIVE

## 2016-04-23 ENCOUNTER — Inpatient Hospital Stay (HOSPITAL_COMMUNITY)
Admission: AD | Admit: 2016-04-23 | Discharge: 2016-04-23 | Disposition: A | Discharge status: Home / Self Care | Payer: Medicaid Other | Source: Ambulatory Visit | Attending: Family Medicine | Admitting: Family Medicine

## 2016-04-23 ENCOUNTER — Encounter (HOSPITAL_COMMUNITY): Payer: Self-pay

## 2016-04-23 DIAGNOSIS — B9689 Other specified bacterial agents as the cause of diseases classified elsewhere: Secondary | ICD-10-CM

## 2016-04-23 DIAGNOSIS — S39012A Strain of muscle, fascia and tendon of lower back, initial encounter: Secondary | ICD-10-CM

## 2016-04-23 DIAGNOSIS — Z8249 Family history of ischemic heart disease and other diseases of the circulatory system: Secondary | ICD-10-CM | POA: Insufficient documentation

## 2016-04-23 DIAGNOSIS — N760 Acute vaginitis: Secondary | ICD-10-CM | POA: Diagnosis not present

## 2016-04-23 DIAGNOSIS — F17210 Nicotine dependence, cigarettes, uncomplicated: Secondary | ICD-10-CM | POA: Insufficient documentation

## 2016-04-23 DIAGNOSIS — M549 Dorsalgia, unspecified: Secondary | ICD-10-CM | POA: Diagnosis present

## 2016-04-23 DIAGNOSIS — R109 Unspecified abdominal pain: Secondary | ICD-10-CM | POA: Diagnosis not present

## 2016-04-23 DIAGNOSIS — D573 Sickle-cell trait: Secondary | ICD-10-CM | POA: Insufficient documentation

## 2016-04-23 LAB — POCT PREGNANCY, URINE: PREG TEST UR: NEGATIVE

## 2016-04-23 LAB — URINALYSIS, ROUTINE W REFLEX MICROSCOPIC
Bilirubin Urine: NEGATIVE
Glucose, UA: NEGATIVE mg/dL
Hgb urine dipstick: NEGATIVE
KETONES UR: NEGATIVE mg/dL
Leukocytes, UA: NEGATIVE
Nitrite: NEGATIVE
Protein, ur: NEGATIVE mg/dL
Specific Gravity, Urine: 1.012 (ref 1.005–1.030)
pH: 5 (ref 5.0–8.0)

## 2016-04-23 MED ORDER — METRONIDAZOLE 500 MG PO TABS: 500.0000 mg | ORAL_TABLET | Freq: Two times a day (BID) | ORAL | 0 refills | Status: DC

## 2016-04-23 MED ORDER — NAPROXEN 500 MG PO TABS: 500.0000 mg | ORAL_TABLET | Freq: Two times a day (BID) | ORAL | 0 refills | Status: DC

## 2016-04-23 NOTE — MAU Provider Note (Signed)
History     CSN: [id number]  Arrival date and time: 04/23/16 4098   First Provider Initiated Contact with Patient 04/23/16 [phone]      Chief Complaint  Patient presents with  . Back Pain  . Abdominal Pain   HPI [name] is a 26 y.o. [id number] female who presents to MAU today with complaint of back pain and abdominal pain. The patient was diagnosed with BV in December and never picked up Rx for treatment. She continues to have a foul smelling white discharge. She denies vaginal bleeding, UTI symptoms, fever, N/V/D or constipation. LMP was at that end of January and normal. She rates her pain at 6/10 now and has not taken anything for pain.   OB History    Gravida Para Term Preterm AB Living   5 4 4   1 3    SAB TAB Ectopic Multiple Live Births     1   0 4      Past Medical History:  Diagnosis Date  . Anemia   . Eczema   . Prior pregnancy with fetal demise   . Sickle cell trait [facility])     Past Surgical History:  Procedure Laterality Date  . DILATION AND CURETTAGE OF UTERUS      Family History  Problem Relation Age of Onset  . Hypertension Father     Social History  Substance Use Topics  . Smoking status: Current Every Day Smoker    Packs/day: 1.00    Types: Cigarettes  . Smokeless tobacco: Never Used  . Alcohol use No    Allergies: No Known Allergies  Prescriptions Prior to Admission  Medication Sig Dispense Refill Last Dose  . [DISCONTINUED] metroNIDAZOLE (FLAGYL) 500 MG tablet Take 1 tablet (500 mg total) by mouth 2 (two) times daily. (Patient not taking: Reported on 04/23/2016) 14 tablet 0 Not Taking at Unknown time  . [DISCONTINUED] naproxen (NAPROSYN) 500 MG tablet Take 1 tablet (500 mg total) by mouth 2 (two) times daily with a meal. (Patient not taking: Reported on 04/23/2016) 8 tablet 0 Not Taking at Unknown time    Review of Systems  Constitutional: Negative for fever.  Gastrointestinal: Positive for abdominal pain. Negative for constipation,  diarrhea, nausea and vomiting.  Genitourinary: Positive for vaginal discharge. Negative for dysuria, flank pain, frequency, urgency and vaginal bleeding.  Musculoskeletal: Positive for back pain.   Physical Exam   Blood pressure 107/56, pulse (!) 54, temperature 98.2 F (36.8 C), temperature source Oral, resp. rate 18, height 5\' 8"  (1.727 m), weight 140 lb (63.5 kg), last menstrual period 03/27/2016, SpO2 99 %, not currently breastfeeding.  Physical Exam  Nursing note and vitals reviewed. Constitutional: She is oriented to person, place, and time. She appears well-developed and well-nourished. No distress.  HENT:  Head: Normocephalic and atraumatic.  Cardiovascular: Bradycardia present.   Respiratory: Effort normal.  GI: Soft. She exhibits no distension and no mass. There is no tenderness. There is no rebound and no guarding.  Neurological: She is alert and oriented to person, place, and time.  Skin: Skin is warm and dry. No erythema.  Psychiatric: She has a normal mood and affect.    Results for orders placed or performed during the hospital encounter of 04/23/16 (from the past 24 hour(s))  Urinalysis, Routine w reflex microscopic     Status: Abnormal   Collection Time: 04/23/16  9:00 AM  Result Value Ref Range   Color, Urine YELLOW YELLOW   APPearance HAZY (A) CLEAR  Specific Gravity, Urine 1.012 1.005 - 1.030   pH 5.0 5.0 - 8.0   Glucose, UA NEGATIVE NEGATIVE mg/dL   Hgb urine dipstick NEGATIVE NEGATIVE   Bilirubin Urine NEGATIVE NEGATIVE   Ketones, ur NEGATIVE NEGATIVE mg/dL   Protein, ur NEGATIVE NEGATIVE mg/dL   Nitrite NEGATIVE NEGATIVE   Leukocytes, UA NEGATIVE NEGATIVE  Pregnancy, urine POC     Status: None   Collection Time: 04/23/16  9:08 AM  Result Value Ref Range   Preg Test, Ur NEGATIVE NEGATIVE    MAU Course  Procedures None  MDM UPT - negative UA today  Will not repeat wet prep or GC/Chlamydia today as patient's symptoms have not changed and she  never filled Rx for treatment. Patient denies new sexual partners since last visit.   Assessment and Plan  A: Bacterial vaginosis Musculoskeletal back pain   P:  Discharge home Rx for Flagyl and Naproxen given to patient  Warning signs for worsening condition discussed Patient advised to follow-up with [facility] if symptoms persist or worsen Patient may return to MAU as needed or if her condition were to change or worsen  Marny LowensteinJulie N Teryn Gust, PA-C  04/23/2016, 10:10 AM

## 2016-04-23 NOTE — Discharge Instructions (Signed)
Bacterial Vaginosis Bacterial vaginosis is an infection of the vagina. It happens when too many germs (bacteria) grow in the vagina. This infection puts you at risk for infections from sex (STIs). Treating this infection can lower your risk for some STIs. You should also treat this if you are pregnant. It can cause your baby to be born early. Follow these instructions at home: Medicines  Take over-the-counter and prescription medicines only as told by your doctor.  Take or use your antibiotic medicine as told by your doctor. Do not stop taking or using it even if you start to feel better. General instructions  If you your sexual partner is a woman, tell her that you have this infection. She needs to get treatment if she has symptoms. If you have a female partner, he does not need to be treated.  During treatment:  Avoid sex.  Do not douche.  Avoid alcohol as told.  Avoid breastfeeding as told.  Drink enough fluid to keep your pee (urine) clear or pale yellow.  Keep your vagina and butt (rectum) clean.  Wash the area with warm water every day.  Wipe from front to back after you use the toilet.  Keep all follow-up visits as told by your doctor. This is important. Preventing this condition  Do not douche.  Use only warm water to wash around your vagina.  Use protection when you have sex. This includes:  Latex condoms.  Dental dams.  Limit how many people you have sex with. It is best to only have sex with the same person (be monogamous).  Get tested for STIs. Have your partner get tested.  Wear underwear that is cotton or lined with cotton.  Avoid tight pants and pantyhose. This is most important in summer.  Do not use any products that have nicotine or tobacco in them. These include cigarettes and e-cigarettes. If you need help quitting, ask your doctor.  Do not use illegal drugs.  Limit how much alcohol you drink. Contact a doctor if:  Your symptoms do not get  better, even after you are treated.  You have more discharge or pain when you pee (urinate).  You have a fever.  You have pain in your belly (abdomen).  You have pain with sex.  Your bleed from your vagina between periods. Summary  This infection happens when too many germs (bacteria) grow in the vagina.  Treating this condition can lower your risk for some infections from sex (STIs).  You should also treat this if you are pregnant. It can cause early (premature) birth.  Do not stop taking or using your antibiotic medicine even if you start to feel better. This information is not intended to replace advice given to you by your health care provider. Make sure you discuss any questions you have with your health care provider. Document Released: 12/02/2007 Document Revised: 11/08/2015 Document Reviewed: 11/08/2015 Elsevier Interactive Patient Education  2017 Elsevier Inc.  Muscle Strain A muscle strain (pulled muscle) happens when a muscle is stretched beyond normal length. It happens when a sudden, violent force stretches your muscle too far. Usually, a few of the fibers in your muscle are torn. Muscle strain is common in athletes. Recovery usually takes 1-2 weeks. Complete healing takes 5-6 weeks. Follow these instructions at home:  Follow the PRICE method of treatment to help your injury get better. Do this the first 2-3 days after the injury:  Protect. Protect the muscle to keep it from getting injured again.  Rest. Limit your activity and rest the injured body part.  Ice. Put ice in a plastic bag. Place a towel between your skin and the bag. Then, apply the ice and leave it on from 15-20 minutes each hour. After the third day, switch to moist heat packs.  Compression. Use a splint or elastic bandage on the injured area for comfort. Do not put it on too tightly.  Elevate. Keep the injured body part above the level of your heart.  Only take medicine as told by your  doctor.  Warm up before doing exercise to prevent future muscle strains. Contact a doctor if:  You have more pain or puffiness (swelling) in the injured area.  You feel numbness, tingling, or notice a loss of strength in the injured area. This information is not intended to replace advice given to you by your health care provider. Make sure you discuss any questions you have with your health care provider. Document Released: 12/02/2007 Document Revised: 07/31/2015 Document Reviewed: 09/21/2012 Elsevier Interactive Patient Education  2017 [employer]

## 2016-04-23 NOTE — MAU Note (Signed)
Pt c/o severe back pain that has been on-going since December and pt was seen here for it. Pt states the back pain has been severe for the last three days where she hasn't been able to get out of bed. Pt states she has bad lower abdominal cramping also for the last two to three days. Pt c/o night sweats and swelling in her feet when she wakes up in the morning.

## 2016-07-13 ENCOUNTER — Inpatient Hospital Stay (HOSPITAL_COMMUNITY): Payer: Medicaid Other

## 2016-07-13 ENCOUNTER — Encounter (HOSPITAL_COMMUNITY): Payer: Self-pay

## 2016-07-13 ENCOUNTER — Inpatient Hospital Stay (HOSPITAL_COMMUNITY)
Admission: AD | Admit: 2016-07-13 | Discharge: 2016-07-13 | Disposition: A | Discharge status: Home / Self Care | Payer: Medicaid Other | Source: Ambulatory Visit | Attending: Family Medicine | Admitting: Family Medicine

## 2016-07-13 DIAGNOSIS — IMO0002 Reserved for concepts with insufficient information to code with codable children: Secondary | ICD-10-CM

## 2016-07-13 DIAGNOSIS — O26891 Other specified pregnancy related conditions, first trimester: Secondary | ICD-10-CM | POA: Diagnosis present

## 2016-07-13 DIAGNOSIS — O021 Missed abortion: Secondary | ICD-10-CM | POA: Diagnosis not present

## 2016-07-13 DIAGNOSIS — M545 Low back pain, unspecified: Secondary | ICD-10-CM

## 2016-07-13 DIAGNOSIS — O99331 Smoking (tobacco) complicating pregnancy, first trimester: Secondary | ICD-10-CM | POA: Diagnosis not present

## 2016-07-13 DIAGNOSIS — N898 Other specified noninflammatory disorders of vagina: Secondary | ICD-10-CM

## 2016-07-13 LAB — URINALYSIS, ROUTINE W REFLEX MICROSCOPIC
Bilirubin Urine: NEGATIVE
Glucose, UA: NEGATIVE mg/dL
Hgb urine dipstick: NEGATIVE
Ketones, ur: NEGATIVE mg/dL
Leukocytes, UA: NEGATIVE
Nitrite: NEGATIVE
Protein, ur: NEGATIVE mg/dL
Specific Gravity, Urine: 1.014 (ref 1.005–1.030)
pH: 5 (ref 5.0–8.0)

## 2016-07-13 LAB — CBC
HCT: 35.8 % — ABNORMAL LOW (ref 36.0–46.0)
Hemoglobin: 12.2 g/dL (ref 12.0–15.0)
MCH: 25.8 pg — ABNORMAL LOW (ref 26.0–34.0)
MCHC: 34.1 g/dL (ref 30.0–36.0)
MCV: 75.8 fL — ABNORMAL LOW (ref 78.0–100.0)
Platelets: 246 10*3/uL (ref 150–400)
RBC: 4.72 MIL/uL (ref 3.87–5.11)
RDW: 14.3 % (ref 11.5–15.5)
WBC: 5.7 10*3/uL (ref 4.0–10.5)

## 2016-07-13 LAB — WET PREP, GENITAL
Clue Cells Wet Prep HPF POC: NONE SEEN
Sperm: NONE SEEN
Trich, Wet Prep: NONE SEEN
Yeast Wet Prep HPF POC: NONE SEEN

## 2016-07-13 LAB — HCG, QUANTITATIVE, PREGNANCY: hCG, Beta Chain, Quant, S: 19254 m[IU]/mL — ABNORMAL HIGH (ref <5)

## 2016-07-13 LAB — POCT PREGNANCY, URINE: Preg Test, Ur: POSITIVE — AB

## 2016-07-13 NOTE — MAU Note (Signed)
Pt states that she started having lower back pain and swelling in hands and feet that started last night. States the pain is constant. Rates 8/10. Did not take anything for pain today but took ibuprofen yesterday. States is helped some. States she has a thin, white discharge-denies itching or odor. Pt denies pain with urination. LMP: 04/27/2016. Has not taken a pregnancy test at home.

## 2016-07-13 NOTE — MAU Provider Note (Signed)
Chief Complaint: No chief complaint on file.   First Provider Initiated Contact with Patient 07/13/16 2013        SUBJECTIVE HPI: Eleana Tocco is a 26 y.o. O1H0865 at [redacted]w[redacted]d by LMP who presents to maternity admissions reporting low back pain, swelling in hands and feet and thin white copious vaginal discharge.  . She denies vaginal bleeding, vaginal itching/burning, urinary symptoms, h/a, dizziness, n/v, or fever/chills.    If she is pregnant, wants to abort. Has had 4 deliveries including one term IUFD, and 2 Elective abortions.  Back Pain  This is a new problem. The current episode started yesterday. The problem occurs constantly. The problem is unchanged. The pain is present in the lumbar spine. The quality of the pain is described as aching. The pain does not radiate. The pain is mild. The pain is the same all the time. Stiffness is present all day. Associated symptoms include abdominal pain and pelvic pain. Pertinent negatives include no dysuria, fever, headaches, paresis or paresthesias. She has tried nothing for the symptoms.  Vaginal Discharge  The patient's primary symptoms include pelvic pain and vaginal discharge. The patient's pertinent negatives include no genital itching, genital lesions, genital odor or vaginal bleeding. The current episode started yesterday. The problem occurs constantly. The problem has been unchanged. The pain is mild. The problem affects both sides. She is pregnant. Associated symptoms include abdominal pain and back pain. Pertinent negatives include no chills, constipation, diarrhea, dysuria, fever, headaches, nausea or vomiting. The vaginal discharge was milky and white. There has been no bleeding. She has not been passing clots. She has not been passing tissue. Nothing aggravates the symptoms. She has tried nothing for the symptoms.    RN note; Pt states that she started having lower back pain and swelling in hands and feet that started last night. States the  pain is constant. Rates 8/10. Did not take anything for pain today but took ibuprofen yesterday. States is helped some. States she has a thin, white discharge-denies itching or odor. Pt denies pain with urination. LMP: 04/27/2016. Has not taken a pregnancy test at home.     Electronically signed by Brand Males, RN at 07/13/2016 7:29 PM     Past Medical History:  Diagnosis Date  . Anemia   . Eczema   . Prior pregnancy with fetal demise   . Sickle cell trait [facility])    Past Surgical History:  Procedure Laterality Date  . DILATION AND CURETTAGE OF UTERUS     Social History   Social History  . Marital status: Single    Spouse name: N/A  . Number of children: N/A  . Years of education: N/A   Occupational History  . Not on file.   Social History Main Topics  . Smoking status: Current Every Day Smoker    Packs/day: 1.00    Types: Cigarettes  . Smokeless tobacco: Never Used  . Alcohol use No  . Drug use: No  . Sexual activity: Yes    Birth control/ protection: None   Other Topics Concern  . Not on file   Social History Narrative  . No narrative on file   No current facility-administered medications on file prior to encounter.    Current Outpatient Prescriptions on File Prior to Encounter  Medication Sig Dispense Refill  . metroNIDAZOLE (FLAGYL) 500 MG tablet Take 1 tablet (500 mg total) by mouth 2 (two) times daily. 14 tablet 0  . naproxen (NAPROSYN) 500 MG tablet Take 1  tablet (500 mg total) by mouth 2 (two) times daily with a meal. 20 tablet 0  . [DISCONTINUED] Norgestimate-Ethinyl Estradiol Triphasic (ORTHO TRI-CYCLEN LO) 0.18/0.215/0.25 MG-25 MCG tab Take 1 tablet by mouth daily. (Patient not taking: Reported on 09/25/2014) 1 Package 11   No Known Allergies  I have reviewed patient's Past Medical Hx, Surgical Hx, Family Hx, Social Hx, medications and allergies.   ROS:  Review of Systems  Constitutional: Negative for chills and fever.  Gastrointestinal:  Positive for abdominal pain. Negative for constipation, diarrhea, nausea and vomiting.  Genitourinary: Positive for pelvic pain and vaginal discharge. Negative for dysuria.  Musculoskeletal: Positive for back pain.  Neurological: Negative for headaches and paresthesias.   Review of Systems  Other systems negative   Physical Exam  Physical Exam Patient Vitals for the past 24 hrs:  BP Temp Temp src Pulse Resp SpO2 Height Weight  07/13/16 1929 (!) 103/53 98.3 F (36.8 C) Oral 83 18 100 % 5\' 7"  (1.702 m) 142 lb (64.4 kg)   Constitutional: Well-developed, well-nourished female in no acute distress.  Cardiovascular: normal rate Respiratory: normal effort GI: Abd soft, non-tender. Pos BS x 4 MS: Extremities nontender, no edema, normal ROM Neurologic: Alert and oriented x 4.  GU: Neg CVAT.  PELVIC EXAM: Cervix pink, visually closed, without lesion, scant white creamy discharge, vaginal walls and external genitalia normal Bimanual exam: Cervix 0/long/high, firm, anterior, neg CMT, uterus nontender, nonenlarged, adnexa without tenderness, enlargement, or mass  FHT 0 by doppler and bedside US which showed a 5592w2d fetus with no cardiac activity. Per Dr Adrian BlackwaterStinson, will get formal US  LAB RESULTS Results for orders placed or performed during the hospital encounter of 07/13/16 (from the past 24 hour(s))  Urinalysis, Routine w reflex microscopic     Status: None   Collection Time: 07/13/16  7:25 PM  Result Value Ref Range   Color, Urine YELLOW YELLOW   APPearance CLEAR CLEAR   Specific Gravity, Urine 1.014 1.005 - 1.030   pH 5.0 5.0 - 8.0   Glucose, UA NEGATIVE NEGATIVE mg/dL   Hgb urine dipstick NEGATIVE NEGATIVE   Bilirubin Urine NEGATIVE NEGATIVE   Ketones, ur NEGATIVE NEGATIVE mg/dL   Protein, ur NEGATIVE NEGATIVE mg/dL   Nitrite NEGATIVE NEGATIVE   Leukocytes, UA NEGATIVE NEGATIVE  Pregnancy, urine POC     Status: Abnormal   Collection Time: 07/13/16  8:01 PM  Result Value Ref  Range   Preg Test, Ur POSITIVE (A) NEGATIVE  CBC     Status: Abnormal   Collection Time: 07/13/16  8:24 PM  Result Value Ref Range   WBC 5.7 4.0 - 10.5 K/uL   RBC 4.72 3.87 - 5.11 MIL/uL   Hemoglobin 12.2 12.0 - 15.0 g/dL   HCT 16.135.8 (L) 09.636.0 - 04.546.0 %   MCV 75.8 (L) 78.0 - 100.0 fL   MCH 25.8 (L) 26.0 - 34.0 pg   MCHC 34.1 30.0 - 36.0 g/dL   RDW 40.914.3 81.111.5 - 91.415.5 %   Platelets 246 150 - 400 K/uL  hCG, quantitative, pregnancy     Status: Abnormal   Collection Time: 07/13/16  8:24 PM  Result Value Ref Range   hCG, Beta Chain, Quant, S 19,254 (H) <5 mIU/mL  Wet prep, genital     Status: Abnormal   Collection Time: 07/13/16  8:40 PM  Result Value Ref Range   Yeast Wet Prep HPF POC NONE SEEN NONE SEEN   Trich, Wet Prep NONE SEEN NONE SEEN  Clue Cells Wet Prep HPF POC NONE SEEN NONE SEEN   WBC, Wet Prep HPF POC FEW (A) NONE SEEN   Sperm NONE SEEN        IMAGING US Ob Comp Less 14 Wks  Result Date: 07/13/2016 CLINICAL DATA:  Pelvic pain EXAM: OBSTETRIC <14 WK ULTRASOUND TECHNIQUE: Transabdominal ultrasound was performed for evaluation of the gestation as well as the maternal uterus and adnexal regions. COMPARISON:  None. FINDINGS: Intrauterine gestational sac: Visualized Yolk sac:  Not visualized Embryo:  Visualized Cardiac Activity: Not visualized CRL:   72  mm   13 w 2 d Subchorionic hemorrhage:  None visualized. Maternal uterus/adnexae: Placenta has an inhomogeneous appearance, possibly with hemorrhage. Multiple venous lakes are noted throughout the placenta. The maternal right ovary appears unremarkable. Maternal left ovary not seen. No left-sided pelvic mass evident. No free pelvic fluid. IMPRESSION: Thirteen week intrauterine gestation with evidence of intrauterine fetal demise. No fetal activity or heart motion evident. Heterogeneous appearance of the placenta, likely due to hemorrhage within the placenta. Multiple placental lakes noted. These results will be called to the ordering  clinician or representative by the Radiologist Assistant, and communication documented in the PACS or zVision Dashboard. Electronically Signed   By: Bretta Bang III M.D.   On: 07/13/2016 21:31     MAU Management/MDM: Ordered usual first trimester r/o ectopic labs.   Pelvic exam and cultures done Will check baseline Ultrasound to rule out ectopic.  Consult Dr Adrian Blackwater with presentation, exam findings, and results.     This bleeding/pain can represent a normal pregnancy with bleeding, spontaneous abortion or even an ectopic which can be life-threatening.  The process as listed above helps to determine which of these is present.    ASSESSMENT Single IUP at 106w0d by LMP, [redacted]w[redacted]d by CRL Intrauterine Fetal Demise Back pain likely related to uterine cramping   PLAN Discharge home Dr Adrian Blackwater will arrange D&E and they will call patient to schedule  Pt stable at time of discharge. Encouraged to return here or to other Urgent Care/ED if she develops worsening of symptoms, increase in pain, fever, or other concerning symptoms.    Wynelle Bourgeois CNM, MSN Certified Nurse-Midwife 07/13/2016  8:13 PM

## 2016-07-13 NOTE — Discharge Instructions (Signed)

## 2016-07-14 ENCOUNTER — Encounter (HOSPITAL_COMMUNITY): Payer: Self-pay | Admitting: *Deleted

## 2016-07-14 ENCOUNTER — Telehealth (HOSPITAL_COMMUNITY): Payer: Self-pay

## 2016-07-14 LAB — HIV ANTIBODY (ROUTINE TESTING W REFLEX): HIV Screen 4th Generation wRfx: NONREACTIVE

## 2016-07-14 LAB — GC/CHLAMYDIA PROBE AMP (~~LOC~~) NOT AT ARMC
CHLAMYDIA, DNA PROBE: NEGATIVE
Neisseria Gonorrhea: NEGATIVE

## 2016-07-14 NOTE — Telephone Encounter (Signed)
Called and spoke w/ patient. She is scheduled for Friday 07/16/2016 @1300  for surgery, [facility], nothing to eat or drink after midnight, no lotions, powders, or perfumes, and remove all jewelry. Patient understands. Advised her to call me back if she had any questions or concerns.

## 2016-07-15 DIAGNOSIS — O021 Missed abortion: Secondary | ICD-10-CM | POA: Diagnosis present

## 2016-07-15 NOTE — H&P (Signed)
  Rachel MouldsLamica Bufano is an 26 y.o. [phone] female.   Chief Complaint: Missed Ab at 13 wks HPI: Patient found to have a 13 2/7 wk missed AB, for surgical completion.  Past Medical History:  Diagnosis Date  . Anemia   . Eczema   . Prior pregnancy with fetal demise    weeks 38 gestation fetal demise  . Sickle cell trait (HCC)   . SVD (spontaneous vaginal delivery)    x 4     Past Surgical History:  Procedure Laterality Date  . DILATION AND CURETTAGE OF UTERUS      Family History  Problem Relation Age of Onset  . Hypertension Father    Social History:  reports that she has been smoking Cigarettes.  She has a 7.00 pack-year smoking history. She has never used smokeless tobacco. She reports that she does not drink alcohol or use drugs.  Allergies: No Known Allergies  No current facility-administered medications on file prior to encounter.    Current Outpatient Prescriptions on File Prior to Encounter  Medication Sig Dispense Refill  . metroNIDAZOLE (FLAGYL) 500 MG tablet Take 1 tablet (500 mg total) by mouth 2 (two) times daily. (Patient not taking: Reported on 07/15/2016) 14 tablet 0  . naproxen (NAPROSYN) 500 MG tablet Take 1 tablet (500 mg total) by mouth 2 (two) times daily with a meal. (Patient not taking: Reported on 07/15/2016) 20 tablet 0  . [DISCONTINUED] Norgestimate-Ethinyl Estradiol Triphasic (ORTHO TRI-CYCLEN LO) 0.18/0.215/0.25 MG-25 MCG tab Take 1 tablet by mouth daily. (Patient not taking: Reported on 09/25/2014) 1 Package 11    A comprehensive review of systems was negative.  Height 5\' 7"  (1.702 m), weight 142 lb (64.4 kg), last menstrual period 04/27/2016. General appearance: alert, cooperative and appears stated age Head: Normocephalic, without obvious abnormality, atraumatic Neck: no adenopathy, supple, symmetrical, trachea midline and thyroid not enlarged, symmetric, no tenderness/mass/nodules Lungs: normal effort Heart: normal effort Abdomen: soft, non-tender;  bowel sounds normal; no masses,  no organomegaly Extremities: extremities normal, atraumatic, no cyanosis or edema Skin: Skin color, texture, turgor normal. No rashes or lesions Neurologic: Grossly normal   Lab Results  Component Value Date   WBC 5.7 07/13/2016   HGB 12.2 07/13/2016   HCT 35.8 (L) 07/13/2016   MCV 75.8 (L) 07/13/2016   PLT 246 07/13/2016   Lab Results  Component Value Date   PREGTESTUR POSITIVE (A) 07/13/2016     Assessment/Plan Principal Problem:   Missed ab  For suction D and C. Risks include but are not limited to bleeding, infection, injury to surrounding structures, including bowel, bladder and ureters, blood clots, and death.  Likelihood of success is high.    Reva Boresanya S Pratt 07/15/2016, 10:17 AM

## 2016-07-16 ENCOUNTER — Ambulatory Visit (HOSPITAL_COMMUNITY): Payer: Medicaid Other | Admitting: Anesthesiology

## 2016-07-16 ENCOUNTER — Encounter (HOSPITAL_COMMUNITY)
Admission: RE | Disposition: A | Discharge status: Home / Self Care | Payer: Self-pay | Source: Ambulatory Visit | Attending: Family Medicine

## 2016-07-16 ENCOUNTER — Encounter (HOSPITAL_COMMUNITY): Payer: Self-pay

## 2016-07-16 ENCOUNTER — Ambulatory Visit (HOSPITAL_COMMUNITY)
Admission: RE | Admit: 2016-07-16 | Discharge: 2016-07-16 | Disposition: A | Discharge status: Home / Self Care | Payer: Medicaid Other | Source: Ambulatory Visit | Attending: Family Medicine | Admitting: Family Medicine

## 2016-07-16 DIAGNOSIS — O99331 Smoking (tobacco) complicating pregnancy, first trimester: Secondary | ICD-10-CM | POA: Diagnosis not present

## 2016-07-16 DIAGNOSIS — O021 Missed abortion: Secondary | ICD-10-CM | POA: Diagnosis present

## 2016-07-16 DIAGNOSIS — O26891 Other specified pregnancy related conditions, first trimester: Secondary | ICD-10-CM | POA: Diagnosis not present

## 2016-07-16 DIAGNOSIS — Z3A13 13 weeks gestation of pregnancy: Secondary | ICD-10-CM | POA: Insufficient documentation

## 2016-07-16 DIAGNOSIS — D573 Sickle-cell trait: Secondary | ICD-10-CM | POA: Diagnosis not present

## 2016-07-16 HISTORY — PX: DILATION AND EVACUATION: SHX1459

## 2016-07-16 SURGERY — DILATION AND EVACUATION, UTERUS, SECOND TRIMESTER
Anesthesia: General | Site: Vagina

## 2016-07-16 MED ORDER — KETOROLAC TROMETHAMINE 30 MG/ML IJ SOLN
INTRAMUSCULAR | Status: DC | PRN
  Administered 2016-07-16: 30 mg via INTRAVENOUS

## 2016-07-16 MED ORDER — SUCCINYLCHOLINE CHLORIDE 200 MG/10ML IV SOSY
PREFILLED_SYRINGE | INTRAVENOUS | Status: AC
  Filled 2016-07-16: qty 10

## 2016-07-16 MED ORDER — KETOROLAC TROMETHAMINE 30 MG/ML IJ SOLN
INTRAMUSCULAR | Status: AC
  Filled 2016-07-16: qty 1

## 2016-07-16 MED ORDER — FENTANYL CITRATE (PF) 100 MCG/2ML IJ SOLN
INTRAMUSCULAR | Status: DC | PRN
  Administered 2016-07-16: 100 ug via INTRAVENOUS

## 2016-07-16 MED ORDER — LACTATED RINGERS IV SOLN: INTRAVENOUS | Status: DC

## 2016-07-16 MED ORDER — MIDAZOLAM HCL 5 MG/5ML IJ SOLN
INTRAMUSCULAR | Status: DC | PRN
  Administered 2016-07-16: 2 mg via INTRAVENOUS

## 2016-07-16 MED ORDER — PROPOFOL 10 MG/ML IV BOLUS
INTRAVENOUS | Status: DC | PRN
  Administered 2016-07-16: 200 mg via INTRAVENOUS

## 2016-07-16 MED ORDER — DEXAMETHASONE SODIUM PHOSPHATE 10 MG/ML IJ SOLN
INTRAMUSCULAR | Status: AC
  Filled 2016-07-16: qty 1

## 2016-07-16 MED ORDER — SCOPOLAMINE 1 MG/3DAYS TD PT72
1.0000 | MEDICATED_PATCH | Freq: Once | TRANSDERMAL | Status: DC
  Administered 2016-07-16: 1.5 mg via TRANSDERMAL

## 2016-07-16 MED ORDER — SCOPOLAMINE 1 MG/3DAYS TD PT72
MEDICATED_PATCH | TRANSDERMAL | Status: AC
  Administered 2016-07-16: 1.5 mg via TRANSDERMAL
  Filled 2016-07-16: qty 1

## 2016-07-16 MED ORDER — OXYTOCIN 10 UNIT/ML IJ SOLN
INTRAMUSCULAR | Status: AC
  Filled 2016-07-16: qty 4

## 2016-07-16 MED ORDER — MIDAZOLAM HCL 2 MG/2ML IJ SOLN
INTRAMUSCULAR | Status: AC
  Filled 2016-07-16: qty 2

## 2016-07-16 MED ORDER — LACTATED RINGERS IV SOLN
INTRAVENOUS | Status: DC
  Administered 2016-07-16: 125 mL/h via INTRAVENOUS
  Administered 2016-07-16: 12:00:00 via INTRAVENOUS

## 2016-07-16 MED ORDER — BUPIVACAINE-EPINEPHRINE 0.25% -1:200000 IJ SOLN
INTRAMUSCULAR | Status: DC | PRN
  Administered 2016-07-16: 20 mL

## 2016-07-16 MED ORDER — ONDANSETRON HCL 4 MG/2ML IJ SOLN
INTRAMUSCULAR | Status: DC | PRN
  Administered 2016-07-16: 4 mg via INTRAVENOUS

## 2016-07-16 MED ORDER — LIDOCAINE HCL (CARDIAC) 20 MG/ML IV SOLN
INTRAVENOUS | Status: DC | PRN
  Administered 2016-07-16: 100 mg via INTRAVENOUS

## 2016-07-16 MED ORDER — BUPIVACAINE-EPINEPHRINE (PF) 0.25% -1:200000 IJ SOLN
INTRAMUSCULAR | Status: AC
  Filled 2016-07-16: qty 30

## 2016-07-16 MED ORDER — ONDANSETRON HCL 4 MG/2ML IJ SOLN
INTRAMUSCULAR | Status: AC
  Filled 2016-07-16: qty 2

## 2016-07-16 MED ORDER — DOXYCYCLINE HYCLATE 100 MG IV SOLR
100.0000 mg | INTRAVENOUS | Status: AC
  Administered 2016-07-16: 100 mg via INTRAVENOUS
  Filled 2016-07-16 (×2): qty 100

## 2016-07-16 MED ORDER — FENTANYL CITRATE (PF) 100 MCG/2ML IJ SOLN
25.0000 ug | INTRAMUSCULAR | Status: DC | PRN
  Administered 2016-07-16 (×2): 25 ug via INTRAVENOUS

## 2016-07-16 MED ORDER — LIDOCAINE HCL (CARDIAC) 20 MG/ML IV SOLN
INTRAVENOUS | Status: AC
Start: 2016-07-16 — End: ?
  Filled 2016-07-16: qty 5

## 2016-07-16 MED ORDER — IBUPROFEN 800 MG PO TABS: 800.0000 mg | ORAL_TABLET | Freq: Three times a day (TID) | ORAL | 0 refills | Status: DC | PRN

## 2016-07-16 MED ORDER — FENTANYL CITRATE (PF) 100 MCG/2ML IJ SOLN
INTRAMUSCULAR | Status: AC
  Administered 2016-07-16: 25 ug via INTRAVENOUS
  Filled 2016-07-16: qty 2

## 2016-07-16 MED ORDER — DEXAMETHASONE SODIUM PHOSPHATE 10 MG/ML IJ SOLN
INTRAMUSCULAR | Status: DC | PRN
  Administered 2016-07-16: 10 mg via INTRAVENOUS

## 2016-07-16 MED ORDER — PROMETHAZINE HCL 25 MG/ML IJ SOLN: 6.2500 mg | INTRAMUSCULAR | Status: DC | PRN

## 2016-07-16 MED ORDER — PROPOFOL 10 MG/ML IV BOLUS
INTRAVENOUS | Status: AC
  Filled 2016-07-16: qty 20

## 2016-07-16 MED ORDER — FENTANYL CITRATE (PF) 100 MCG/2ML IJ SOLN
INTRAMUSCULAR | Status: AC
  Filled 2016-07-16: qty 2

## 2016-07-16 MED ORDER — SUCCINYLCHOLINE CHLORIDE 20 MG/ML IJ SOLN
INTRAMUSCULAR | Status: DC | PRN
  Administered 2016-07-16: 120 mg via INTRAVENOUS

## 2016-07-16 SURGICAL SUPPLY — 23 items
ADAPTER VACURETTE TBG SET 14 (CANNULA) IMPLANT
CATH ROBINSON RED A/P 16FR (CATHETERS) ×3 IMPLANT
CLOTH BEACON ORANGE TIMEOUT ST (SAFETY) ×3 IMPLANT
DECANTER SPIKE VIAL GLASS SM (MISCELLANEOUS) ×3 IMPLANT
GLOVE BIOGEL PI IND STRL 7.0 (GLOVE) ×2 IMPLANT
GLOVE BIOGEL PI INDICATOR 7.0 (GLOVE) ×4
GLOVE ECLIPSE 7.0 STRL STRAW (GLOVE) ×6 IMPLANT
GOWN STRL REUS W/TWL LRG LVL3 (GOWN DISPOSABLE) ×9 IMPLANT
KIT BERKELEY 1ST TRIMESTER 3/8 (MISCELLANEOUS) ×5 IMPLANT
KIT BERKELEY 2ND TRIMESTER 1/2 (COLLECTOR) IMPLANT
NS IRRIG 1000ML POUR BTL (IV SOLUTION) ×3 IMPLANT
PACK VAGINAL MINOR WOMEN LF (CUSTOM PROCEDURE TRAY) ×3 IMPLANT
PAD OB MATERNITY 4.3X12.25 (PERSONAL CARE ITEMS) ×3 IMPLANT
PAD PREP 24X48 CUFFED NSTRL (MISCELLANEOUS) ×3 IMPLANT
SET BERKELEY SUCTION TUBING (SUCTIONS) ×2 IMPLANT
TOWEL OR 17X24 6PK STRL BLUE (TOWEL DISPOSABLE) ×6 IMPLANT
TUBE VACURETTE 2ND TRIMESTER (CANNULA) IMPLANT
VACURETTE 10 RIGID CVD (CANNULA) ×2 IMPLANT
VACURETTE 12 RIGID CVD (CANNULA) ×2 IMPLANT
VACURETTE 14MM CVD 1/2 BASE (CANNULA) IMPLANT
VACURETTE 16MM ASPIR CVD .5 (CANNULA) IMPLANT
VACURETTE 8 RIGID CVD (CANNULA) IMPLANT
VACURETTE 9 RIGID CVD (CANNULA) IMPLANT

## 2016-07-16 NOTE — Anesthesia Postprocedure Evaluation (Signed)
Anesthesia Post Note  Patient: Evelyn [name]  Rachel MouldsProcedure(s) Performed: Procedure(s) (LRB): DILATATION AND EVACUATION (D&E) 2ND TRIMESTER (N/A)  Patient location during evaluation: PACU Anesthesia Type: General Level of consciousness: awake, awake and alert and oriented Pain management: pain level controlled Vital Signs Assessment: post-procedure vital signs reviewed and stable Respiratory status: spontaneous breathing, nonlabored ventilation and respiratory function stable Cardiovascular status: blood pressure returned to baseline Postop Assessment: no headache, no backache and adequate PO intake Anesthetic complications: no        Last Vitals:  Vitals:   07/16/16 1530 07/16/16 1545  BP: 123/73 112/63  Pulse: 67 66  Resp: 14 17  Temp:  36.8 C    Last Pain:  Vitals:   07/16/16 1530  TempSrc:   PainSc: 2    Pain Goal: Patients Stated Pain Goal: 3 (07/16/16 1530)               Gwynne EdingerPaul C Jove Beyl

## 2016-07-16 NOTE — Anesthesia Procedure Notes (Signed)
Procedure Name: Intubation Date/Time: 07/16/2016 1:32 PM Performed by: Junious SilkGILBERT, Evangelene Vora Pre-anesthesia Checklist: Patient identified, Emergency Drugs available, Suction available, Patient being monitored and Timeout performed Patient Re-evaluated:Patient Re-evaluated prior to inductionOxygen Delivery Method: Circle system utilized Preoxygenation: Pre-oxygenation with 100% oxygen Intubation Type: IV induction, Cricoid Pressure applied and Rapid sequence Laryngoscope Size: 2 Grade View: Grade I Tube type: Oral Tube size: 7.0 mm Number of attempts: 1 Airway Equipment and Method: Stylet Placement Confirmation: ETT inserted through vocal cords under direct vision,  positive ETCO2,  CO2 detector and breath sounds checked- equal and bilateral Secured at: 21 cm Tube secured with: Tape Dental Injury: Teeth and Oropharynx as per pre-operative assessment

## 2016-07-16 NOTE — Transfer of Care (Signed)
Immediate Anesthesia Transfer of Care Note  Patient: Rachel MouldsLamica Holz  Procedure(s) Performed: Procedure(s): DILATATION AND EVACUATION (D&E) 2ND TRIMESTER (N/A)  Patient Location: PACU  Anesthesia Type:General  Level of Consciousness: awake, alert  and oriented  Airway & Oxygen Therapy: Patient Spontanous Breathing and Patient connected to nasal cannula oxygen  Post-op Assessment: Report given to RN and Post -op Vital signs reviewed and stable  Post vital signs: Reviewed and stable  Last Vitals:  Vitals:   07/16/16 1146  BP: (!) 94/50  Pulse: 77  Resp: 18  Temp: 36.4 C    Last Pain:  Vitals:   07/16/16 1146  TempSrc: Oral      Patients Stated Pain Goal: 3 (07/16/16 1146)  Complications: No apparent anesthesia complications

## 2016-07-16 NOTE — Discharge Instructions (Signed)
°Post Anesthesia Home Care Instructions ° °Activity: °Get plenty of rest for the remainder of the day. A responsible individual must stay with you for 24 hours following the procedure.  °For the next 24 hours, DO NOT: °-Drive a car °-Operate machinery °-Drink alcoholic beverages °-Take any medication unless instructed by your physician °-Make any legal decisions or sign important papers. ° °Meals: °Start with liquid foods such as gelatin or soup. Progress to regular foods as tolerated. Avoid greasy, spicy, heavy foods. If nausea and/or vomiting occur, drink only clear liquids until the nausea and/or vomiting subsides. Call your physician if vomiting continues. ° °Special Instructions/Symptoms: °Your throat may feel dry or sore from the anesthesia or the breathing tube placed in your throat during surgery. If this causes discomfort, gargle with warm salt water. The discomfort should disappear within 24 hours. ° °If you had a scopolamine patch placed behind your ear for the management of post- operative nausea and/or vomiting: ° °1. The medication in the patch is effective for 72 hours, after which it should be removed.  Wrap patch in a tissue and discard in the trash. Wash hands thoroughly with soap and water. °2. You may remove the patch earlier than 72 hours if you experience unpleasant side effects which may include dry mouth, dizziness or visual disturbances. °3. Avoid touching the patch. Wash your hands with soap and water after contact with the patch. °  ° °Dilation and Curettage or Vacuum Curettage, Care After °These instructions give you information about caring for yourself after your procedure. Your doctor may also give you more specific instructions. Call your doctor if you have any problems or questions after your procedure. °Follow these instructions at home: °Activity °· Do not drive or use heavy machinery while taking prescription pain medicine. °· For 24 hours after your procedure, avoid  driving. °· Take short walks often, followed by rest periods. Ask your doctor what activities are safe for you. After one or two days, you may be able to return to your normal activities. °· Do not lift anything that is heavier than 10 lb (4.5 kg) until your doctor approves. °· For at least 2 weeks, or as long as told by your doctor: °? Do not douche. °? Do not use tampons. °? Do not have sex. °General instructions °· Take over-the-counter and prescription medicines only as told by your doctor. This is very important if you take blood thinning medicine. °· Do not take baths, swim, or use a hot tub until your doctor approves. Take showers instead of baths. °· Wear compression stockings as told by your doctor. °· It is up to you to get the results of your procedure. Ask your doctor when your results will be ready. °· Keep all follow-up visits as told by your doctor. This is important. °Contact a doctor if: °· You have very bad cramps that get worse or do not get better with medicine. °· You have very bad pain in your belly (abdomen). °· You cannot drink fluids without throwing up (vomiting). °· You get pain in a different part of the area between your belly and thighs (pelvis). °· You have bad-smelling discharge from your vagina. °· You have a rash. °Get help right away if: °· You are bleeding a lot from your vagina. A lot of bleeding means soaking more than one sanitary pad in an hour, for 2 hours in a row. °· You have clumps of blood (blood clots) coming from your vagina. °· You have   a fever or chills. °· Your belly feels very tender or hard. °· You have chest pain. °· You have trouble breathing. °· You cough up blood. °· You feel dizzy. °· You feel light-headed. °· You pass out (faint). °· You have pain in your neck or shoulder area. °Summary °· Take short walks often, followed by rest periods. Ask your doctor what activities are safe for you. After one or two days, you may be able to return to your normal  activities. °· Do not lift anything that is heavier than 10 lb (4.5 kg) until your doctor approves. °· Do not take baths, swim, or use a hot tub until your doctor approves. Take showers instead of baths. °· Contact your doctor if you have any symptoms of infection, like bad-smelling discharge from your vagina. °This information is not intended to replace advice given to you by your health care provider. Make sure you discuss any questions you have with your health care provider. °Document Released: 12/02/2007 Document Revised: 11/10/2015 Document Reviewed: 11/10/2015 °Elsevier Interactive Patient Education © 2017 [employer] ° °

## 2016-07-16 NOTE — Interval H&P Note (Signed)
History and Physical Interval Note:  07/16/2016 12:00 PM  Evelyn [name]  has presented today for surgery, with the diagnosis of Missed AB  The various methods of treatment have been discussed with the patient and family. After consideration of risks, benefits and other options for treatment, the patient has consented to  Procedure(s): DILATATION AND EVACUATION (D&E) 2ND TRIMESTER (N/A) as a surgical intervention .  The patient's history has been reviewed, patient examined, no change in status, stable for surgery.  I have reviewed the patient's chart and labs.  Questions were answered to the patient's satisfaction.     Reva Boresanya S Lavontae Cornia

## 2016-07-16 NOTE — Anesthesia Preprocedure Evaluation (Signed)
Anesthesia Evaluation  Patient identified by MRN, date of birth, ID band Patient awake    Reviewed: Allergy & Precautions, NPO status , Patient's Chart, lab work & pertinent test results  Airway Mallampati: II  TM Distance: >3 FB Neck ROM: Full    Dental  (+) Teeth Intact, Dental Advisory Given   Pulmonary Current Smoker,    Pulmonary exam normal breath sounds clear to auscultation       Cardiovascular negative cardio ROS Normal cardiovascular exam Rhythm:Regular Rate:Normal     Neuro/Psych negative neurological ROS     GI/Hepatic negative GI ROS, Neg liver ROS,   Endo/Other  negative endocrine ROS  Renal/GU negative Renal ROS     Musculoskeletal negative musculoskeletal ROS (+)   Abdominal   Peds  Hematology  (+) Blood dyscrasia, Sickle cell trait and anemia ,   Anesthesia Other Findings Day of surgery medications reviewed with the patient.  Reproductive/Obstetrics (+) Pregnancy 2nd trimester missed abortion                             Anesthesia Physical Anesthesia Plan  ASA: II  Anesthesia Plan: General   Post-op Pain Management:    Induction: Intravenous, Rapid sequence and Cricoid pressure planned  Airway Management Planned: Oral ETT  Additional Equipment:   Intra-op Plan:   Post-operative Plan: Extubation in OR  Informed Consent: I have reviewed the patients History and Physical, chart, labs and discussed the procedure including the risks, benefits and alternatives for the proposed anesthesia with the patient or authorized representative who has indicated his/her understanding and acceptance.   Dental advisory given  Plan Discussed with: CRNA  Anesthesia Plan Comments: (Risks/benefits of general anesthesia discussed with patient including risk of damage to teeth, lips, gum, and tongue, nausea/vomiting, allergic reactions to medications, and the possibility of heart  attack, stroke and death.  All patient questions answered.  Patient wishes to proceed.)        Anesthesia Quick Evaluation

## 2016-07-16 NOTE — Op Note (Signed)
Evelyn [name]  PROCEDURE DATE: 07/16/2016  PREOPERATIVE DIAGNOSIS: 13 week missed abortion.  POSTOPERATIVE DIAGNOSIS: The same.  PROCEDURE:    Suction Dilation and Evacuation.  SURGEON:  [name]  ANESTHESIA: Cecile Hearingurk, Stephen Edward, MD  INDICATIONS: 89 y.o. [phone] [id number] MAB at 13 2/[redacted] weeks gestation, needing surgical completion.  Risks of surgery were discussed with the patient including but not limited to: bleeding which may require transfusion; infection which may require antibiotics; injury to uterus or surrounding organs;need for additional procedures including laparotomy or laparoscopy; possibility of intrauterine scarring which may impair future fertility; and other postoperative/anesthesia complications. Written informed consent was obtained.    FINDINGS:  A 14 wk size midline uterus, moderate amounts of products of conception, specimen sent to pathology.  ANESTHESIA:   GETT- paracervical block.  ESTIMATED BLOOD LOSS:  Less than 20 ml.  SPECIMENS:  Products of conception sent to pathology  COMPLICATIONS:  None immediate.  PROCEDURE DETAILS:  The patient received intravenous antibiotics while in the preoperative area.  She was then taken to the operating room where general anesthesia was administered and was found to be adequate.  After an adequate timeout was performed, she was placed in the dorsal lithotomy position and examined; then prepped and draped in the sterile manner.   Her bladder was catheterized for an unmeasured amount of clear, yellow urine. A vaginal speculum was then placed in the patient's vagina and a single tooth tenaculum was applied to the anterior lip of the cervix.  A paracervical block using 1% Lidocaine with Epinephrine was administered. The cervix was gently dilated to accommodate a 10 followed by 12 mm suction curette that was gently advanced to the uterine fundus.  The suction device was then activated and curette slowly rotated to clear the uterus of  products of conception. Multiple passes needed to clear the entire POC. A sharp curettage was then performed to confirm complete emptying of the uterus.There was minimal bleeding noted and the tenaculum removed with good hemostasis noted.  All insturmnent, needle and lap counts were correct x 2.The patient tolerated the procedure well.  The patient was taken to the recovery area in stable condition.  [name] 07/16/2016 2:00 PM

## 2016-07-19 ENCOUNTER — Encounter (HOSPITAL_COMMUNITY): Payer: Self-pay | Admitting: Family Medicine

## 2016-08-05 ENCOUNTER — Ambulatory Visit: Payer: Medicaid Other | Admitting: Family Medicine

## 2016-11-27 ENCOUNTER — Emergency Department (HOSPITAL_COMMUNITY)
Admission: EM | Admit: 2016-11-27 | Discharge: 2016-11-27 | Disposition: A | Discharge status: Home / Self Care | Payer: Medicaid Other | Attending: Emergency Medicine | Admitting: Emergency Medicine

## 2016-11-27 ENCOUNTER — Encounter: Payer: Self-pay | Admitting: Emergency Medicine

## 2016-11-27 DIAGNOSIS — M791 Myalgia, unspecified site: Secondary | ICD-10-CM

## 2016-11-27 DIAGNOSIS — M79671 Pain in right foot: Secondary | ICD-10-CM | POA: Insufficient documentation

## 2016-11-27 DIAGNOSIS — M79672 Pain in left foot: Secondary | ICD-10-CM | POA: Insufficient documentation

## 2016-11-27 DIAGNOSIS — R51 Headache: Secondary | ICD-10-CM | POA: Insufficient documentation

## 2016-11-27 DIAGNOSIS — R0981 Nasal congestion: Secondary | ICD-10-CM | POA: Insufficient documentation

## 2016-11-27 DIAGNOSIS — R519 Headache, unspecified: Secondary | ICD-10-CM

## 2016-11-27 DIAGNOSIS — B349 Viral infection, unspecified: Secondary | ICD-10-CM | POA: Diagnosis not present

## 2016-11-27 DIAGNOSIS — F17210 Nicotine dependence, cigarettes, uncomplicated: Secondary | ICD-10-CM | POA: Diagnosis not present

## 2016-11-27 MED ORDER — IBUPROFEN 200 MG PO TABS
400.0000 mg | ORAL_TABLET | Freq: Once | ORAL | Status: AC
  Administered 2016-11-27: 400 mg via ORAL
  Filled 2016-11-27: qty 2

## 2016-11-27 NOTE — ED Provider Notes (Signed)
[facility]-EMERGENCY DEPT Provider Note   CSN: [id number] Arrival date & time: 11/27/16  0106     History   Chief Complaint Chief Complaint  Patient presents with  . Nasal Congestion  . Generalized Body Aches    HPI Evelyn [name] is a 26 y.o. female.  Patient c/o body aches, bil foot pain, nasal congestion which she feels may be allergy related, and intermittent frontal headaches for past week.  Symptoms moderate, persistent. Denies fever or chills. No cough or sob. No chest pain. Denies neck pain or stiffness. No abd pain or vomiting. lnmp 2 weeks ago.    The history is provided by the patient.    Past Medical History:  Diagnosis Date  . Anemia   . Eczema   . Prior pregnancy with fetal demise    weeks 38 gestation fetal demise  . Sickle cell trait (HCC)   . SVD (spontaneous vaginal delivery)    x 4     Patient Active Problem List   Diagnosis Date Noted  . Missed ab 07/15/2016  . Sickle cell trait [facility])     Past Surgical History:  Procedure Laterality Date  . DILATION AND CURETTAGE OF UTERUS    . DILATION AND EVACUATION N/A 07/16/2016   Procedure: DILATATION AND EVACUATION (D&E) 2ND TRIMESTER;  Surgeon: Reva Bores, MD;  Location: [facility] ORS;  Service: Gynecology;  Laterality: N/A;    OB History    Gravida Para Term Preterm AB Living   SAB TAB Ectopic Multiple Live Births     1   0 4       Home Medications    Prior to Admission medications   Medication Sig Start Date End Date Taking? Authorizing Provider  ibuprofen (ADVIL,MOTRIN) 800 MG tablet Take 1 tablet (800 mg total) by mouth every 8 (eight) hours as needed. Patient not taking: Reported on 11/27/2016 07/16/16   Reva Bores, MD    Family History Family History  Problem Relation Age of Onset  . Hypertension Father     Social History Social History  Substance Use Topics  . Smoking status: Current Every Day Smoker    Packs/day: 1.00    Years: 7.00    Types: Cigarettes  . Smokeless  tobacco: Never Used  . Alcohol use No     Allergies   Patient has no known allergies.   Review of Systems Review of Systems  Constitutional: Negative for fever.  HENT: Positive for congestion and rhinorrhea. Negative for sore throat.   Eyes: Negative for pain, redness and visual disturbance.  Respiratory: Negative for shortness of breath.   Cardiovascular: Negative for chest pain and leg swelling.  Gastrointestinal: Negative for abdominal pain and vomiting.  Genitourinary: Negative for dysuria, vaginal bleeding and vaginal discharge.  Musculoskeletal: Positive for myalgias. Negative for back pain.  Skin: Negative for rash.  Neurological: Positive for headaches. Negative for speech difficulty and weakness.  Hematological: Does not bruise/bleed easily.  Psychiatric/Behavioral: Negative for confusion.     Physical Exam Updated Vital Signs BP 118/73 (BP Location: Left Arm)   Pulse 78   Temp 98.3 F (36.8 C) (Oral)   Resp 18   LMP 11/06/2016 (Within Weeks)   SpO2 99%   Breastfeeding? Unknown   Physical Exam  Constitutional: She appears well-developed and well-nourished. No distress.  HENT:  Mouth/Throat: Oropharynx is clear and moist.  +nasal congestion. No sinus/temporal tenderness.   Eyes: Pupils are equal, round, and  reactive to light. Conjunctivae are normal. No scleral icterus.  Neck: Neck supple. No tracheal deviation present.  No stiffness or rigidity.   Cardiovascular: Normal rate, regular rhythm, normal heart sounds and intact distal pulses.   No murmur heard. Pulmonary/Chest: Effort normal and breath sounds normal. No respiratory distress.  Abdominal: Soft. Normal appearance. She exhibits no distension. There is no tenderness.  Genitourinary:  Genitourinary Comments: No cva tenderness  Musculoskeletal: She exhibits no edema.  No leg or foot edema, distal pulses palp. No focal joint erythema, warmth, or pain w movement.   Lymphadenopathy:    She has no  cervical adenopathy.  Neurological: She is alert.  Speech clear/fluent. Ambulates w steady gait  Skin: Skin is warm and dry. No rash noted. She is not diaphoretic.  Psychiatric: She has a normal mood and affect.  Nursing note and vitals reviewed.    ED Treatments / Results  Labs (all labs ordered are listed, but only abnormal results are displayed) Labs Reviewed - No data to display  EKG  EKG Interpretation None       Radiology No results found.  Procedures Procedures (including critical care time)  Medications Ordered in ED Medications  ibuprofen (ADVIL,MOTRIN) tablet 400 mg (not administered)     Initial Impression / Assessment and Plan / ED Course  I have reviewed the triage vital signs and the nursing notes.  Pertinent labs & imaging results that were available during my care of the patient were reviewed by me and considered in my medical decision making (see chart for details).  No meds for headache/body aches yet today.  Motrin po, po fluids.  Pt is non toxic appearing, and in no acute distress.  Symptoms felt most likely related to a viral illness.   Pt currently appears stable for d/c.     Final Clinical Impressions(s) / ED Diagnoses   Final diagnoses:  None    New Prescriptions New Prescriptions   No medications on file     Cathren Laine, MD 11/27/16 [phone]

## 2016-11-27 NOTE — ED Triage Notes (Addendum)
Pt c/o congestion and generalized body aches x2-3 weeks. She specifically reports bilateral foot and leg pain. Denies fever and has taken no OTC medication for symptoms. A&Ox4. Pt does not know if she is pregnant.

## 2016-11-27 NOTE — Discharge Instructions (Signed)
It was our pleasure to provide your ER care today - we hope that you feel better.  Rest. Drink adequate fluids.  Take acetaminophen and/or ibuprofen as need for pain.  Follow up with primary care doctor in the coming week if symptoms fail to improve/resolve.  Return to ER if  worse, new symptoms, high fevers, trouble breathing, other concern.

## 2016-12-24 ENCOUNTER — Inpatient Hospital Stay (HOSPITAL_COMMUNITY)
Admission: AD | Admit: 2016-12-24 | Discharge: 2016-12-25 | Disposition: A | Discharge status: Home / Self Care | Payer: Medicaid Other | Source: Ambulatory Visit | Attending: Obstetrics and Gynecology | Admitting: Obstetrics and Gynecology

## 2016-12-24 ENCOUNTER — Encounter (HOSPITAL_COMMUNITY): Payer: Self-pay | Admitting: *Deleted

## 2016-12-24 DIAGNOSIS — B9689 Other specified bacterial agents as the cause of diseases classified elsewhere: Secondary | ICD-10-CM | POA: Insufficient documentation

## 2016-12-24 DIAGNOSIS — Z3202 Encounter for pregnancy test, result negative: Secondary | ICD-10-CM | POA: Insufficient documentation

## 2016-12-24 DIAGNOSIS — F17210 Nicotine dependence, cigarettes, uncomplicated: Secondary | ICD-10-CM | POA: Insufficient documentation

## 2016-12-24 DIAGNOSIS — N760 Acute vaginitis: Secondary | ICD-10-CM | POA: Insufficient documentation

## 2016-12-24 DIAGNOSIS — O9989 Other specified diseases and conditions complicating pregnancy, childbirth and the puerperium: Secondary | ICD-10-CM | POA: Diagnosis not present

## 2016-12-24 DIAGNOSIS — M549 Dorsalgia, unspecified: Secondary | ICD-10-CM | POA: Diagnosis present

## 2016-12-24 LAB — URINALYSIS, ROUTINE W REFLEX MICROSCOPIC
Bilirubin Urine: NEGATIVE
Glucose, UA: NEGATIVE mg/dL
Hgb urine dipstick: NEGATIVE
Ketones, ur: NEGATIVE mg/dL
LEUKOCYTES UA: NEGATIVE
NITRITE: NEGATIVE
PH: 5 (ref 5.0–8.0)
Protein, ur: NEGATIVE mg/dL
SPECIFIC GRAVITY, URINE: 1.015 (ref 1.005–1.030)

## 2016-12-24 LAB — POCT PREGNANCY, URINE: PREG TEST UR: NEGATIVE

## 2016-12-24 NOTE — MAU Provider Note (Signed)
History     CSN: [id number]  Arrival date and time: 12/24/16 2311   First Provider Initiated Contact with Patient 12/24/16 2355      Chief Complaint  Patient presents with  . Back Pain  . Dizziness  . Fatigue  . Abdominal Pain   HPI  [name] is a 26 y.o. [phone] at 7963w3d gestation presenting to MAU with multiple complaints of pain, feeling faint, legs and feet swelling, diarrhea and nausea, body feeling "heavy" x 2 months, "feels like everything is swelling and is not normal", some pink on tissue, and intermittent white vaginal d/c.  She reports her LMP was "sometime in Sept".   Past Medical History:  Diagnosis Date  . Anemia   . Eczema   . Prior pregnancy with fetal demise    weeks 38 gestation fetal demise  . Sickle cell trait (HCC)   . SVD (spontaneous vaginal delivery)    x 4     Past Surgical History:  Procedure Laterality Date  . DILATION AND CURETTAGE OF UTERUS    . DILATION AND EVACUATION N/A 07/16/2016   Procedure: DILATATION AND EVACUATION (D&E) 2ND TRIMESTER;  Surgeon: Reva BoresPratt, Tanya S, MD;  Location: [facility];  Service: Gynecology;  Laterality: N/A;    Family History  Problem Relation Age of Onset  . Hypertension Father     Social History  Substance Use Topics  . Smoking status: Current Every Day Smoker    Packs/day: 1.00    Years: 7.00    Types: Cigarettes  . Smokeless tobacco: Never Used  . Alcohol use No    Allergies: No Known Allergies  Prescriptions Prior to Admission  Medication Sig Dispense Refill Last Dose  . ibuprofen (ADVIL,MOTRIN) 800 MG tablet Take 1 tablet (800 mg total) by mouth every 8 (eight) hours as needed. (Patient not taking: Reported on 11/27/2016) 30 tablet 0 Completed Course at Unknown time    Review of Systems  Constitutional: Positive for appetite change and fatigue.  HENT: Negative.   Eyes: Negative.   Respiratory: Negative.   Cardiovascular: Negative.   Gastrointestinal: Positive for diarrhea, nausea (eats  [employer] a lot) and vomiting.  Endocrine: Negative.   Genitourinary: Positive for vaginal bleeding (pink d/c with wiping) and vaginal discharge.  Musculoskeletal: Negative.   Skin: Negative.   Allergic/Immunologic: Negative.   Neurological: Negative.   Hematological: Negative.   Psychiatric/Behavioral: Negative.    Physical Exam   Blood pressure 131/73, pulse 72, temperature 98 F (36.7 C), resp. rate 18, height 5\' 7"  (1.702 m), weight 66.7 kg (147 lb), last menstrual period 04/27/2016, SpO2 100 %.  Physical Exam  Nursing note and vitals reviewed. Constitutional: She is oriented to person, place, and time. She appears well-developed and well-nourished.  HENT:  Head: Normocephalic.  Eyes: Pupils are equal, round, and reactive to light.  Neck: Normal range of motion.  Cardiovascular: Normal rate, regular rhythm and normal heart sounds.   Respiratory: Effort normal and breath sounds normal.  GI: Soft. Bowel sounds are normal.  Genitourinary:  Genitourinary Comments: Uterus: non-tender, cx: smooth, pink, no lesions, small amt of thick, white vaginal d/c, closed/long/firm, no CMT or friability, no adnexal tenderness   Musculoskeletal: Normal range of motion.  Neurological: She is alert and oriented to person, place, and time.  Skin: Skin is warm and dry.  Psychiatric: She has a normal mood and affect. Her behavior is normal. Judgment and thought content normal.    MAU Course  Procedures  MDM CCUA UPT  Results for orders placed or performed during the hospital encounter of 12/24/16 (from the past 24 hour(s))  Urinalysis, Routine w reflex microscopic     Status: None   Collection Time: 12/24/16 11:30 PM  Result Value Ref Range   Color, Urine YELLOW YELLOW   APPearance CLEAR CLEAR   Specific Gravity, Urine 1.015 1.005 - 1.030   pH 5.0 5.0 - 8.0   Glucose, UA NEGATIVE NEGATIVE mg/dL   Hgb urine dipstick NEGATIVE NEGATIVE   Bilirubin Urine NEGATIVE NEGATIVE   Ketones, ur  NEGATIVE NEGATIVE mg/dL   Protein, ur NEGATIVE NEGATIVE mg/dL   Nitrite NEGATIVE NEGATIVE   Leukocytes, UA NEGATIVE NEGATIVE  Pregnancy, urine POC     Status: None   Collection Time: 12/24/16 11:35 PM  Result Value Ref Range   Preg Test, Ur NEGATIVE NEGATIVE  Wet prep, genital     Status: Abnormal   Collection Time: 12/25/16 12:05 AM  Result Value Ref Range   Yeast Wet Prep HPF POC NONE SEEN NONE SEEN   Trich, Wet Prep NONE SEEN NONE SEEN   Clue Cells Wet Prep HPF POC PRESENT (A) NONE SEEN   WBC, Wet Prep HPF POC MODERATE (A) NONE SEEN   Sperm NONE SEEN     Assessment and Plan  Bacterial vaginitis - Rx for Flagyl 500 mg PO BID x 7 days - Information provided on BV & Flagyl - Offered Natural Remedies for Bacterial Vaginosis Fill tub with enough to cover lap/lower abdomen warm water.  Mix 1/4 to 1/2 cup of baking soda in water.  Soak in water/baking soda mixture for at least 20 minutes.  Be sure to swish water in between legs to get as much in vagina as possible.  This soak should be done after sexual intercourse and menstrual cycles.    Discharge home Information on [facility] and [facility] given -- pt to call to schedule appt Patient verbalized an understanding of the plan of care and agrees.     Evelyn Mora, MSN, CNM 12/25/2016, 11:55 PM

## 2016-12-24 NOTE — MAU Note (Addendum)
Having all types of pain, feel like I might faint, legs and feet swell, diarrhea today and nausea. Body feels "heavy" for 2 months Just don't feel right. Feels like everything is swelling and is not normal. Some pink on tissue when I wipe and sometimes just white vag d/c. LMP sometime in Sept

## 2016-12-25 DIAGNOSIS — N760 Acute vaginitis: Secondary | ICD-10-CM

## 2016-12-25 DIAGNOSIS — B9689 Other specified bacterial agents as the cause of diseases classified elsewhere: Secondary | ICD-10-CM | POA: Diagnosis not present

## 2016-12-25 DIAGNOSIS — O9989 Other specified diseases and conditions complicating pregnancy, childbirth and the puerperium: Secondary | ICD-10-CM | POA: Diagnosis not present

## 2016-12-25 LAB — WET PREP, GENITAL
SPERM: NONE SEEN
TRICH WET PREP: NONE SEEN
Yeast Wet Prep HPF POC: NONE SEEN

## 2016-12-25 MED ORDER — METRONIDAZOLE 500 MG PO TABS: 500.0000 mg | ORAL_TABLET | Freq: Two times a day (BID) | ORAL | 0 refills | Status: AC

## 2016-12-25 NOTE — Progress Notes (Signed)
Rolitta Dawson CNM in earlier to discuss test results and d/c plan. Written and verbal d/c instructions given and understanding voiced. 

## 2016-12-27 LAB — GC/CHLAMYDIA PROBE AMP (~~LOC~~) NOT AT ARMC
CHLAMYDIA, DNA PROBE: NEGATIVE
NEISSERIA GONORRHEA: NEGATIVE

## 2017-03-22 ENCOUNTER — Other Ambulatory Visit: Payer: Self-pay

## 2017-03-22 ENCOUNTER — Emergency Department (HOSPITAL_COMMUNITY)
Admission: EM | Admit: 2017-03-22 | Discharge: 2017-03-22 | Disposition: A | Discharge status: Home / Self Care | Payer: Medicaid Other | Attending: Emergency Medicine | Admitting: Emergency Medicine

## 2017-03-22 ENCOUNTER — Encounter (HOSPITAL_COMMUNITY): Payer: Self-pay

## 2017-03-22 DIAGNOSIS — N760 Acute vaginitis: Secondary | ICD-10-CM | POA: Insufficient documentation

## 2017-03-22 DIAGNOSIS — F17210 Nicotine dependence, cigarettes, uncomplicated: Secondary | ICD-10-CM | POA: Insufficient documentation

## 2017-03-22 DIAGNOSIS — R51 Headache: Secondary | ICD-10-CM | POA: Diagnosis not present

## 2017-03-22 DIAGNOSIS — N898 Other specified noninflammatory disorders of vagina: Secondary | ICD-10-CM | POA: Diagnosis present

## 2017-03-22 DIAGNOSIS — B9689 Other specified bacterial agents as the cause of diseases classified elsewhere: Secondary | ICD-10-CM | POA: Insufficient documentation

## 2017-03-22 DIAGNOSIS — R519 Headache, unspecified: Secondary | ICD-10-CM

## 2017-03-22 LAB — URINALYSIS, ROUTINE W REFLEX MICROSCOPIC
Bilirubin Urine: NEGATIVE
Glucose, UA: NEGATIVE mg/dL
Hgb urine dipstick: NEGATIVE
Ketones, ur: NEGATIVE mg/dL
Leukocytes, UA: NEGATIVE
Nitrite: NEGATIVE
Protein, ur: NEGATIVE mg/dL
Specific Gravity, Urine: 1.013 (ref 1.005–1.030)
pH: 5 (ref 5.0–8.0)

## 2017-03-22 LAB — POC URINE PREG, ED: Preg Test, Ur: NEGATIVE

## 2017-03-22 LAB — WET PREP, GENITAL
Sperm: NONE SEEN
Trich, Wet Prep: NONE SEEN
Yeast Wet Prep HPF POC: NONE SEEN

## 2017-03-22 LAB — PREGNANCY, URINE: Preg Test, Ur: NEGATIVE

## 2017-03-22 MED ORDER — STERILE WATER FOR INJECTION IJ SOLN
INTRAMUSCULAR | Status: AC
  Administered 2017-03-22: 10 mL
  Filled 2017-03-22: qty 10

## 2017-03-22 MED ORDER — METRONIDAZOLE 500 MG PO TABS: 500.0000 mg | ORAL_TABLET | Freq: Two times a day (BID) | ORAL | 0 refills | Status: DC

## 2017-03-22 MED ORDER — CEFTRIAXONE SODIUM 250 MG IJ SOLR
250.0000 mg | Freq: Once | INTRAMUSCULAR | Status: AC
  Administered 2017-03-22: 250 mg via INTRAMUSCULAR
  Filled 2017-03-22: qty 250

## 2017-03-22 MED ORDER — AZITHROMYCIN 250 MG PO TABS
1000.0000 mg | ORAL_TABLET | Freq: Once | ORAL | Status: AC
  Administered 2017-03-22: 1000 mg via ORAL
  Filled 2017-03-22: qty 4

## 2017-03-22 MED ORDER — KETOROLAC TROMETHAMINE 30 MG/ML IJ SOLN
30.0000 mg | Freq: Once | INTRAMUSCULAR | Status: AC
  Administered 2017-03-22: 30 mg via INTRAVENOUS
  Filled 2017-03-22: qty 1

## 2017-03-22 NOTE — ED Notes (Signed)
Pt is alert and oriented x 4 ans is verbally responsive. Pt reports having a HA.

## 2017-03-22 NOTE — ED Notes (Signed)
Bed: WA08 Expected date:  Expected time:  Means of arrival:  Comments: 

## 2017-03-22 NOTE — ED Triage Notes (Addendum)
Patient c/o headache since yesterday. Patient states she took 200 mg Ibuprofen yesterday and states that it helped some, but returned.Patient states light sensitivity.Patient also c/o vaginal discharge that is thick and white in color.

## 2017-03-22 NOTE — ED Provider Notes (Signed)
[facility]-EMERGENCY DEPT Provider Note   CSN: [id number] Arrival date & time: 03/22/17  0915     History   Chief Complaint Chief Complaint  Patient presents with  . Headache  . Vaginal Discharge    HPI Evelyn [name] is a 27 y.o. female.  HPI\  27 year old female with 2 complaints.  Primarily with vaginal discharge.  She first noticed a few days ago.  She states that is white and thick.  May be some mild vaginal discomfort but really not painful.  No urinary complaints.  She is sexually active.  She is additionally complaining of a headache which began yesterday.  Diffuse but will bit worse posteriorly.  Constant.  Has noticed some improvement with ibuprofen but not relief.  Mild photophobia.  No change in visual acuity.  No acute numbness, tingling or loss of strength.  Denies any similar headache history.  No trauma.  Past Medical History:  Diagnosis Date  . Anemia   . Eczema   . Prior pregnancy with fetal demise    weeks 38 gestation fetal demise  . Sickle cell trait (HCC)   . SVD (spontaneous vaginal delivery)    x 4     Patient Active Problem List   Diagnosis Date Noted  . Bacterial vaginitis 12/25/2016  . Missed ab 07/15/2016  . Sickle cell trait [facility])     Past Surgical History:  Procedure Laterality Date  . DILATION AND CURETTAGE OF UTERUS    . DILATION AND EVACUATION N/A 07/16/2016   Procedure: DILATATION AND EVACUATION (D&E) 2ND TRIMESTER;  Surgeon: Reva Bores, MD;  Location: [facility];  Service: Gynecology;  Laterality: N/A;    OB History    Gravida Para Term Preterm AB Living   6 4 4   1 3    SAB TAB Ectopic Multiple Live Births     1   0 4       Home Medications    Prior to Admission medications   Medication Sig Start Date End Date Taking? Authorizing Provider  albuterol (PROVENTIL) (2.5 MG/3ML) 0.083% nebulizer solution Take 2.5 mg by nebulization every 6 (six) hours as needed for wheezing or shortness of breath.   Yes  [provider]  ibuprofen (ADVIL,MOTRIN) 200 MG tablet Take 200 mg by mouth daily as needed (PAIN).   Yes [provider]  ibuprofen (ADVIL,MOTRIN) 800 MG tablet Take 1 tablet (800 mg total) by mouth every 8 (eight) hours as needed. Patient not taking: Reported on 11/27/2016 07/16/16   Reva Bores, MD    Family History Family History  Problem Relation Age of Onset  . Hypertension Father     Social History Social History   Tobacco Use  . Smoking status: Current Every Day Smoker    Packs/day: 1.00    Years: 7.00    Pack years: 7.00    Types: Cigarettes  . Smokeless tobacco: Never Used  Substance Use Topics  . Alcohol use: No  . Drug use: No     Allergies   Patient has no known allergies.   Review of Systems Review of Systems  All systems reviewed and negative, other than as noted in HPI.  Physical Exam Updated Vital Signs BP 125/87   Pulse 66   Temp 98.1 F (36.7 C)   Resp 16   Ht 5\' 8"  (1.727 m)   Wt 68 kg (150 lb)   LMP 02/27/2017   SpO2 100%   BMI 22.81 kg/m  Physical Exam  Constitutional: She is oriented to person, place, and time. She appears well-developed and well-nourished. No distress.  HENT:  Head: Normocephalic and atraumatic.  Eyes: Conjunctivae are normal. Right eye exhibits no discharge. Left eye exhibits no discharge.  Neck: Neck supple.  Cardiovascular: Normal rate, regular rhythm and normal heart sounds. Exam reveals no gallop and no friction rub.  No murmur heard. Pulmonary/Chest: Effort normal and breath sounds normal. No respiratory distress.  Abdominal: Soft. She exhibits no distension. There is no tenderness.  Genitourinary:  Genitourinary Comments: Chaperone present.  Normal external genitalia.  No concerning lesions noted.  Thick white discharge.  No cervical lesions noted.  No CMT or adnexal tenderness.  Musculoskeletal: She exhibits no edema or tenderness.  Neurological: She is alert and oriented to person,  place, and time. No cranial nerve deficit. She exhibits normal muscle tone. Coordination normal.  Skin: Skin is warm and dry.  Psychiatric: She has a normal mood and affect. Her behavior is normal. Thought content normal.  Nursing note and vitals reviewed.    ED Treatments / Results  Labs (all labs ordered are listed, but only abnormal results are displayed) Labs Reviewed  WET PREP, GENITAL - Abnormal; Notable for the following components:      Result Value   Clue Cells Wet Prep HPF POC PRESENT (*)    WBC, Wet Prep HPF POC MODERATE (*)    All other components within normal limits  URINALYSIS, ROUTINE W REFLEX MICROSCOPIC  PREGNANCY, URINE  POC URINE PREG, ED  GC/CHLAMYDIA PROBE AMP ([place]) NOT AT [facility]    EKG  EKG Interpretation None       Radiology No results found.  Procedures Procedures (including critical care time)  Medications Ordered in ED Medications  ketorolac (TORADOL) 30 MG/ML injection 30 mg (30 mg Intravenous Given 03/22/17 1151)  cefTRIAXone (ROCEPHIN) injection 250 mg (250 mg Intramuscular Given 03/22/17 1152)  azithromycin (ZITHROMAX) tablet 1,000 mg (1,000 mg Oral Given 03/22/17 1151)  sterile water (preservative free) injection (10 mLs  Given 03/22/17 1152)     Initial Impression / Assessment and Plan / ED Course  I have reviewed the triage vital signs and the nursing notes.  Pertinent labs & imaging results that were available during my care of the patient were reviewed by me and considered in my medical decision making (see chart for details).     27 year old female with headache.  No red flags.  She is afebrile.  No neck pain.  No nuchal rigidity on exam.  Neuro exam is nonfocal.  Denies any trauma.  She was empirically treated for GC.  Clue cells noted on wet prep.  She is symptomatic.  Will treat for BV.  Final Clinical Impressions(s) / ED Diagnoses   Final diagnoses:  BV (bacterial vaginosis)  Nonintractable headache, unspecified  chronicity pattern, unspecified headache type    ED Discharge Orders    None      Raeford RazorKohut, Nakea Gouger, MD 03/22/17 1335

## 2017-03-23 LAB — GC/CHLAMYDIA PROBE AMP (~~LOC~~) NOT AT ARMC
Chlamydia: NEGATIVE
Neisseria Gonorrhea: NEGATIVE

## 2017-06-16 ENCOUNTER — Encounter (HOSPITAL_COMMUNITY): Payer: Self-pay

## 2017-06-16 ENCOUNTER — Emergency Department (HOSPITAL_COMMUNITY)
Admission: EM | Admit: 2017-06-16 | Discharge: 2017-06-17 | Disposition: A | Discharge status: Home / Self Care | Payer: Medicaid Other | Attending: Emergency Medicine | Admitting: Emergency Medicine

## 2017-06-16 DIAGNOSIS — R42 Dizziness and giddiness: Secondary | ICD-10-CM | POA: Insufficient documentation

## 2017-06-16 DIAGNOSIS — Z5321 Procedure and treatment not carried out due to patient leaving prior to being seen by health care provider: Secondary | ICD-10-CM | POA: Insufficient documentation

## 2017-06-16 LAB — URINALYSIS, ROUTINE W REFLEX MICROSCOPIC
Bacteria, UA: NONE SEEN
Bilirubin Urine: NEGATIVE
Glucose, UA: NEGATIVE mg/dL
KETONES UR: NEGATIVE mg/dL
Leukocytes, UA: NEGATIVE
NITRITE: NEGATIVE
PH: 6 (ref 5.0–8.0)
Protein, ur: NEGATIVE mg/dL
Specific Gravity, Urine: 1.013 (ref 1.005–1.030)

## 2017-06-16 LAB — CBC
HCT: 40.7 % (ref 36.0–46.0)
HEMOGLOBIN: 13.3 g/dL (ref 12.0–15.0)
MCH: 25.7 pg — AB (ref 26.0–34.0)
MCHC: 32.7 g/dL (ref 30.0–36.0)
MCV: 78.6 fL (ref 78.0–100.0)
PLATELETS: 280 10*3/uL (ref 150–400)
RBC: 5.18 MIL/uL — AB (ref 3.87–5.11)
RDW: 14.3 % (ref 11.5–15.5)
WBC: 5.6 10*3/uL (ref 4.0–10.5)

## 2017-06-16 LAB — BASIC METABOLIC PANEL
ANION GAP: 9 (ref 5–15)
BUN: 8 mg/dL (ref 6–20)
CALCIUM: 8.6 mg/dL — AB (ref 8.9–10.3)
CO2: 23 mmol/L (ref 22–32)
CREATININE: 0.71 mg/dL (ref 0.44–1.00)
Chloride: 106 mmol/L (ref 101–111)
GFR calc non Af Amer: >60 mL/min (ref >60)
Glucose, Bld: 103 mg/dL — ABNORMAL HIGH (ref 65–99)
Potassium: 3.6 mmol/L (ref 3.5–5.1)
SODIUM: 138 mmol/L (ref 135–145)

## 2017-06-16 LAB — CBG MONITORING, ED: Glucose-Capillary: 131 mg/dL — ABNORMAL HIGH (ref 65–99)

## 2017-06-16 LAB — I-STAT BETA HCG BLOOD, ED (MC, WL, AP ONLY): I-stat hCG, quantitative: <5 m[IU]/mL (ref <5)

## 2017-06-16 NOTE — ED Triage Notes (Signed)
Pt states that she woke up today with intermittent dizziness, room spinning, denies n/v, states that all her extremities hurt and feel swollen, and she had back pain yesterday

## 2017-06-17 NOTE — ED Notes (Signed)
Labs reviewed by this nurse

## 2017-08-03 ENCOUNTER — Ambulatory Visit (INDEPENDENT_AMBULATORY_CARE_PROVIDER_SITE_OTHER): Payer: Medicaid Other

## 2017-08-03 DIAGNOSIS — Z111 Encounter for screening for respiratory tuberculosis: Secondary | ICD-10-CM | POA: Diagnosis not present

## 2017-08-03 NOTE — Progress Notes (Signed)
TB skin test placed today on patients right forearm.  Test should be resulted in 48-72 hrs.  Patient aware and tolerated well.

## 2017-08-05 ENCOUNTER — Ambulatory Visit (INDEPENDENT_AMBULATORY_CARE_PROVIDER_SITE_OTHER): Payer: Medicaid Other

## 2017-08-05 DIAGNOSIS — Z111 Encounter for screening for respiratory tuberculosis: Secondary | ICD-10-CM

## 2017-08-05 LAB — TB SKIN TEST
Induration: 0 mm
TB Skin Test: NEGATIVE

## 2017-08-05 NOTE — Progress Notes (Signed)
Patient here for PPD/TB skin test results.  No induration on forearm.  Test negative.  Document given to patient.

## 2017-08-26 ENCOUNTER — Encounter: Payer: Self-pay | Admitting: Advanced Practice Midwife

## 2017-08-26 ENCOUNTER — Inpatient Hospital Stay (HOSPITAL_COMMUNITY)
Admission: AD | Admit: 2017-08-26 | Discharge: 2017-08-26 | Discharge status: Against Medical Advice | Payer: Medicaid Other | Source: Ambulatory Visit | Attending: Obstetrics and Gynecology | Admitting: Obstetrics and Gynecology

## 2017-08-26 DIAGNOSIS — Z5321 Procedure and treatment not carried out due to patient leaving prior to being seen by health care provider: Secondary | ICD-10-CM | POA: Diagnosis not present

## 2017-08-26 LAB — URINALYSIS, ROUTINE W REFLEX MICROSCOPIC
Bilirubin Urine: NEGATIVE
Glucose, UA: NEGATIVE mg/dL
Hgb urine dipstick: NEGATIVE
KETONES UR: NEGATIVE mg/dL
LEUKOCYTES UA: NEGATIVE
NITRITE: NEGATIVE
PROTEIN: NEGATIVE mg/dL
Specific Gravity, Urine: 1.014 (ref 1.005–1.030)
pH: 6 (ref 5.0–8.0)

## 2017-08-26 LAB — POCT PREGNANCY, URINE: Preg Test, Ur: POSITIVE — AB

## 2017-08-26 NOTE — MAU Note (Signed)
Pt called and not in lobby 

## 2017-08-26 NOTE — Progress Notes (Signed)
Second attempt to call pt, not in waiting area

## 2017-08-26 NOTE — Progress Notes (Signed)
Called pt to rm. Pt not in waiting area

## 2017-08-26 NOTE — MAU Note (Signed)
No appetite, dizziness, headache. Fatigued. Symptoms present for 2 wks.  Some vag d/c but think is normal -white color without odor.

## 2017-09-02 ENCOUNTER — Encounter: Payer: Self-pay | Admitting: Family Medicine

## 2017-09-02 ENCOUNTER — Other Ambulatory Visit: Payer: Self-pay

## 2017-09-02 ENCOUNTER — Ambulatory Visit (INDEPENDENT_AMBULATORY_CARE_PROVIDER_SITE_OTHER): Payer: Medicaid Other

## 2017-09-02 VITALS — Wt 165.0 lb

## 2017-09-02 DIAGNOSIS — Z3201 Encounter for pregnancy test, result positive: Secondary | ICD-10-CM

## 2017-09-02 DIAGNOSIS — N912 Amenorrhea, unspecified: Secondary | ICD-10-CM

## 2017-09-02 LAB — POCT PREGNANCY, URINE: Preg Test, Ur: POSITIVE — AB

## 2017-09-02 NOTE — Patient Instructions (Signed)
Common Medications Safe in Pregnancy  Acne:      Constipation:  Benzoyl Peroxide     Colace  Clindamycin      Dulcolax Suppository  Topica Erythromycin     Fibercon  Salicylic Acid      Metamucil         Miralax AVOID:        Senakot   Accutane    Cough:  Retin-A       Cough Drops  Tetracycline      Phenergan w/ Codeine if Rx  Minocycline      Robitussin (Plain & DM)  Antibiotics:     Crabs/Lice:  Ceclor       RID  Cephalosporins    AVOID:  E-Mycins      Kwell  Keflex  Macrobid/Macrodantin   Diarrhea:  Penicillin      Kao-Pectate  Zithromax      Imodium AD         PUSH FLUIDS AVOID:       Cipro     Fever:  Tetracycline      Tylenol (Regular or Extra  Minocycline       Strength)  Levaquin      Extra Strength-Do not          Exceed 8 tabs/24 hrs Caffeine:        <216m/day (equiv. To 1 cup of coffee or  approx. 3 12 oz sodas)         Gas: Cold/Hayfever:       Gas-X  Benadryl      Mylicon  Claritin       Phazyme  **Claritin-D        Chlor-Trimeton    Headaches:  Dimetapp      ASA-Free Excedrin  Drixoral-Non-Drowsy     Cold Compress  Mucinex (Guaifenasin)     Tylenol (Regular or Extra  Sudafed/Sudafed-12 Hour     Strength)  **Sudafed PE Pseudoephedrine   Tylenol Cold & Sinus     Vicks Vapor Rub  Zyrtec  **AVOID if Problems With Blood Pressure         Heartburn: Avoid lying down for at least 1 hour after meals  Aciphex      Maalox     Rash:  Milk of Magnesia     Benadryl    Mylanta       1% Hydrocortisone Cream  Pepcid  Pepcid Complete   Sleep Aids:  Prevacid      Ambien   Prilosec       Benadryl  Rolaids       Chamomile Tea  Tums (Limit 4/day)     Unisom  Zantac       Tylenol PM         Warm milk-add vanilla or  Hemorrhoids:       Sugar for taste  Anusol/Anusol H.C.  (RX: Analapram 2.5%)  Sugar Substitutes:  Hydrocortisone OTC     Ok in moderation  Preparation H      Tucks        Vaseline lotion applied to tissue with  wiping    Herpes:     Throat:  Acyclovir      Oragel  Famvir  Valtrex     Vaccines:         Flu Shot Leg Cramps:       *Gardasil  Benadryl      Hepatitis A         Hepatitis B Nasal Spray:  Pneumovax  Saline Nasal Spray     Polio Booster         Tetanus Nausea:       Tuberculosis test or PPD  Vitamin B6 25 mg TID   AVOID:    Dramamine      *Gardasil  Emetrol       Live Poliovirus  Ginger Root 250 mg QID    MMR (measles, mumps &  High Complex Carbs @ Bedtime    rebella)  Sea Bands-Accupressure    Varicella (Chickenpox)  Unisom 1/2 tab TID     *No known complications           If received before Pain:         Known pregnancy;   Darvocet       Resume series after  Lortab        Delivery  Percocet    Yeast:   Tramadol      Femstat  Tylenol 3      Gyne-lotrimin  Ultram       Monistat  Vicodin           MISC:         All Sunscreens           Hair Coloring/highlights          Insect Repellant's          (Including DEET)         Mystic Tans

## 2017-09-02 NOTE — Progress Notes (Signed)
Patient here today for pregnancy test.  ZOX:[id number] Est 08/06/2017  EDD:05/16/2018.  Asked about abortion clinic.

## 2017-10-08 ENCOUNTER — Encounter (HOSPITAL_COMMUNITY): Payer: Self-pay

## 2017-10-08 ENCOUNTER — Inpatient Hospital Stay (HOSPITAL_COMMUNITY)
Admission: AD | Admit: 2017-10-08 | Discharge: 2017-10-08 | Disposition: A | Discharge status: Home / Self Care | Payer: Medicaid Other | Attending: Obstetrics and Gynecology | Admitting: Obstetrics and Gynecology

## 2017-10-08 ENCOUNTER — Inpatient Hospital Stay (HOSPITAL_COMMUNITY): Payer: Medicaid Other

## 2017-10-08 DIAGNOSIS — O034 Incomplete spontaneous abortion without complication: Secondary | ICD-10-CM | POA: Diagnosis not present

## 2017-10-08 DIAGNOSIS — N760 Acute vaginitis: Secondary | ICD-10-CM | POA: Insufficient documentation

## 2017-10-08 DIAGNOSIS — D573 Sickle-cell trait: Secondary | ICD-10-CM | POA: Insufficient documentation

## 2017-10-08 DIAGNOSIS — B9689 Other specified bacterial agents as the cause of diseases classified elsewhere: Secondary | ICD-10-CM

## 2017-10-08 DIAGNOSIS — Z6790 Unspecified blood type, Rh positive: Secondary | ICD-10-CM | POA: Insufficient documentation

## 2017-10-08 DIAGNOSIS — O0380 Unspecified complication following complete or unspecified spontaneous abortion: Secondary | ICD-10-CM

## 2017-10-08 DIAGNOSIS — F17210 Nicotine dependence, cigarettes, uncomplicated: Secondary | ICD-10-CM | POA: Diagnosis not present

## 2017-10-08 LAB — URINALYSIS, ROUTINE W REFLEX MICROSCOPIC
Bilirubin Urine: NEGATIVE
Glucose, UA: NEGATIVE mg/dL
Hgb urine dipstick: NEGATIVE
Ketones, ur: NEGATIVE mg/dL
LEUKOCYTES UA: NEGATIVE
NITRITE: NEGATIVE
Protein, ur: NEGATIVE mg/dL
SPECIFIC GRAVITY, URINE: 1.015 (ref 1.005–1.030)
pH: 6 (ref 5.0–8.0)

## 2017-10-08 LAB — CBC
HEMATOCRIT: 39.9 % (ref 36.0–46.0)
HEMOGLOBIN: 13.4 g/dL (ref 12.0–15.0)
MCH: 25.6 pg — ABNORMAL LOW (ref 26.0–34.0)
MCHC: 33.6 g/dL (ref 30.0–36.0)
MCV: 76.3 fL — AB (ref 78.0–100.0)
Platelets: 313 10*3/uL (ref 150–400)
RBC: 5.23 MIL/uL — AB (ref 3.87–5.11)
RDW: 14.1 % (ref 11.5–15.5)
WBC: 3.1 10*3/uL — AB (ref 4.0–10.5)

## 2017-10-08 LAB — WET PREP, GENITAL
Sperm: NONE SEEN
Trich, Wet Prep: NONE SEEN
Yeast Wet Prep HPF POC: NONE SEEN

## 2017-10-08 LAB — HCG, QUANTITATIVE, PREGNANCY: HCG, BETA CHAIN, QUANT, S: 67 m[IU]/mL — AB (ref <5)

## 2017-10-08 LAB — POCT PREGNANCY, URINE
PREG TEST UR: POSITIVE — AB
Preg Test, Ur: POSITIVE — AB

## 2017-10-08 MED ORDER — IBUPROFEN 600 MG PO TABS: 600.0000 mg | ORAL_TABLET | Freq: Four times a day (QID) | ORAL | 0 refills | Status: DC | PRN

## 2017-10-08 MED ORDER — METRONIDAZOLE 500 MG PO TABS: 500.0000 mg | ORAL_TABLET | Freq: Two times a day (BID) | ORAL | 0 refills | Status: DC

## 2017-10-08 MED ORDER — IBUPROFEN 600 MG PO TABS
600.0000 mg | ORAL_TABLET | Freq: Four times a day (QID) | ORAL | Status: DC | PRN
  Administered 2017-10-08: 600 mg via ORAL
  Filled 2017-10-08: qty 1

## 2017-10-08 MED ORDER — MISOPROSTOL 200 MCG PO TABS
800.0000 ug | ORAL_TABLET | Freq: Once | ORAL | Status: AC
  Administered 2017-10-08: 800 ug via ORAL
  Filled 2017-10-08: qty 4

## 2017-10-08 MED ORDER — PROMETHAZINE HCL 12.5 MG PO TABS: 12.5000 mg | ORAL_TABLET | Freq: Four times a day (QID) | ORAL | 0 refills | Status: DC | PRN

## 2017-10-08 MED ORDER — OXYCODONE-ACETAMINOPHEN 5-325 MG PO TABS: 1.0000 | ORAL_TABLET | ORAL | 0 refills | Status: DC | PRN

## 2017-10-08 NOTE — Discharge Instructions (Signed)

## 2017-10-08 NOTE — MAU Note (Addendum)
Evelyn [name] is a 27 y.o.  here in MAU reporting:  +vaginal bleeding & discharge Brown and red in color odorous Onset of complaint: started 2 weeks ago after an abortion  +headache Has not taken anything for the pain  +lower abdominal and back pain Cramping Intermittent  Pain score: lower abdominal pain/back pain 7/10 & headache 9/10 Vitals:   10/08/17 1039  BP: 115/79  Pulse: 81  Resp: 16  Temp: 98.3 F (36.8 C)  SpO2: 99%      Lab orders placed from triage: ua

## 2017-10-08 NOTE — MAU Provider Note (Addendum)
History     CSN: [id number]  Arrival date and time: 10/08/17 1028   First Provider Initiated Contact with Patient 10/08/17 1117      Chief Complaint  Patient presents with  . Vaginal Bleeding  . Abdominal Pain  . Back Pain  . Headache   26 y.o. S/p surgical TAB on 09/17/17 at [facility] here with continued intermittent bleeding, vaginal odor, and cramping. Bleeding has been light but with foul odor. Had unprotected IC about 1 week ago, no new partner. C/o HA daily since TAB. HA is located frontal. No N/V or aura.   OB History    Gravida  7   Para  4   Term  4   Preterm      AB  3   Living  3     SAB  1   TAB  2   Ectopic      Multiple  0   Live Births  4        Obstetric Comments  TAB, 09/17/17        Past Medical History:  Diagnosis Date  . Anemia   . Eczema   . Prior pregnancy with fetal demise    weeks 38 gestation fetal demise  . Sickle cell trait (HCC)   . SVD (spontaneous vaginal delivery)    x 4     Past Surgical History:  Procedure Laterality Date  . DILATION AND CURETTAGE OF UTERUS    . DILATION AND EVACUATION N/A 07/16/2016   Procedure: DILATATION AND EVACUATION (D&E) 2ND TRIMESTER;  Surgeon: Reva BoresPratt, Tanya S, MD;  Location: [facility];  Service: Gynecology;  Laterality: N/A;  . THERAPEUTIC ABORTION      Family History  Problem Relation Age of Onset  . Hypertension Father     Social History   Tobacco Use  . Smoking status: Current Every Day Smoker    Packs/day: 1.00    Years: 7.00    Pack years: 7.00    Types: Cigarettes  . Smokeless tobacco: Never Used  Substance Use Topics  . Alcohol use: No  . Drug use: No    Allergies: No Known Allergies  No medications prior to admission.    Review of Systems  Constitutional: Negative for chills and fever.  Gastrointestinal: Positive for abdominal pain.  Genitourinary: Positive for vaginal bleeding. Negative for dysuria.  Musculoskeletal: Negative for myalgias.  Neurological:  Positive for headaches.   Physical Exam   Blood pressure 115/79, pulse 81, temperature 98.3 F (36.8 C), temperature source Oral, resp. rate 16, weight 157 lb 1.3 oz (71.3 kg), SpO2 99 %, unknown if currently breastfeeding.  Physical Exam  Constitutional: She is oriented to person, place, and time. She appears well-developed and well-nourished. No distress.  HENT:  Head: Normocephalic and atraumatic.  Neck: Normal range of motion.  Cardiovascular: Normal rate and regular rhythm.  Respiratory: Effort normal. No respiratory distress.  GI: Soft. She exhibits no distension and no mass. There is no tenderness. There is no rebound and no guarding.  Genitourinary:  Genitourinary Comments: External: no lesions or erythema Vagina: rugated, pink, moist, brown frothy malodorous discharge Uterus: non enlarged, anteverted, non tender, no CMT Adnexae: no masses, no tenderness left, no tenderness right Cervix closed   Musculoskeletal: Normal range of motion.  Neurological: She is alert and oriented to person, place, and time.  Skin: Skin is warm and dry.  Psychiatric: She has a normal mood and affect.   Results for orders placed  or performed during the hospital encounter of 10/08/17 (from the past 24 hour(s))  Urinalysis, Routine w reflex microscopic     Status: Abnormal   Collection Time: 10/08/17 10:51 AM  Result Value Ref Range   Color, Urine YELLOW YELLOW   APPearance HAZY (A) CLEAR   Specific Gravity, Urine 1.015 1.005 - 1.030   pH 6.0 5.0 - 8.0   Glucose, UA NEGATIVE NEGATIVE mg/dL   Hgb urine dipstick NEGATIVE NEGATIVE   Bilirubin Urine NEGATIVE NEGATIVE   Ketones, ur NEGATIVE NEGATIVE mg/dL   Protein, ur NEGATIVE NEGATIVE mg/dL   Nitrite NEGATIVE NEGATIVE   Leukocytes, UA NEGATIVE NEGATIVE  CBC     Status: Abnormal   Collection Time: 10/08/17 11:00 AM  Result Value Ref Range   WBC 3.1 (L) 4.0 - 10.5 K/uL   RBC 5.23 (H) 3.87 - 5.11 MIL/uL   Hemoglobin 13.4 12.0 - 15.0 g/dL    HCT 16.1 09.6 - 04.5 %   MCV 76.3 (L) 78.0 - 100.0 fL   MCH 25.6 (L) 26.0 - 34.0 pg   MCHC 33.6 30.0 - 36.0 g/dL   RDW 40.9 81.1 - 91.4 %   Platelets 313 150 - 400 K/uL  Wet prep, genital     Status: Abnormal   Collection Time: 10/08/17 11:35 AM  Result Value Ref Range   Yeast Wet Prep HPF POC NONE SEEN NONE SEEN   Trich, Wet Prep NONE SEEN NONE SEEN   Clue Cells Wet Prep HPF POC PRESENT (A) NONE SEEN   WBC, Wet Prep HPF POC MANY (A) NONE SEEN   Sperm NONE SEEN   Pregnancy, urine POC     Status: Abnormal   Collection Time: 10/08/17 11:48 AM  Result Value Ref Range   Preg Test, Ur POSITIVE (A) NEGATIVE  hCG, quantitative, pregnancy     Status: Abnormal   Collection Time: 10/08/17 12:37 PM  Result Value Ref Range   hCG, Beta Chain, Quant, S 67 (H) <5 mIU/mL  Pregnancy, urine POC     Status: Abnormal   Collection Time: 10/08/17  1:46 PM  Result Value Ref Range   Preg Test, Ur POSITIVE (A) NEGATIVE   US Ob Transvaginal  Result Date: 10/08/2017 CLINICAL DATA:  Positive pregnancy test. Unknown LMP. Therapeutic abortion approximately 3 weeks ago. EXAM: TRANSVAGINAL OB ULTRASOUND TECHNIQUE: Transvaginal ultrasound was performed for complete evaluation of the gestation as well as the maternal uterus, adnexal regions, and pelvic cul-de-sac. COMPARISON:  None. FINDINGS: Intrauterine gestational sac: None Maternal uterus/adnexae: Heterogeneous echogenicity of uterine myometrium is noted, without evidence of fibroids. There is poor definition of the endometrial-myometrial junction. An ill-defined hyperechoic area is seen within the anterior fundal region, which appears to involve the endometrium and adjacent myometrium. This measures approximately 2 cm and shows internal blood flow on color Doppler ultrasound. Both ovaries are normal in appearance. No evidence of adnexal mass or abnormal free fluid. IMPRESSION: Ill-defined approximately 2 cm hyperechoic area involving the endometrium and adjacent  myometrium in the anterior fundus, suspicious for retained products of conception. No intrauterine gestational sac or adnexal mass identified. Electronically Signed   By: Myles Rosenthal M.D.   On: 10/08/2017 12:53   MAU Course  Procedures  MDM Labs and [place] ordered and reviewed. Will treat BV. Recommend Cytotec for retained POCs. Cytotec given. Pelvic rest and bleeding precautions. Stable for discharge home.  Assessment and Plan   1. Retained products of conception following abortion   2. Post-abortion complication   3. Blood  type, Rh positive   4. Bacterial vaginosis    Discharge home Follow up in [facility] in 1 week-message sent Bleeding/return precautions  Allergies as of 10/08/2017   No Known Allergies     Medication List    TAKE these medications   albuterol (2.5 MG/3ML) 0.083% nebulizer solution Commonly known as:  PROVENTIL Take 2.5 mg by nebulization every 6 (six) hours as needed for wheezing or shortness of breath.   ibuprofen 600 MG tablet Commonly known as:  ADVIL,MOTRIN Take 1 tablet (600 mg total) by mouth every 6 (six) hours as needed for moderate pain. What changed:    medication strength  how much to take  when to take this  reasons to take this  Another medication with the same name was removed. Continue taking this medication, and follow the directions you see here.   metroNIDAZOLE 500 MG tablet Commonly known as:  FLAGYL Take 1 tablet (500 mg total) by mouth 2 (two) times daily.   oxyCODONE-acetaminophen 5-325 MG tablet Commonly known as:  PERCOCET/ROXICET Take 1 tablet by mouth every 4 (four) hours as needed for severe pain.   promethazine 12.5 MG tablet Commonly known as:  PHENERGAN Take 1-2 tablets (12.5-25 mg total) by mouth every 6 (six) hours as needed for nausea or vomiting.      Donette Larry, CNM 10/08/2017, 2:25 PM

## 2017-10-10 LAB — GC/CHLAMYDIA PROBE AMP (~~LOC~~) NOT AT ARMC
CHLAMYDIA, DNA PROBE: NEGATIVE
Neisseria Gonorrhea: NEGATIVE

## 2017-10-17 ENCOUNTER — Ambulatory Visit: Payer: Medicaid Other | Admitting: Advanced Practice Midwife

## 2017-11-17 ENCOUNTER — Ambulatory Visit (HOSPITAL_COMMUNITY)
Admission: EM | Admit: 2017-11-17 | Discharge: 2017-11-17 | Disposition: A | Discharge status: Home / Self Care | Payer: Medicaid Other | Attending: Family Medicine | Admitting: Family Medicine

## 2017-11-17 ENCOUNTER — Encounter (HOSPITAL_COMMUNITY): Payer: Self-pay | Admitting: Emergency Medicine

## 2017-11-17 DIAGNOSIS — F17210 Nicotine dependence, cigarettes, uncomplicated: Secondary | ICD-10-CM | POA: Insufficient documentation

## 2017-11-17 DIAGNOSIS — R1033 Periumbilical pain: Secondary | ICD-10-CM | POA: Insufficient documentation

## 2017-11-17 DIAGNOSIS — R102 Pelvic and perineal pain: Secondary | ICD-10-CM

## 2017-11-17 DIAGNOSIS — Z3202 Encounter for pregnancy test, result negative: Secondary | ICD-10-CM

## 2017-11-17 DIAGNOSIS — Z79899 Other long term (current) drug therapy: Secondary | ICD-10-CM | POA: Insufficient documentation

## 2017-11-17 DIAGNOSIS — R350 Frequency of micturition: Secondary | ICD-10-CM

## 2017-11-17 LAB — POCT URINALYSIS DIP (DEVICE)
BILIRUBIN URINE: NEGATIVE
Glucose, UA: NEGATIVE mg/dL
KETONES UR: NEGATIVE mg/dL
Leukocytes, UA: NEGATIVE
NITRITE: NEGATIVE
PH: 7 (ref 5.0–8.0)
PROTEIN: NEGATIVE mg/dL
Specific Gravity, Urine: 1.02 (ref 1.005–1.030)
Urobilinogen, UA: 0.2 mg/dL (ref 0.0–1.0)

## 2017-11-17 LAB — POCT PREGNANCY, URINE: Preg Test, Ur: NEGATIVE

## 2017-11-17 MED ORDER — METRONIDAZOLE 500 MG PO TABS: 500.0000 mg | ORAL_TABLET | Freq: Two times a day (BID) | ORAL | 0 refills | Status: AC

## 2017-11-17 MED ORDER — METRONIDAZOLE 0.75 % VA GEL: 1.0000 | Freq: Every day | VAGINAL | 0 refills | Status: DC

## 2017-11-17 NOTE — ED Provider Notes (Signed)
[facility]    CSN: [id number] Arrival date & time: 11/17/17  1914     History   Chief Complaint Chief Complaint  Patient presents with  . Urinary Tract Infection    HPI Rachel MouldsLamica Bensman is a 27 y.o. female history of recent abortion with retained POC, presenting today for evaluation of possible UTI.  Patient states that over the past few days she has noticed suprapubic pressure and odor with urination.  She has also had increased frequency.  Denies any burning, itching or irritation.  Has had some slight spotting, but denies vaginal discharge.  She notes that one month ago she had an abortion and was treated for BV.  She has not had a normal menstrual cycles since this, not on any form of birth control.  Patient has returned to being sexually active since.  Had some mild cramping.  Denies fevers, nausea, vomiting.  HPI  Past Medical History:  Diagnosis Date  . Anemia   . Eczema   . Prior pregnancy with fetal demise    weeks 38 gestation fetal demise  . Sickle cell trait (HCC)   . SVD (spontaneous vaginal delivery)    x 4     Patient Active Problem List   Diagnosis Date Noted  . Bacterial vaginitis 12/25/2016  . Missed ab 07/15/2016  . Sickle cell trait [facility](HCC)     Past Surgical History:  Procedure Laterality Date  . DILATION AND CURETTAGE OF UTERUS    . DILATION AND EVACUATION N/A 07/16/2016   Procedure: DILATATION AND EVACUATION (D&E) 2ND TRIMESTER;  Surgeon: Reva BoresPratt, Tanya S, MD;  Location: [facility];  Service: Gynecology;  Laterality: N/A;  . THERAPEUTIC ABORTION      OB History    Gravida  7   Para  4   Term  4   Preterm      AB  3   Living  3     SAB  1   TAB  2   Ectopic      Multiple  0   Live Births  4        Obstetric Comments  TAB, 09/17/17         Home Medications    Prior to Admission medications   Medication Sig Start Date End Date Taking? Authorizing Provider  albuterol (PROVENTIL) (2.5 MG/3ML) 0.083% nebulizer solution  Take 2.5 mg by nebulization every 6 (six) hours as needed for wheezing or shortness of breath.    [provider]  ibuprofen (ADVIL,MOTRIN) 600 MG tablet Take 1 tablet (600 mg total) by mouth every 6 (six) hours as needed for moderate pain. 10/08/17   Donette LarryBhambri, Melanie, CNM  metroNIDAZOLE (FLAGYL) 500 MG tablet Take 1 tablet (500 mg total) by mouth 2 (two) times daily for 7 days. 11/17/17 11/24/17  Niurka Benecke C, PA-C  metroNIDAZOLE (METROGEL VAGINAL) 0.75 % vaginal gel Place 1 Applicatorful vaginally at bedtime. Twice weekly 11/17/17   Lew DawesWieters, Rontavious Albright C, PA-C    Family History Family History  Problem Relation Age of Onset  . Hypertension Father     Social History Social History   Tobacco Use  . Smoking status: Current Every Day Smoker    Packs/day: 1.00    Years: 7.00    Pack years: 7.00    Types: Cigarettes  . Smokeless tobacco: Never Used  Substance Use Topics  . Alcohol use: No  . Drug use: No     Allergies   Patient has no known allergies.  Review of Systems Review of Systems  Constitutional: Negative for fever.  Respiratory: Negative for shortness of breath.   Cardiovascular: Negative for chest pain.  Gastrointestinal: Positive for abdominal pain. Negative for diarrhea, nausea and vomiting.  Genitourinary: Positive for frequency and vaginal bleeding. Negative for dysuria, flank pain, genital sores, hematuria, menstrual problem, vaginal discharge and vaginal pain.  Musculoskeletal: Negative for back pain.  Skin: Negative for rash.  Neurological: Negative for dizziness, light-headedness and headaches.     Physical Exam Triage Vital Signs ED Triage Vitals [11/17/17 2001]  Enc Vitals Group     BP 125/73     Pulse Rate 69     Resp 18     Temp 98.1 F (36.7 C)     Temp Source Oral     SpO2 95 %     Weight      Height      Head Circumference      Peak Flow      Pain Score      Pain Loc      Pain Edu?      Excl. in GC?    No data  found.  Updated Vital Signs BP 125/73 (BP Location: Left Arm)   Pulse 69   Temp 98.1 F (36.7 C) (Oral)   Resp 18   SpO2 95%   Visual Acuity Right Eye Distance:   Left Eye Distance:   Bilateral Distance:    Right Eye Near:   Left Eye Near:    Bilateral Near:     Physical Exam  Constitutional: She appears well-developed and well-nourished. No distress.  HENT:  Head: Normocephalic and atraumatic.  Eyes: Conjunctivae are normal.  Neck: Neck supple.  Cardiovascular: Normal rate and regular rhythm.  No murmur heard. Pulmonary/Chest: Effort normal and breath sounds normal. No respiratory distress.  Abdominal: Soft. There is no tenderness.  Nontender to light deep palpation throughout all 4 quadrants and epigastrium, mild tenderness to suprapubic area  Musculoskeletal: She exhibits no edema.  Neurological: She is alert.  Skin: Skin is warm and dry.  Psychiatric: She has a normal mood and affect.  Nursing note and vitals reviewed.    UC Treatments / Results  Labs (all labs ordered are listed, but only abnormal results are displayed) Labs Reviewed  POCT URINALYSIS DIP (DEVICE) - Abnormal; Notable for the following components:      Result Value   Hgb urine dipstick MODERATE (*)    All other components within normal limits  POCT PREGNANCY, URINE  CERVICOVAGINAL ANCILLARY ONLY    EKG None  Radiology No results found.  Procedures Procedures (including critical care time)  Medications Ordered in UC Medications - No data to display  Initial Impression / Assessment and Plan / UC Course  I have reviewed the triage vital signs and the nursing notes.  Pertinent labs & imaging results that were available during my care of the patient were reviewed by me and considered in my medical decision making (see chart for details).     UA negative for UTI, vaginal swab obtained to check for STDs/yeast/BV.  Mild abdominal pain, vital signs stable.  We will go ahead and  empirically treat for BV given recurrent history of this.  Provided metronidazole twice daily for 1 week.  Also provided MetroGel to begin after taking and use for prevention twice weekly.  Will call patient with results and alter treatment as needed.Discussed strict return precautions. Patient verbalized understanding and is agreeable with plan.  Final  Clinical Impressions(s) / UC Diagnoses   Final diagnoses:  Urinary frequency  Suprapubic pressure     Discharge Instructions     No signs of UTI and urine.  I will go ahead and treat you for bacterial vaginosis with metronidazole, please take twice daily for the next week, please do not drink alcohol until 24 hours after taking the last tablet.  Please refrain from sexual intercourse for 7 days while medicines eliminating infection.  After taking the pills you may use MetroGel by placing applicatorful vaginally at bedtime twice weekly to try to prevent further symptoms.  We are testing you for Gonorrhea, Chlamydia, Trichomonas, Yeast and Bacterial Vaginosis. We will call you if anything is positive and let you know if you require any further treatment. Please inform partners of any positive results.   Please return if symptoms not improving with treatment, development of fever, nausea, vomiting, abdominal pain.    ED Prescriptions    Medication Sig Dispense Auth. Provider   metroNIDAZOLE (FLAGYL) 500 MG tablet Take 1 tablet (500 mg total) by mouth 2 (two) times daily for 7 days. 14 tablet Arthi Mcdonald C, PA-C   metroNIDAZOLE (METROGEL VAGINAL) 0.75 % vaginal gel Place 1 Applicatorful vaginally at bedtime. Twice weekly 70 g Lucciano Vitali, Post C, PA-C     Controlled Substance Prescriptions [place] Controlled Substance Registry consulted? Not Applicable   Lew Dawes, [place] 11/17/17 2037

## 2017-11-17 NOTE — ED Triage Notes (Signed)
Pt here for UTI sx x 2 days  

## 2017-11-17 NOTE — Discharge Instructions (Addendum)
No signs of UTI and urine.  I will go ahead and treat you for bacterial vaginosis with metronidazole, please take twice daily for the next week, please do not drink alcohol until 24 hours after taking the last tablet.  Please refrain from sexual intercourse for 7 days while medicines eliminating infection.  After taking the pills you may use MetroGel by placing applicatorful vaginally at bedtime twice weekly to try to prevent further symptoms.  We are testing you for Gonorrhea, Chlamydia, Trichomonas, Yeast and Bacterial Vaginosis. We will call you if anything is positive and let you know if you require any further treatment. Please inform partners of any positive results.   Please return if symptoms not improving with treatment, development of fever, nausea, vomiting, abdominal pain.

## 2017-11-18 LAB — CERVICOVAGINAL ANCILLARY ONLY
Bacterial vaginitis: NEGATIVE
CHLAMYDIA, DNA PROBE: NEGATIVE
Candida vaginitis: NEGATIVE
Neisseria Gonorrhea: NEGATIVE
Trichomonas: NEGATIVE

## 2018-03-13 ENCOUNTER — Encounter (HOSPITAL_COMMUNITY): Payer: Self-pay | Admitting: *Deleted

## 2018-03-13 ENCOUNTER — Inpatient Hospital Stay (HOSPITAL_COMMUNITY)
Admission: AD | Admit: 2018-03-13 | Discharge: 2018-03-13 | Disposition: A | Discharge status: Home / Self Care | Payer: Medicaid Other | Attending: Family Medicine | Admitting: Family Medicine

## 2018-03-13 ENCOUNTER — Other Ambulatory Visit: Payer: Self-pay

## 2018-03-13 DIAGNOSIS — N760 Acute vaginitis: Secondary | ICD-10-CM | POA: Diagnosis not present

## 2018-03-13 DIAGNOSIS — M545 Low back pain: Secondary | ICD-10-CM | POA: Diagnosis present

## 2018-03-13 DIAGNOSIS — F17210 Nicotine dependence, cigarettes, uncomplicated: Secondary | ICD-10-CM | POA: Diagnosis not present

## 2018-03-13 DIAGNOSIS — B9689 Other specified bacterial agents as the cause of diseases classified elsewhere: Secondary | ICD-10-CM | POA: Diagnosis not present

## 2018-03-13 DIAGNOSIS — Z3202 Encounter for pregnancy test, result negative: Secondary | ICD-10-CM | POA: Insufficient documentation

## 2018-03-13 LAB — URINALYSIS, ROUTINE W REFLEX MICROSCOPIC
BILIRUBIN URINE: NEGATIVE
GLUCOSE, UA: NEGATIVE mg/dL
HGB URINE DIPSTICK: NEGATIVE
KETONES UR: NEGATIVE mg/dL
LEUKOCYTES UA: NEGATIVE
Nitrite: NEGATIVE
PROTEIN: NEGATIVE mg/dL
SPECIFIC GRAVITY, URINE: 1.013 (ref 1.005–1.030)
pH: 6 (ref 5.0–8.0)

## 2018-03-13 LAB — CBC
HCT: 44 % (ref 36.0–46.0)
Hemoglobin: 14.3 g/dL (ref 12.0–15.0)
MCH: 25.3 pg — ABNORMAL LOW (ref 26.0–34.0)
MCHC: 32.5 g/dL (ref 30.0–36.0)
MCV: 77.7 fL — ABNORMAL LOW (ref 80.0–100.0)
Platelets: 318 K/uL (ref 150–400)
RBC: 5.66 MIL/uL — ABNORMAL HIGH (ref 3.87–5.11)
RDW: 15.7 % — ABNORMAL HIGH (ref 11.5–15.5)
WBC: 4.6 K/uL (ref 4.0–10.5)
nRBC: 0 % (ref 0.0–0.2)

## 2018-03-13 LAB — WET PREP, GENITAL
Sperm: NONE SEEN
Trich, Wet Prep: NONE SEEN
YEAST WET PREP: NONE SEEN

## 2018-03-13 LAB — HEPATITIS B SURFACE ANTIGEN: Hepatitis B Surface Ag: NEGATIVE

## 2018-03-13 LAB — POCT PREGNANCY, URINE: Preg Test, Ur: NEGATIVE

## 2018-03-13 MED ORDER — METRONIDAZOLE 0.75 % VA GEL: 1.0000 | Freq: Every day | VAGINAL | 0 refills | Status: AC

## 2018-03-13 MED ORDER — KETOROLAC TROMETHAMINE 60 MG/2ML IM SOLN
30.0000 mg | Freq: Once | INTRAMUSCULAR | Status: AC
Start: 2018-03-13 — End: 2018-03-13
  Administered 2018-03-13: 30 mg via INTRAMUSCULAR
  Filled 2018-03-13: qty 2

## 2018-03-13 NOTE — MAU Note (Signed)
Having pain in lower abd and low back off and on for abour a wk.  D/c with an odor for a wk.  Nausea and tired off and on for a wk or two.

## 2018-03-13 NOTE — Discharge Instructions (Signed)

## 2018-03-13 NOTE — MAU Provider Note (Addendum)
History     CSN: [id number]  Arrival date and time: 03/13/18 1303   First Provider Initiated Contact with Patient 03/13/18 1338      Chief Complaint  Patient presents with  . Abdominal Pain  . Back Pain  . Vaginal Discharge  . Nausea   Evelyn [name] is a [id number] presenting with fatigue, pelvic/low back pain, and thick, white, malodorous vaginal discharge. Back/pelvic pain is 5/10, been happening for the past week, and she has not done anything to relieve it. It is worst in the morning, but gets slightly better throughout the day. She has a new sexual partner [id number], who is diabetic and currently complaining of penile pain and erectile dysfunction. They do not use barrier methods, or any other forms of birth control. LMP (12/18) lasted 7-8 days. It was longer and heavier than her usual period, which lasts 4-5 days.   Pertinent Gynecological History: Menses: flow is moderate and usually lasting 4 to 5 days Bleeding: None Contraception: none DES exposure: denies Blood transfusions: none Sexually transmitted diseases: currently at risk Previous GYN Procedures: TABs, D&C  Last mammogram: N/a Date: N/a Last pap: normal    Past Medical History:  Diagnosis Date  . Anemia   . Eczema   . Prior pregnancy with fetal demise    weeks 38 gestation fetal demise  . Sickle cell trait (HCC)   . SVD (spontaneous vaginal delivery)    x 4     Past Surgical History:  Procedure Laterality Date  . DILATION AND CURETTAGE OF UTERUS    . DILATION AND EVACUATION N/A 07/16/2016   Procedure: DILATATION AND EVACUATION (D&E) 2ND TRIMESTER;  Surgeon: Reva BoresPratt, Tanya S, MD;  Location: [facility];  Service: Gynecology;  Laterality: N/A;  . THERAPEUTIC ABORTION      Family History  Problem Relation Age of Onset  . Hypertension Father     Social History   Tobacco Use  . Smoking status: Current Every Day Smoker    Packs/day: 0.25    Years: 7.00    Pack years: 1.75    Types: Cigars  . Smokeless tobacco:  Never Used  Substance Use Topics  . Alcohol use: No  . Drug use: No    Allergies: No Known Allergies  Medications Prior to Admission  Medication Sig Dispense Refill Last Dose  . albuterol (PROVENTIL) (2.5 MG/3ML) 0.083% nebulizer solution Take 2.5 mg by nebulization every 6 (six) hours as needed for wheezing or shortness of breath.   Past Week at Unknown time  . ibuprofen (ADVIL,MOTRIN) 600 MG tablet Take 1 tablet (600 mg total) by mouth every 6 (six) hours as needed for moderate pain. 30 tablet 0   . metroNIDAZOLE (METROGEL VAGINAL) 0.75 % vaginal gel Place 1 Applicatorful vaginally at bedtime. Twice weekly 70 g 0     Review of Systems  Constitutional: Positive for fatigue. Negative for activity change, appetite change, fever and unexpected weight change.  HENT: Negative.   Eyes: Negative.   Respiratory: Negative.  Negative for shortness of breath.   Cardiovascular: Negative.   Gastrointestinal: Negative.  Negative for abdominal pain, constipation, diarrhea, nausea and vomiting.  Endocrine: Negative.   Genitourinary: Positive for pelvic pain and vaginal discharge (thick, white, malodorous). Negative for decreased urine volume, difficulty urinating, dysuria, flank pain, frequency, menstrual problem, urgency, vaginal bleeding and vaginal pain.  Musculoskeletal: Negative.   Skin: Negative.   Allergic/Immunologic: Negative.   Neurological: Negative.  Negative for dizziness, light-headedness and headaches.  Hematological: Negative.  Psychiatric/Behavioral: Negative.    Physical Exam   Blood pressure 123/78, pulse 73, temperature 98.3 F (36.8 C), temperature source Oral, resp. rate 16, weight 71.8 kg, last menstrual period 02/22/2018, SpO2 99 %, unknown if currently breastfeeding.  Physical Exam  Nursing note and vitals reviewed. Constitutional: She is oriented to person, place, and time. She appears well-developed and well-nourished. No distress.  HENT:  Head: Normocephalic.   Neck: Normal range of motion.  Cardiovascular: Normal rate, regular rhythm and normal heart sounds.  Respiratory: Effort normal and breath sounds normal. No respiratory distress.  GI: Soft. Bowel sounds are normal. She exhibits no distension. There is no abdominal tenderness.  Genitourinary:    Uterus normal.     Vaginal discharge (thick and white) present.     Genitourinary Comments: Tenderness on palpation in her left adnexa, no mass palpated bilat, No CMT   Musculoskeletal: Normal range of motion.  Neurological: She is alert and oriented to person, place, and time.  Skin: Skin is warm and dry. She is not diaphoretic.  Psychiatric: She has a normal mood and affect. Her behavior is normal. Judgment and thought content normal.   Results for orders placed or performed during the hospital encounter of 03/13/18 (from the past 24 hour(s))  Urinalysis, Routine w reflex microscopic     Status: Abnormal   Collection Time: 03/13/18  1:27 PM  Result Value Ref Range   Color, Urine YELLOW YELLOW   APPearance HAZY (A) CLEAR   Specific Gravity, Urine 1.013 1.005 - 1.030   pH 6.0 5.0 - 8.0   Glucose, UA NEGATIVE NEGATIVE mg/dL   Hgb urine dipstick NEGATIVE NEGATIVE   Bilirubin Urine NEGATIVE NEGATIVE   Ketones, ur NEGATIVE NEGATIVE mg/dL   Protein, ur NEGATIVE NEGATIVE mg/dL   Nitrite NEGATIVE NEGATIVE   Leukocytes, UA NEGATIVE NEGATIVE  Pregnancy, urine POC     Status: None   Collection Time: 03/13/18  1:29 PM  Result Value Ref Range   Preg Test, Ur NEGATIVE NEGATIVE  Wet prep, genital     Status: Abnormal   Collection Time: 03/13/18  2:07 PM  Result Value Ref Range   Yeast Wet Prep HPF POC NONE SEEN NONE SEEN   Trich, Wet Prep NONE SEEN NONE SEEN   Clue Cells Wet Prep HPF POC PRESENT (A) NONE SEEN   WBC, Wet Prep HPF POC FEW (A) NONE SEEN   Sperm NONE SEEN   CBC     Status: Abnormal   Collection Time: 03/13/18  2:32 PM  Result Value Ref Range   WBC 4.6 4.0 - 10.5 K/uL   RBC 5.66 (H)  3.87 - 5.11 MIL/uL   Hemoglobin 14.3 12.0 - 15.0 g/dL   HCT 11.944.0 14.736.0 - 82.946.0 %   MCV 77.7 (L) 80.0 - 100.0 fL   MCH 25.3 (L) 26.0 - 34.0 pg   MCHC 32.5 30.0 - 36.0 g/dL   RDW 56.215.7 (H) 13.011.5 - 86.515.5 %   Platelets 318 150 - 400 K/uL   nRBC 0.0 0.0 - 0.2 %   MAU Course  Procedures  MDM CBC, STI panel Wet prep, GC/CT Bimanual pelvic exam Toradol for pain  Assessment and Plan  Bacterial vaginosis - Plan: Discharge patient Metronidazole 0.75% vaginal gel, 1 applicatorful vaginally at bedtime for 5 days Discussed birth control options, encouraged [facility]  Bernerd LimboJamilla R [name], SNM 03/13/2018, 1:52 PM   CNM attestation:  I have seen and examined this patient and  agree with above documentation in the student's note.   Evelyn [name] is a 28 y.o. [id number] female reporting pelvic pain and vaginal discharge  Associated symptoms:  no fever and chills no abdominal pain no vaginal bleeding + vaginal discharge no urinary complaints no GI complaints  PE: Patient Vitals for the past 24 hrs:  BP Temp Temp src Pulse Resp SpO2 Weight  03/13/18 1321 123/78 98.3 F (36.8 C) Oral 73 16 99 % 71.8 kg   Gen: calm comfortable, NAD Resp: normal effort, no distress Heart: Regular rate Abd: Soft, NT, Pos BS x 4 Neuro: A&O x 4 Pelvic exam: NEFG, moderate amount of thick, white discharge, no vaginal bleeding. Uterus normal size, no adnexal masses or tenderness. no cervical motion tenderness.  ROS, labs, PMH reviewed  Orders Placed This Encounter  Procedures  . Wet prep, genital  . Urinalysis, Routine w reflex microscopic  . CBC  . RPR  . HIV Antibody (routine testing w rflx)  . Hepatitis B surface antigen  . Hepatitis C antibody  . Pregnancy, urine POC  . Discharge patient   Meds ordered this encounter  Medications  . ketorolac (TORADOL) injection 30 mg  . metroNIDAZOLE (METROGEL VAGINAL) 0.75 % vaginal gel    Sig: Place 1 Applicatorful vaginally  at bedtime for 5 days.    Dispense:  50 g    Refill:  0    Order Specific Question:   Supervising Provider    Answer:   Conan Bowens [[id number]]    MDM Labs ordered and reviewed. Pain improved. No evidence of acute abdominal or pelvic process. Will treat BV. Stable for discharge home.  Assessment 1. Bacterial vaginosis     Plan: - Discharge home  - Rx Metrogel - Follow-up as scheduled at your doctor's office or sooner as needed if symptoms worsen.  Donette Larry, CNM 03/13/2018 5:19 PM

## 2018-03-14 LAB — GC/CHLAMYDIA PROBE AMP (~~LOC~~) NOT AT ARMC
Chlamydia: NEGATIVE
Neisseria Gonorrhea: NEGATIVE

## 2018-03-14 LAB — RPR: RPR Ser Ql: NONREACTIVE

## 2018-03-14 LAB — HEPATITIS C ANTIBODY: HCV Ab: <0.1 s/co ratio (ref 0.0–0.9)

## 2018-03-14 LAB — HIV ANTIBODY (ROUTINE TESTING W REFLEX): HIV Screen 4th Generation wRfx: NONREACTIVE

## 2018-06-27 ENCOUNTER — Ambulatory Visit (HOSPITAL_COMMUNITY)
Admission: EM | Admit: 2018-06-27 | Discharge: 2018-06-27 | Disposition: A | Discharge status: Home / Self Care | Payer: Medicaid Other | Attending: Family Medicine | Admitting: Family Medicine

## 2018-06-27 ENCOUNTER — Other Ambulatory Visit: Payer: Self-pay

## 2018-06-27 ENCOUNTER — Encounter (HOSPITAL_COMMUNITY): Payer: Self-pay

## 2018-06-27 DIAGNOSIS — H1013 Acute atopic conjunctivitis, bilateral: Secondary | ICD-10-CM | POA: Diagnosis not present

## 2018-06-27 MED ORDER — OLOPATADINE HCL 0.2 % OP SOLN: 1.0000 [drp] | Freq: Once | OPHTHALMIC | 0 refills | Status: AC

## 2018-06-27 MED ORDER — CETIRIZINE HCL 10 MG PO TABS: 10.0000 mg | ORAL_TABLET | Freq: Every day | ORAL | 0 refills | Status: DC

## 2018-06-27 NOTE — ED Triage Notes (Signed)
Pt presents with bilateral eye irritation & drainage; pt also complains of a rash on different areas of her body from unknown source.

## 2018-06-27 NOTE — Discharge Instructions (Signed)
Try the allergy drops and zyrtec daily Follow up as needed for continued or worsening symptoms

## 2018-06-28 NOTE — ED Provider Notes (Signed)
[facility]    CSN: [id number] Arrival date & time: 06/27/18  1220     History   Chief Complaint Chief Complaint  Patient presents with  . Eye Problem    Bilateral  . Rash    HPI Evelyn [name] is a 28 y.o. female.   Pt is a 28 year old female that presents with bilateral eye watering, irritation, sneezing that has been constant over the last 2 weeks. Symptoms have been waxing and waning. She has not done anything to treat the symptoms. She also wears fake eyelashes. She has not been able to wear these. Denies any trouble with vision. Hx of allergies. No fever, chills.   ROS per HPI      Past Medical History:  Diagnosis Date  . Anemia   . Eczema   . Prior pregnancy with fetal demise    weeks 38 gestation fetal demise  . Sickle cell trait (HCC)   . SVD (spontaneous vaginal delivery)    x 4     Patient Active Problem List   Diagnosis Date Noted  . Bacterial vaginitis 12/25/2016  . Missed ab 07/15/2016  . Sickle cell trait [facility])     Past Surgical History:  Procedure Laterality Date  . DILATION AND CURETTAGE OF UTERUS    . DILATION AND EVACUATION N/A 07/16/2016   Procedure: DILATATION AND EVACUATION (D&E) 2ND TRIMESTER;  Surgeon: Reva Bores, MD;  Location: [facility];  Service: Gynecology;  Laterality: N/A;  . THERAPEUTIC ABORTION      OB History    Gravida  7   Para  4   Term  4   Preterm      AB  3   Living  3     SAB  1   TAB  2   Ectopic      Multiple  0   Live Births  4        Obstetric Comments  TAB, 09/17/17         Home Medications    Prior to Admission medications   Medication Sig Start Date End Date Taking? Authorizing Provider  albuterol (PROVENTIL) (2.5 MG/3ML) 0.083% nebulizer solution Take 2.5 mg by nebulization every 6 (six) hours as needed for wheezing or shortness of breath.    [provider]  cetirizine (ZYRTEC) 10 MG tablet Take 1 tablet (10 mg total) by mouth daily. 06/27/18   Dahlia Byes A,  NP  ibuprofen (ADVIL,MOTRIN) 600 MG tablet Take 1 tablet (600 mg total) by mouth every 6 (six) hours as needed for moderate pain. 10/08/17   Donette Larry, CNM    Family History Family History  Problem Relation Age of Onset  . Hypertension Father     Social History Social History   Tobacco Use  . Smoking status: Current Every Day Smoker    Packs/day: 0.25    Years: 7.00    Pack years: 1.75    Types: Cigars  . Smokeless tobacco: Never Used  Substance Use Topics  . Alcohol use: No  . Drug use: No     Allergies   Patient has no known allergies.   Review of Systems Review of Systems   Physical Exam Triage Vital Signs ED Triage Vitals  Enc Vitals Group     BP 06/27/18 1239 128/70     Pulse Rate 06/27/18 1239 72     Resp 06/27/18 1239 18     Temp 06/27/18 1239 98.3 F (36.8 C)  Temp Source 06/27/18 1239 Oral     SpO2 06/27/18 1239 98 %     Weight --      Height --      Head Circumference --      Peak Flow --      Pain Score 06/27/18 1241 4     Pain Loc --      Pain Edu? --      Excl. in GC? --    No data found.  Updated Vital Signs BP 128/70 (BP Location: Right Arm)   Pulse 72   Temp 98.3 F (36.8 C) (Oral)   Resp 18   LMP 06/23/2018   SpO2 98%   Visual Acuity Right Eye Distance:   Left Eye Distance:   Bilateral Distance:    Right Eye Near:   Left Eye Near:    Bilateral Near:     Physical Exam Constitutional:      General: She is not in acute distress.    Appearance: Normal appearance. She is not ill-appearing, toxic-appearing or diaphoretic.  HENT:     Head: Normocephalic and atraumatic.     Nose: Nose normal.     Mouth/Throat:     Pharynx: Oropharynx is clear.  Eyes:     General: Lids are normal.        Right eye: No foreign body, discharge or hordeolum.        Left eye: No foreign body, discharge or hordeolum.     Extraocular Movements: Extraocular movements intact.     Conjunctiva/sclera: Conjunctivae normal.     Right eye:  Right conjunctiva is not injected. No chemosis, exudate or hemorrhage.    Left eye: Left conjunctiva is not injected. No chemosis, exudate or hemorrhage.    Pupils: Pupils are equal, round, and reactive to light.  Neck:     Musculoskeletal: Normal range of motion.  Pulmonary:     Effort: Pulmonary effort is normal.  Musculoskeletal: Normal range of motion.  Skin:    General: Skin is warm and dry.  Neurological:     Mental Status: She is alert.  Psychiatric:        Mood and Affect: Mood normal.      UC Treatments / Results  Labs (all labs ordered are listed, but only abnormal results are displayed) Labs Reviewed - No data to display  EKG None  Radiology No results found.  Procedures Procedures (including critical care time)  Medications Ordered in UC Medications - No data to display  Initial Impression / Assessment and Plan / UC Course  I have reviewed the triage vital signs and the nursing notes.  Pertinent labs & imaging results that were available during my care of the patient were reviewed by me and considered in my medical decision making (see chart for details).    Allergic conjunctivitis of both eyes Exam normal. Most likely patient symptoms are coming from allergies or could be irritation from the fake eyelashes. We will try Pataday eyedrops and Zyrtec for symptoms Follow up as needed for continued or worsening symptoms  Final Clinical Impressions(s) / UC Diagnoses   Final diagnoses:  Allergic conjunctivitis of both eyes     Discharge Instructions     Try the allergy drops and zyrtec daily Follow up as needed for continued or worsening symptoms     ED Prescriptions    Medication Sig Dispense Auth. Provider   Olopatadine HCl 0.2 % SOLN Apply 1 drop to eye once for 1 dose. 1  Bottle Radiah Lubinski A, NP   cetirizine (ZYRTEC) 10 MG tablet Take 1 tablet (10 mg total) by mouth daily. 30 tablet Dahlia ByesBast, Kashmir Leedy A, NP     Controlled Substance Prescriptions  [place] Controlled Substance Registry consulted? Not Applicable   Janace ArisBast, Dameion Briles A, NP 06/28/18 1108

## 2018-09-24 ENCOUNTER — Other Ambulatory Visit: Payer: Self-pay

## 2018-09-24 ENCOUNTER — Emergency Department (HOSPITAL_COMMUNITY)
Admission: EM | Admit: 2018-09-24 | Discharge: 2018-09-24 | Disposition: A | Discharge status: Home / Self Care | Payer: Medicaid Other | Attending: Emergency Medicine | Admitting: Emergency Medicine

## 2018-09-24 ENCOUNTER — Encounter (HOSPITAL_COMMUNITY): Payer: Self-pay | Admitting: Emergency Medicine

## 2018-09-24 DIAGNOSIS — H1013 Acute atopic conjunctivitis, bilateral: Secondary | ICD-10-CM | POA: Insufficient documentation

## 2018-09-24 DIAGNOSIS — F17210 Nicotine dependence, cigarettes, uncomplicated: Secondary | ICD-10-CM | POA: Diagnosis not present

## 2018-09-24 MED ORDER — FLUORESCEIN SODIUM 1 MG OP STRP
1.0000 | ORAL_STRIP | Freq: Once | OPHTHALMIC | Status: AC
  Administered 2018-09-24: 1 via OPHTHALMIC
  Filled 2018-09-24: qty 1

## 2018-09-24 MED ORDER — TETRACAINE HCL 0.5 % OP SOLN
2.0000 [drp] | Freq: Once | OPHTHALMIC | Status: AC
  Administered 2018-09-24: 2 [drp] via OPHTHALMIC
  Filled 2018-09-24: qty 4

## 2018-09-24 MED ORDER — OLOPATADINE HCL 0.2 % OP SOLN: 1.0000 [drp] | Freq: Every day | OPHTHALMIC | 0 refills | Status: DC

## 2018-09-24 NOTE — ED Triage Notes (Signed)
Patient presents to the ED for " eye infection" reports has notice "pus" in the bottom of bilateral eyes.  She reports started 04/2018. She reports was seen at urgent care given a Rx for eye drops reports did not take because she could not get them. She reports pain in her eyes. She reports blurry vision.

## 2018-09-24 NOTE — ED Provider Notes (Signed)
[facility] Provider Note   CSN: [id number] Arrival date & time: 09/24/18  1114    History   Chief Complaint Chief Complaint  Patient presents with  . Conjunctivitis    HPI Evelyn [name] is a 28 y.o. female with PMHx eczema who presents to the ED today complaining of bilateral eye drainage and itching x "months", recently worsening. Pt reports that her vision gets blurry because of the discharge. She has also been rubbing at her eyes excessively causing pain to them after rubbing. Pt carries a wet rag with her at all times to wipe at her eyes. She reports she was seen at urgent care and given eye drops but never picked them up because of monetary issues. Denies fever, chills, sore throat, post nasal drainage, cough.         Past Medical History:  Diagnosis Date  . Anemia   . Eczema   . Prior pregnancy with fetal demise    weeks 38 gestation fetal demise  . Sickle cell trait (HCC)   . SVD (spontaneous vaginal delivery)    x 4     Patient Active Problem List   Diagnosis Date Noted  . Bacterial vaginitis 12/25/2016  . Missed ab 07/15/2016  . Sickle cell trait [facility](HCC)     Past Surgical History:  Procedure Laterality Date  . DILATION AND CURETTAGE OF UTERUS    . DILATION AND EVACUATION N/A 07/16/2016   Procedure: DILATATION AND EVACUATION (D&E) 2ND TRIMESTER;  Surgeon: Reva BoresPratt, Tanya S, MD;  Location: [facility];  Service: Gynecology;  Laterality: N/A;  . THERAPEUTIC ABORTION       OB History    Gravida  7   Para  4   Term  4   Preterm      AB  3   Living  3     SAB  1   TAB  2   Ectopic      Multiple  0   Live Births  4        Obstetric Comments  TAB, 09/17/17         Home Medications    Prior to Admission medications   Medication Sig Start Date End Date Taking? Authorizing Provider  albuterol (PROVENTIL) (2.5 MG/3ML) 0.083% nebulizer solution Take 2.5 mg by nebulization every 6 (six) hours as needed for  wheezing or shortness of breath.    [provider]  cetirizine (ZYRTEC) 10 MG tablet Take 1 tablet (10 mg total) by mouth daily. 06/27/18   Dahlia ByesBast, Traci A, NP  ibuprofen (ADVIL,MOTRIN) 600 MG tablet Take 1 tablet (600 mg total) by mouth every 6 (six) hours as needed for moderate pain. 10/08/17   Donette LarryBhambri, Melanie, CNM  Olopatadine HCl 0.2 % SOLN Apply 1 drop to eye daily. 1 drop in affected eyes once daily 09/24/18   Tanda RockersVenter, Johnica Armwood, PA-C    Family History Family History  Problem Relation Age of Onset  . Hypertension Father     Social History Social History   Tobacco Use  . Smoking status: Current Every Day Smoker    Packs/day: 0.25    Years: 7.00    Pack years: 1.75    Types: Cigars  . Smokeless tobacco: Never Used  Substance Use Topics  . Alcohol use: No  . Drug use: No     Allergies   Patient has no known allergies.   Review of Systems Review of Systems  Constitutional: Negative for chills and fever.  HENT: Negative for congestion, ear pain, facial swelling, postnasal drip, sinus pressure, sinus pain, sneezing and sore throat.   Eyes: Positive for discharge, itching and visual disturbance.  Respiratory: Negative for cough.   Skin: Negative for rash.     Physical Exam Updated Vital Signs BP 127/73 (BP Location: Right Arm)   Pulse 73   Temp 98.4 F (36.9 C) (Oral)   Resp 18   Ht 5\' 7"  (1.702 m)   Wt 77.1 kg   LMP 09/10/2018   SpO2 98%   BMI 26.63 kg/m   Physical Exam Vitals signs and nursing note reviewed.  Constitutional:      Appearance: She is not ill-appearing.  HENT:     Head: Normocephalic and atraumatic.  Eyes:     General: Lids are normal.        Right eye: No foreign body.        Left eye: No foreign body.     Intraocular pressure: Right eye pressure is 20 mmHg. Left eye pressure is 17 mmHg.     Extraocular Movements: Extraocular movements intact.     Conjunctiva/sclera: Conjunctivae normal.     Right eye: Right conjunctiva is not  injected.     Left eye: Left conjunctiva is not injected.     Pupils: Pupils are equal, round, and reactive to light.     Right eye: No corneal abrasion or fluorescein uptake.     Left eye: No corneal abrasion or fluorescein uptake.     Comments: Visual Acuity R Near:20/20 R Distance:20/20 L Near:20/20 L Distance:20/20  Cardiovascular:     Rate and Rhythm: Normal rate and regular rhythm.  Pulmonary:     Effort: Pulmonary effort is normal.     Breath sounds: Normal breath sounds.  Skin:    General: Skin is warm and dry.     Coloration: Skin is not jaundiced.  Neurological:     Mental Status: She is alert.      ED Treatments / Results  Labs (all labs ordered are listed, but only abnormal results are displayed) Labs Reviewed - No data to display  EKG None  Radiology No results found.  Procedures Procedures (including critical care time)  Medications Ordered in ED Medications  fluorescein ophthalmic strip 1 strip (1 strip Both Eyes Given 09/24/18 1201)  tetracaine (PONTOCAINE) 0.5 % ophthalmic solution 2 drop (2 drops Both Eyes Given 09/24/18 1201)     Initial Impression / Assessment and Plan / ED Course  I have reviewed the triage vital signs and the nursing notes.  Pertinent labs & imaging results that were available during my care of the patient were reviewed by me and considered in my medical decision making (see chart for details).    28 year old female presenting to the ED with bilateral eye itchiness and drainage. No crusting of eyes in the morning. No pain with EOM. No periorbital swelling to suggest periorbital vs orbital cellulitis. [facility] review [facility]; diagnosed with allergic conjunctivitis and prescribed zyrtec as well as olapatadine. Pt states she was never told to take zyrtec and did not pick up the eye drops. Per my assessment today this sounds allergic in nature as well; given clear discharge and bilateral itching. Pt also  has hx of eczema. She does not seem to agree with assessment. She would like eyes thoroughly checked out today; will check for corneal abrasions although do not suspect any findings today. Will also do  visual acuity screen given pt's complaints of "blurry vision due to discharge." Advised patient that she will need to take antihistamines to help with this. Will try to find cheaper option for eye drops.   Visual acuity 20/20. No findings with wood's lamp exam. Advised patient again that this is likely allergic conjunctivitis and she should take antihistamines daily. Good Rx coupon printed for pt for olopatadine drops and sent to appropriate pharmacy for pt to pick up. She is advised to follow up with her PCP. She is in agreement with plan at this time and stable for discharge home.        Final Clinical Impressions(s) / ED Diagnoses   Final diagnoses:  Allergic conjunctivitis of both eyes    ED Discharge Orders         Ordered    Olopatadine HCl 0.2 % SOLN  Daily     09/24/18 1230           Eustaquio Maize, PA-C 09/24/18 1804    Hayden Rasmussen, MD 09/25/18 1024

## 2018-09-24 NOTE — Discharge Instructions (Signed)
Please use eye drops as prescribed Please also take Zyrtec daily as this will help with the allergic aspect Follow up with  your PCP. If you do not have one you can follow up with [facility].

## 2018-11-01 ENCOUNTER — Encounter (HOSPITAL_COMMUNITY): Payer: Self-pay | Admitting: Emergency Medicine

## 2018-11-01 ENCOUNTER — Other Ambulatory Visit: Payer: Self-pay

## 2018-11-01 ENCOUNTER — Emergency Department (HOSPITAL_COMMUNITY)
Admission: EM | Admit: 2018-11-01 | Discharge: 2018-11-01 | Disposition: A | Discharge status: Home / Self Care | Payer: Medicaid Other | Attending: Emergency Medicine | Admitting: Emergency Medicine

## 2018-11-01 DIAGNOSIS — F17290 Nicotine dependence, other tobacco product, uncomplicated: Secondary | ICD-10-CM | POA: Insufficient documentation

## 2018-11-01 DIAGNOSIS — Y92410 Unspecified street and highway as the place of occurrence of the external cause: Secondary | ICD-10-CM | POA: Diagnosis not present

## 2018-11-01 DIAGNOSIS — M549 Dorsalgia, unspecified: Secondary | ICD-10-CM | POA: Diagnosis not present

## 2018-11-01 DIAGNOSIS — Y999 Unspecified external cause status: Secondary | ICD-10-CM | POA: Insufficient documentation

## 2018-11-01 DIAGNOSIS — M7918 Myalgia, other site: Secondary | ICD-10-CM

## 2018-11-01 DIAGNOSIS — Z79899 Other long term (current) drug therapy: Secondary | ICD-10-CM | POA: Insufficient documentation

## 2018-11-01 DIAGNOSIS — Y9389 Activity, other specified: Secondary | ICD-10-CM | POA: Insufficient documentation

## 2018-11-01 DIAGNOSIS — M542 Cervicalgia: Secondary | ICD-10-CM | POA: Diagnosis present

## 2018-11-01 LAB — I-STAT BETA HCG BLOOD, ED (MC, WL, AP ONLY): I-stat hCG, quantitative: <5 m[IU]/mL (ref <5)

## 2018-11-01 MED ORDER — CYCLOBENZAPRINE HCL 5 MG PO TABS: 5.0000 mg | ORAL_TABLET | Freq: Two times a day (BID) | ORAL | 0 refills | Status: DC | PRN
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
## 2018-11-01 NOTE — Discharge Instructions (Signed)
Please read attached information. If you experience any new or worsening signs or symptoms please return to the emergency room for evaluation. Please follow-up with your primary care provider or specialist as discussed. Please use medication prescribed only as directed and discontinue taking if you have any concerning signs or symptoms.   °

## 2018-11-01 NOTE — ED Notes (Signed)
All appropriate discharge materials reviewed at length with patient. Time for questions provided. Pt has no other questions at this time and verbalizes understanding of all provided materials.  

## 2018-12-11 ENCOUNTER — Encounter (HOSPITAL_COMMUNITY): Payer: Self-pay | Admitting: Emergency Medicine

## 2018-12-11 ENCOUNTER — Other Ambulatory Visit: Payer: Self-pay

## 2018-12-11 ENCOUNTER — Ambulatory Visit (HOSPITAL_COMMUNITY)
Admission: EM | Admit: 2018-12-11 | Discharge: 2018-12-11 | Disposition: A | Discharge status: Home / Self Care | Payer: Medicaid Other | Attending: Emergency Medicine | Admitting: Emergency Medicine

## 2018-12-11 DIAGNOSIS — N938 Other specified abnormal uterine and vaginal bleeding: Secondary | ICD-10-CM | POA: Diagnosis present

## 2018-12-11 DIAGNOSIS — Z3202 Encounter for pregnancy test, result negative: Secondary | ICD-10-CM | POA: Diagnosis not present

## 2018-12-11 LAB — CBC
HCT: 43.6 % (ref 36.0–46.0)
Hemoglobin: 14.4 g/dL (ref 12.0–15.0)
MCH: 25.9 pg — ABNORMAL LOW (ref 26.0–34.0)
MCHC: 33 g/dL (ref 30.0–36.0)
MCV: 78.4 fL — ABNORMAL LOW (ref 80.0–100.0)
Platelets: 322 10*3/uL (ref 150–400)
RBC: 5.56 MIL/uL — ABNORMAL HIGH (ref 3.87–5.11)
RDW: 14.5 % (ref 11.5–15.5)
WBC: 4.8 10*3/uL (ref 4.0–10.5)
nRBC: 0 % (ref 0.0–0.2)

## 2018-12-11 LAB — POCT PREGNANCY, URINE: Preg Test, Ur: NEGATIVE

## 2018-12-11 NOTE — Discharge Instructions (Addendum)
I will call you if your CBC comes back dangerously low.  If you do not hear from me by this evening, then you can assume that everything is okay.  You need to have a GYN evaluation as soon as you possibly can.   [place] for [employer] at [facility]       Phone: [phone]  [place] for [employer] at [place]   Phone: [place] for [employer] at [place]  Phone: [place] for [place] at [employer]  Phone: [place] for [place] at [place]  Phone: [place] for [place] at [facility]   Phone: [facility]       Phone: [phone]  [facility]    Phone: [phone]   [facility]    Phone: [phone]  [facility]    Phone: [place]    Phone: [phone]  [place] [facility]       Phone: [phone]   [place] [facility]  Phone: [place]    Phone: [phone]  [facility]   Phone: [phone]  [facility]      Phone: [phone]  [facility]    Phone: [phone]

## 2018-12-11 NOTE — ED Triage Notes (Signed)
Pt here for irregular vaginal bleeding x 2 months

## 2018-12-11 NOTE — ED Provider Notes (Signed)
HPI  SUBJECTIVE:  Evelyn [name] is a 28 y.o. female who presents with irregular vaginal bleeding for 2 months.  She states that she has been spotting for the past 3 days, and that it is "too early" for her. States that her period has been very regular up until 2 months ago.  She states that she was going through 2 pads per hour in the first day, and it is now slowing down to where she does not need pads anymore. she reports low midline abdominal crampy pain and back pain before the vaginal bleeding started, this has resolved.  No fevers, chest pain, shortness of breath, lightheadedness, palpitations, syncope.  No pelvic pain, vaginal odor, discharge.  She is in a long-term monogamous relationship with a female who is asymptomatic.  STDs are not a concern today.  She denies dyspareunia.  No aggravating or alleviating factors.  She has not tried anything for this.  She has a past medical history of chlamydia, BV.  No history of gonorrhea, HIV, HSV, syphilis, trichomonas, yeast, coagulopathy, thrombocytopenia, anemia, uterine fibroids.  She is not on anything for birth control.  OB/GYN: None.    Past Medical History:  Diagnosis Date  . Anemia   . Eczema   . Prior pregnancy with fetal demise    weeks 38 gestation fetal demise  . Sickle cell trait (HCC)   . SVD (spontaneous vaginal delivery)    x 4     Past Surgical History:  Procedure Laterality Date  . DILATION AND CURETTAGE OF UTERUS    . DILATION AND EVACUATION N/A 07/16/2016   Procedure: DILATATION AND EVACUATION (D&E) 2ND TRIMESTER;  Surgeon: Reva Bores, MD;  Location: [facility];  Service: Gynecology;  Laterality: N/A;  . THERAPEUTIC ABORTION      Family History  Problem Relation Age of Onset  . Hypertension Father     Social History   Tobacco Use  . Smoking status: Current Every Day Smoker    Packs/day: 0.25    Years: 7.00    Pack years: 1.75    Types: Cigars  . Smokeless tobacco: Never Used  Substance Use Topics  . Alcohol  use: No  . Drug use: No    No current facility-administered medications for this encounter.   Current Outpatient Medications:  .  albuterol (PROVENTIL) (2.5 MG/3ML) 0.083% nebulizer solution, Take 2.5 mg by nebulization every 6 (six) hours as needed for wheezing or shortness of breath., Disp: , Rfl:  .  cetirizine (ZYRTEC) 10 MG tablet, Take 1 tablet (10 mg total) by mouth daily., Disp: 30 tablet, Rfl: 0 .  ibuprofen (ADVIL,MOTRIN) 600 MG tablet, Take 1 tablet (600 mg total) by mouth every 6 (six) hours as needed for moderate pain., Disp: 30 tablet, Rfl: 0  No Known Allergies   ROS  As noted in HPI.   Physical Exam  BP 118/71 (BP Location: Right Arm)   Pulse 69   Temp 97.6 F (36.4 C) (Oral)   Resp 18   SpO2 100%   Constitutional: Well developed, well nourished, no acute distress Eyes:  EOMI, conjunctiva normal bilaterally HENT: Normocephalic, atraumatic,mucus membranes moist Respiratory: Normal inspiratory effort Cardiovascular: Normal rate GI: nondistended, soft, nontender, no suprapubic, flank tenderness. Back: No CVAT. skin: No rash, skin intact Musculoskeletal: no deformities Neurologic: Alert & oriented x 3, no focal neuro deficits Psychiatric: Speech and behavior appropriate   ED Course   Medications - No data to display  Orders Placed This Encounter  Procedures  .  CBC    Standing Status:   Standing    Number of Occurrences:   1  . POC urine pregnancy    Standing Status:   Standing    Number of Occurrences:   1  . Pregnancy, urine POC    Standing Status:   Standing    Number of Occurrences:   1    Results for orders placed or performed during the hospital encounter of 12/11/18 (from the past 24 hour(s))  CBC     Status: Abnormal   Collection Time: 12/11/18  4:02 PM  Result Value Ref Range   WBC 4.8 4.0 - 10.5 K/uL   RBC 5.56 (H) 3.87 - 5.11 MIL/uL   Hemoglobin 14.4 12.0 - 15.0 g/dL   HCT 43.6 36.0 - 46.0 %   MCV 78.4 (L) 80.0 - 100.0 fL   MCH  25.9 (L) 26.0 - 34.0 pg   MCHC 33.0 30.0 - 36.0 g/dL   RDW 14.5 11.5 - 15.5 %   Platelets 322 150 - 400 K/uL   nRBC 0.0 0.0 - 0.2 %  Pregnancy, urine POC     Status: None   Collection Time: 12/11/18  4:05 PM  Result Value Ref Range   Preg Test, Ur NEGATIVE NEGATIVE   No results found.  ED Clinical Impression  1. Dysfunctional uterine bleeding      ED Assessment/Plan  She is not pregnant.  She states that the vaginal bleeding is slowing down significantly.  She states that she is only using a pad intermittently now.  She has no abdominal, back or pelvic pain or any other vaginal complaints.  Pelvic deferred today.  Will refer to GYN for further work-up.  Will provide work note for today.    CBC pending.   Hemoglobin/hematocrit 14.4/43.6.  Has not changed since January of this year where was 14.3/44.0.  Discussed labs, MDM, treatment plan, and plan for follow-up with patient. Discussed sn/sx that should prompt return to the ED. patient agrees with plan.   No orders of the defined types were placed in this encounter.   *This clinic note was created using Dragon dictation software. Therefore, there may be occasional mistakes despite careful proofreading.   ?    Melynda Ripple, MD 12/12/18 1248

## 2019-03-09 NOTE — L&D Delivery Note (Addendum)
Delivery Note Called to room and patient was complete with a constant urge to push. Head delivered. Loose nuchal cord x1, reducible. Shoulder and body delivered in usual fashion. At 2:47 AM a viable female was delivered via Vaginal, Spontaneous (Presentation: Left Occiput Anterior). Infant with spontaneous cry, placed on mother's abdomen, dried and stimulated. Cord clamped x 2 after 1-minute delay, and cut by FOB. Cord blood drawn. Placenta delivered spontaneously with gentle cord traction. Appears intact. Fundus firm with massage and Pitocin. Labia, perineum, vagina, and cervix inspected.   APGAR: 8, 9; weight 2994 g .   Placenta status: Spontaneous, Intact.  Cord: 3 vessels with the following complications: None.    Anesthesia: Epidural Episiotomy: None Lacerations: None Suture Repair:  n/a Est. Blood Loss (mL): 50  Mom to postpartum.  Baby to Couplet care / Skin to Skin.  Sande Rives 01/25/2020, 3:55 AM The above was performed under my direct supervision and guidance.

## 2019-05-09 ENCOUNTER — Encounter (HOSPITAL_COMMUNITY): Payer: Self-pay | Admitting: Urgent Care

## 2019-05-09 ENCOUNTER — Ambulatory Visit (HOSPITAL_COMMUNITY)
Admission: EM | Admit: 2019-05-09 | Discharge: 2019-05-09 | Disposition: A | Discharge status: Home / Self Care | Payer: Medicaid Other | Attending: Urgent Care | Admitting: Urgent Care

## 2019-05-09 ENCOUNTER — Other Ambulatory Visit: Payer: Self-pay

## 2019-05-09 DIAGNOSIS — Z3202 Encounter for pregnancy test, result negative: Secondary | ICD-10-CM | POA: Diagnosis not present

## 2019-05-09 DIAGNOSIS — R319 Hematuria, unspecified: Secondary | ICD-10-CM | POA: Diagnosis present

## 2019-05-09 DIAGNOSIS — K5900 Constipation, unspecified: Secondary | ICD-10-CM | POA: Insufficient documentation

## 2019-05-09 DIAGNOSIS — R21 Rash and other nonspecific skin eruption: Secondary | ICD-10-CM | POA: Diagnosis present

## 2019-05-09 DIAGNOSIS — M545 Low back pain, unspecified: Secondary | ICD-10-CM

## 2019-05-09 DIAGNOSIS — L309 Dermatitis, unspecified: Secondary | ICD-10-CM | POA: Insufficient documentation

## 2019-05-09 LAB — POCT URINALYSIS DIP (DEVICE)
Bilirubin Urine: NEGATIVE
Glucose, UA: NEGATIVE mg/dL
Ketones, ur: NEGATIVE mg/dL
Leukocytes,Ua: NEGATIVE
Nitrite: NEGATIVE
Protein, ur: NEGATIVE mg/dL
Specific Gravity, Urine: 1.025 (ref 1.005–1.030)
Urobilinogen, UA: 0.2 mg/dL (ref 0.0–1.0)
pH: 6 (ref 5.0–8.0)

## 2019-05-09 LAB — POCT PREGNANCY, URINE: Preg Test, Ur: NEGATIVE

## 2019-05-09 LAB — POC URINE PREG, ED: Preg Test, Ur: NEGATIVE

## 2019-05-09 MED ORDER — PREDNISONE 20 MG PO TABS: ORAL_TABLET | ORAL | 0 refills | Status: DC

## 2019-05-09 MED ORDER — TIZANIDINE HCL 4 MG PO TABS: 4.0000 mg | ORAL_TABLET | Freq: Three times a day (TID) | ORAL | 0 refills | Status: DC | PRN

## 2019-05-09 MED ORDER — HYDROXYZINE HCL 25 MG PO TABS: 12.5000 mg | ORAL_TABLET | Freq: Three times a day (TID) | ORAL | 0 refills | Status: DC | PRN

## 2019-05-09 NOTE — Discharge Instructions (Addendum)
You may take 500mg -650mg  Tylenol every 6 hours for pain, back aches.

## 2019-05-09 NOTE — ED Provider Notes (Signed)
[facility]   MRN: [id number] DOB: 12-14-1990  Subjective:   Evelyn [name] is a 29 y.o. female presenting for 3-day history of persistent and progressively worsening bilateral low back pain, hematuria.  Patient also feels bloated, has a very itchy rash over her torso and arms.  States that she feels overall general malaise.  Has a history of bad eczema, has been using her daughter triamcinolone cream with minimal relief.  Patient denies fever, chest pain, shortness of breath, abdominal pain, dysuria, urinary frequency, urinary urgency, vaginal discharge.  Patient does not have concern for STI.  Regarding hydration, states that she does not drink water, drinks soda or sweet tea.  No current facility-administered medications for this encounter.  Current Outpatient Medications:  .  albuterol (PROVENTIL) (2.5 MG/3ML) 0.083% nebulizer solution, Take 2.5 mg by nebulization every 6 (six) hours as needed for wheezing or shortness of breath., Disp: , Rfl:  .  cetirizine (ZYRTEC) 10 MG tablet, Take 1 tablet (10 mg total) by mouth daily., Disp: 30 tablet, Rfl: 0 .  ibuprofen (ADVIL,MOTRIN) 600 MG tablet, Take 1 tablet (600 mg total) by mouth every 6 (six) hours as needed for moderate pain., Disp: 30 tablet, Rfl: 0   No Known Allergies  Past Medical History:  Diagnosis Date  . Anemia   . Eczema   . Prior pregnancy with fetal demise    weeks 38 gestation fetal demise  . Sickle cell trait (HCC)   . SVD (spontaneous vaginal delivery)    x 4      Past Surgical History:  Procedure Laterality Date  . DILATION AND CURETTAGE OF UTERUS    . DILATION AND EVACUATION N/A 07/16/2016   Procedure: DILATATION AND EVACUATION (D&E) 2ND TRIMESTER;  Surgeon: Reva Bores, MD;  Location: [facility];  Service: Gynecology;  Laterality: N/A;  . THERAPEUTIC ABORTION      Family History  Problem Relation Age of Onset  . Hypertension Father     Social History   Tobacco Use  . Smoking status: Current  Every Day Smoker    Packs/day: 0.25    Years: 7.00    Pack years: 1.75    Types: Cigars  . Smokeless tobacco: Never Used  Substance Use Topics  . Alcohol use: No  . Drug use: No    ROS   Objective:   Vitals: BP 108/69   Pulse 74   Temp 98.4 F (36.9 C)   Resp 17   LMP 04/25/2019   SpO2 98%   Physical Exam Constitutional:      General: She is not in acute distress.    Appearance: Normal appearance. She is well-developed and normal weight. She is not ill-appearing, toxic-appearing or diaphoretic.  HENT:     Head: Normocephalic and atraumatic.     Right Ear: External ear normal.     Left Ear: External ear normal.     Nose: Nose normal.     Mouth/Throat:     Mouth: Mucous membranes are moist.     Pharynx: Oropharynx is clear.  Eyes:     General: No scleral icterus.       Right eye: No discharge.        Left eye: No discharge.     Extraocular Movements: Extraocular movements intact.     Pupils: Pupils are equal, round, and reactive to light.  Cardiovascular:     Rate and Rhythm: Normal rate and regular rhythm.     Heart sounds: Normal heart sounds. No  murmur. No friction rub. No gallop.   Pulmonary:     Effort: Pulmonary effort is normal. No respiratory distress.     Breath sounds: Normal breath sounds. No stridor. No wheezing, rhonchi or rales.  Abdominal:     General: Bowel sounds are normal. There is no distension.     Palpations: Abdomen is soft. There is no mass.     Tenderness: There is no abdominal tenderness. There is no right CVA tenderness, left CVA tenderness, guarding or rebound.  Skin:    General: Skin is warm and dry.     Coloration: Skin is not pale.     Findings: Rash (Eczematous rash of her flexural surfaces of her forearms extending to the lateral portions of her arms, dorsal aspect of her forearms and around her neck) present.  Neurological:     General: No focal deficit present.     Mental Status: She is alert and oriented to person, place,  and time.  Psychiatric:        Mood and Affect: Mood normal.        Behavior: Behavior normal.        Thought Content: Thought content normal.        Judgment: Judgment normal.     Results for orders placed or performed during the hospital encounter of 05/09/19 (from the past 24 hour(s))  POCT urinalysis dip (device)     Status: Abnormal   Collection Time: 05/09/19  7:32 PM  Result Value Ref Range   Glucose, UA NEGATIVE NEGATIVE mg/dL   Bilirubin Urine NEGATIVE NEGATIVE   Ketones, ur NEGATIVE NEGATIVE mg/dL   Specific Gravity, Urine 1.025 1.005 - 1.030   Hgb urine dipstick LARGE (A) NEGATIVE   pH 6.0 5.0 - 8.0   Protein, ur NEGATIVE NEGATIVE mg/dL   Urobilinogen, UA 0.2 0.0 - 1.0 mg/dL   Nitrite NEGATIVE NEGATIVE   Leukocytes,Ua NEGATIVE NEGATIVE  POC urine pregnancy     Status: None   Collection Time: 05/09/19  7:34 PM  Result Value Ref Range   Preg Test, Ur NEGATIVE NEGATIVE  Pregnancy, urine POC     Status: None   Collection Time: 05/09/19  7:34 PM  Result Value Ref Range   Preg Test, Ur NEGATIVE NEGATIVE    Assessment and Plan :   1. Acute midline low back pain without sciatica   2. Rash and nonspecific skin eruption   3. Eczema, unspecified type   4. Constipation, unspecified constipation type   5. Hematuria, unspecified type     Counseled patient on need for hydration with plain water.  I suspect that this would be a major source of her systemic symptoms including a flareup of her eczema.  We will have her use of prednisone given diffusely scattered it is.  Recommended avoiding urinary irritants.  Use Tylenol for aches and pains, tizanidine.  Urine culture pending.  Physical exam findings and vital signs otherwise stable for discharge. Counseled patient on potential for adverse effects with medications prescribed/recommended today, ER and return-to-clinic precautions discussed, patient verbalized understanding.    Jaynee Eagles, PA-C 05/10/19 1011

## 2019-05-09 NOTE — ED Triage Notes (Signed)
Pt presents with complaints of lower back pain and hematuria x 3 days. Pt also reports "skin break outs everywhere" that is itchy.

## 2019-05-10 LAB — URINE CULTURE: Culture: 60000 — AB

## 2019-06-11 ENCOUNTER — Ambulatory Visit (HOSPITAL_COMMUNITY)
Admission: EM | Admit: 2019-06-11 | Discharge: 2019-06-11 | Disposition: A | Discharge status: Home / Self Care | Payer: Medicaid Other | Attending: Physician Assistant | Admitting: Physician Assistant

## 2019-06-11 ENCOUNTER — Other Ambulatory Visit: Payer: Self-pay

## 2019-06-11 ENCOUNTER — Encounter (HOSPITAL_COMMUNITY): Payer: Self-pay

## 2019-06-11 DIAGNOSIS — R110 Nausea: Secondary | ICD-10-CM

## 2019-06-11 DIAGNOSIS — K582 Mixed irritable bowel syndrome: Secondary | ICD-10-CM

## 2019-06-11 DIAGNOSIS — Z3490 Encounter for supervision of normal pregnancy, unspecified, unspecified trimester: Secondary | ICD-10-CM

## 2019-06-11 DIAGNOSIS — Z3201 Encounter for pregnancy test, result positive: Secondary | ICD-10-CM

## 2019-06-11 LAB — POCT URINALYSIS DIP (DEVICE)
Bilirubin Urine: NEGATIVE
Glucose, UA: NEGATIVE mg/dL
Hgb urine dipstick: NEGATIVE
Ketones, ur: NEGATIVE mg/dL
Leukocytes,Ua: NEGATIVE
Nitrite: NEGATIVE
Protein, ur: NEGATIVE mg/dL
Specific Gravity, Urine: 1.025 (ref 1.005–1.030)
Urobilinogen, UA: 0.2 mg/dL (ref 0.0–1.0)
pH: 6 (ref 5.0–8.0)

## 2019-06-11 LAB — POCT PREGNANCY, URINE: Preg Test, Ur: POSITIVE — AB

## 2019-06-11 LAB — POC URINE PREG, ED: Preg Test, Ur: POSITIVE — AB

## 2019-06-11 MED ORDER — DOXYLAMINE-PYRIDOXINE 10-10 MG PO TBEC: 1.0000 | DELAYED_RELEASE_TABLET | Freq: Every morning | ORAL | 0 refills | Status: DC

## 2019-06-11 MED ORDER — DICYCLOMINE HCL 20 MG PO TABS: 20.0000 mg | ORAL_TABLET | Freq: Two times a day (BID) | ORAL | 0 refills | Status: DC

## 2019-06-11 MED ORDER — ONDANSETRON HCL 4 MG PO TABS: 4.0000 mg | ORAL_TABLET | Freq: Four times a day (QID) | ORAL | 0 refills | Status: DC

## 2019-06-11 NOTE — ED Triage Notes (Addendum)
Pt c/o 8/10 sharp mid abdominal pain that radiate to back and it started a month ago. She's been having diarrhea off and on for a month. Pt has nausea and decreased appetite.

## 2019-06-11 NOTE — Discharge Instructions (Addendum)
Your pregnancy test was positive. Please call the [facility] first thing in the morning for evaluation   If you develop lower abdominal pain or vaginal bleeding prior to evaluation please been seen in the [facility].  Follow the lowfod maps as I also believe you have IBS symptoms.

## 2019-06-11 NOTE — ED Provider Notes (Signed)
[facility]    CSN: [id number] Arrival date & time: 06/11/19  1933      History   Chief Complaint Chief Complaint  Patient presents with  . Abdominal Pain    HPI Evelyn [name] is a 29 y.o. female.   Patient presents for evaluation of chronic abdominal pain is been present for 6 months, on and off constipation with diarrhea.  She reports for at least 6 months she has had mid abdominal pain that feels like cramping thank you.  She does report that when she does have bowel movements there is some improvement in her abdominal pain.  The pain is sometimes described as a menstrual cramp.  She reports having intermittent constipation where she will have a bowel movement for 2 to 3 days and then will have a bowel movement followed by intermittent loose stools for a few days.  She reports this is been going on for several months.  She reports no drastic changes in this habit.  She denies any blood or dark color stool.  She does report some nausea and decreased appetite as well.  She denies any vomiting.  She reports her appetite has been decreased due to nausea and occasionally feeling full early with food.  She believes that this could be related to acid reflux that she will get burning abdominal pain every now and then.  She reports recently she has had some increased fatigue over the last week or so.  Denies upper respiratory symptoms over the last 2 weeks.  She does report she is unsure when her last menstrual period was.  She had a negative pregnancy test in 05/08/2019 however is not had a menstrual cycle since then.  She is unsure of whether she can be pregnant.  She denies any vaginal bleeding or discharge.     Past Medical History:  Diagnosis Date  . Anemia   . Eczema   . Prior pregnancy with fetal demise    weeks 38 gestation fetal demise  . Sickle cell trait (HCC)   . SVD (spontaneous vaginal delivery)    x 4     Patient Active Problem List   Diagnosis Date Noted  .  Bacterial vaginitis 12/25/2016  . Missed ab 07/15/2016  . Sickle cell trait [facility])     Past Surgical History:  Procedure Laterality Date  . DILATION AND CURETTAGE OF UTERUS    . DILATION AND EVACUATION N/A 07/16/2016   Procedure: DILATATION AND EVACUATION (D&E) 2ND TRIMESTER;  Surgeon: Reva Bores, MD;  Location: [facility];  Service: Gynecology;  Laterality: N/A;  . THERAPEUTIC ABORTION      OB History    Gravida  7   Para  4   Term  4   Preterm      AB  3   Living  3     SAB  1   TAB  2   Ectopic      Multiple  0   Live Births  4        Obstetric Comments  TAB, 09/17/17         Home Medications    Prior to Admission medications   Medication Sig Start Date End Date Taking? Authorizing Provider  Doxylamine-Pyridoxine 10-10 MG TBEC Take 1 tablet by mouth in the morning. 06/11/19   Idus Rathke, Veryl Speak, PA-C  hydrOXYzine (ATARAX/VISTARIL) 25 MG tablet Take 0.5-1 tablets (12.5-25 mg total) by mouth every 8 (eight) hours as needed for itching. 05/09/19  Jaynee Eagles, PA-C  predniSONE (DELTASONE) 20 MG tablet Day 1-5: Take 2 tablets daily. Day 6-10: Take 1 tablet daily. Take tablets with breakfast. 05/09/19   Jaynee Eagles, PA-C  tiZANidine (ZANAFLEX) 4 MG tablet Take 1 tablet (4 mg total) by mouth every 8 (eight) hours as needed for muscle spasms. 05/09/19   Jaynee Eagles, PA-C  cetirizine (ZYRTEC) 10 MG tablet Take 1 tablet (10 mg total) by mouth daily. 06/27/18 05/09/19  Loura Halt A, NP  dicyclomine (BENTYL) 20 MG tablet Take 1 tablet (20 mg total) by mouth 2 (two) times daily. 06/11/19 06/11/19  Aurielle Slingerland, Marguerita Beards, PA-C    Family History Family History  Problem Relation Age of Onset  . Hypertension Father     Social History Social History   Tobacco Use  . Smoking status: Current Every Day Smoker    Packs/day: 0.25    Years: 7.00    Pack years: 1.75    Types: Cigars  . Smokeless tobacco: Never Used  Substance Use Topics  . Alcohol use: No  . Drug use: No     Allergies     Patient has no known allergies.   Review of Systems Review of Systems  See HPI Physical Exam Triage Vital Signs ED Triage Vitals  Enc Vitals Group     BP 06/11/19 2008 (!) 124/59     Pulse Rate 06/11/19 2008 79     Resp 06/11/19 2008 16     Temp 06/11/19 2008 99.3 F (37.4 C)     Temp Source 06/11/19 2008 Oral     SpO2 06/11/19 2008 99 %     Weight 06/11/19 2012 155 lb (70.3 kg)     Height 06/11/19 2012 5\' 7"  (1.702 m)     Head Circumference --      Peak Flow --      Pain Score 06/11/19 2009 8     Pain Loc --      Pain Edu? --      Excl. in [place]? --    No data found.  Updated Vital Signs BP (!) 124/59   Pulse 79   Temp 99.3 F (37.4 C) (Oral)   Resp 16   Ht 5\' 7"  (1.702 m)   Wt 155 lb (70.3 kg)   SpO2 99%   BMI 24.28 kg/m   Visual Acuity Right Eye Distance:   Left Eye Distance:   Bilateral Distance:    Right Eye Near:   Left Eye Near:    Bilateral Near:     Physical Exam Vitals and nursing note reviewed.  Constitutional:      General: She is not in acute distress.    Appearance: She is well-developed. She is not ill-appearing.  HENT:     Head: Normocephalic and atraumatic.  Eyes:     Conjunctiva/sclera: Conjunctivae normal.  Cardiovascular:     Rate and Rhythm: Normal rate and regular rhythm.     Heart sounds: No murmur.  Pulmonary:     Effort: Pulmonary effort is normal. No respiratory distress.     Breath sounds: Normal breath sounds.  Abdominal:     General: Bowel sounds are normal. There is no distension.     Palpations: Abdomen is soft.     Tenderness: There is abdominal tenderness in the periumbilical area and left upper quadrant. There is no right CVA tenderness, left CVA tenderness or guarding.     Hernia: No hernia is present.  Musculoskeletal:     Cervical back:  Neck supple.  Skin:    General: Skin is warm and dry.  Neurological:     Mental Status: She is alert.      UC Treatments / Results  Labs (all labs ordered are listed,  but only abnormal results are displayed) Labs Reviewed  POC URINE PREG, ED - Abnormal; Notable for the following components:      Result Value   Preg Test, Ur POSITIVE (*)    All other components within normal limits  POCT PREGNANCY, URINE - Abnormal; Notable for the following components:   Preg Test, Ur POSITIVE (*)    All other components within normal limits  POCT URINALYSIS DIP (DEVICE)    EKG   Radiology No results found.  Procedures Procedures (including critical care time)  Medications Ordered in UC Medications - No data to display  Initial Impression / Assessment and Plan / UC Course  I have reviewed the triage vital signs and the nursing notes.  Pertinent labs & imaging results that were available during my care of the patient were reviewed by me and considered in my medical decision making (see chart for details).     #Pregnancy #Nausea #IBS Patient is a 29 year old female presenting with symptoms consistent with IBS however was found on urinalysis and pregnancy test that she is positive pregnancy test.  UA was unrevealing and blood from previous on 05/09/2019 has resolved.  Though she does have abdominal pain in pregnancy I do believe that the abdominal pain is more related to IBS as opposed to the pregnancy.  We did discuss that the safest thing would have been to go immediately to [facility] center however she reports she will call them first thing in the morning.  I feel this is safe as she has not had vaginal bleeding and does not have a significant abdominal exam for pelvic related pain.  Discussed that we are limited in the ability to treat her symptoms and then will start on Diclegis and recommend a low FODMAPs diet for her likely IBS.  Patient reports she will [facility] for evaluation of the pregnancy.  Discussed that if she were to have severe abdominal pain or vaginal bleeding prior to evaluation that she should report immediately to  the emergency department.  Patient verbalized understanding this plan. Final Clinical Impressions(s) / UC Diagnoses   Final diagnoses:  Irritable bowel syndrome with both constipation and diarrhea  Nausea  Pregnancy, unspecified gestational age     Discharge Instructions     Your pregnancy test was positive. Please call the womens center first thing in the morning for evaluation   If you develop lower abdominal pain or vaginal bleeding prior to evaluation please been seen in the [facility].  Follow the lowfod maps as I also believe you have IBS symptoms.       ED Prescriptions    Medication Sig Dispense Auth. Provider   dicyclomine (BENTYL) 20 MG tablet  (Status: Discontinued) Take 1 tablet (20 mg total) by mouth 2 (two) times daily. 20 tablet Lorrane Mccay, Veryl Speak, PA-C   ondansetron (ZOFRAN) 4 MG tablet  (Status: Discontinued) Take 1 tablet (4 mg total) by mouth every 6 (six) hours. 12 tablet Kiondra Caicedo, Veryl Speak, PA-C   Doxylamine-Pyridoxine 10-10 MG TBEC Take 1 tablet by mouth in the morning. 30 tablet Rose Hippler, Veryl Speak, PA-C     PDMP not reviewed this encounter.   Hermelinda Medicus, PA-C 06/11/19 2105

## 2019-06-17 ENCOUNTER — Other Ambulatory Visit: Payer: Self-pay

## 2019-06-17 ENCOUNTER — Inpatient Hospital Stay (HOSPITAL_COMMUNITY)
Admission: AD | Admit: 2019-06-17 | Discharge: 2019-06-18 | Disposition: A | Discharge status: Home / Self Care | Payer: Medicaid Other | Attending: Obstetrics & Gynecology | Admitting: Obstetrics & Gynecology

## 2019-06-17 ENCOUNTER — Encounter (HOSPITAL_COMMUNITY): Payer: Self-pay | Admitting: Obstetrics & Gynecology

## 2019-06-17 DIAGNOSIS — Z3A01 Less than 8 weeks gestation of pregnancy: Secondary | ICD-10-CM | POA: Insufficient documentation

## 2019-06-17 DIAGNOSIS — O211 Hyperemesis gravidarum with metabolic disturbance: Secondary | ICD-10-CM | POA: Diagnosis not present

## 2019-06-17 DIAGNOSIS — O210 Mild hyperemesis gravidarum: Secondary | ICD-10-CM | POA: Diagnosis present

## 2019-06-17 DIAGNOSIS — E860 Dehydration: Secondary | ICD-10-CM

## 2019-06-17 DIAGNOSIS — Z87891 Personal history of nicotine dependence: Secondary | ICD-10-CM | POA: Diagnosis not present

## 2019-06-17 LAB — URINALYSIS, ROUTINE W REFLEX MICROSCOPIC
Bilirubin Urine: NEGATIVE
Glucose, UA: NEGATIVE mg/dL
Hgb urine dipstick: NEGATIVE
Ketones, ur: 80 mg/dL — AB
Leukocytes,Ua: NEGATIVE
Nitrite: NEGATIVE
Protein, ur: NEGATIVE mg/dL
Specific Gravity, Urine: 1.013 (ref 1.005–1.030)
pH: 6 (ref 5.0–8.0)

## 2019-06-17 MED ORDER — LACTATED RINGERS IV BOLUS
1000.0000 mL | Freq: Once | INTRAVENOUS | Status: AC
  Administered 2019-06-17: 1000 mL via INTRAVENOUS

## 2019-06-17 MED ORDER — SCOPOLAMINE 1 MG/3DAYS TD PT72
1.0000 | MEDICATED_PATCH | TRANSDERMAL | Status: DC
  Administered 2019-06-17: 23:00:00 1.5 mg via TRANSDERMAL
  Filled 2019-06-17: qty 1

## 2019-06-17 MED ORDER — PROMETHAZINE HCL 25 MG/ML IJ SOLN
25.0000 mg | Freq: Four times a day (QID) | INTRAMUSCULAR | Status: DC | PRN
  Administered 2019-06-18: 25 mg via INTRAVENOUS
  Filled 2019-06-17: qty 1

## 2019-06-17 NOTE — MAU Provider Note (Signed)
History     CSN: [id number]  Arrival date and time: 06/17/19 2119   First Provider Initiated Contact with Patient 06/17/19 2229      Chief Complaint  Patient presents with  . Emesis   28 y.o. [id number] @[redacted]w[redacted]d  by LMP presenting with N/V. Reports onset earlier today. She has been unable to tolerate food or fluids. Denies fever or diarrhea. Reports poor appetite over the last week. Denies VB or abd pain.   OB History    Gravida  8   Para  4   Term  4   Preterm      AB  3   Living  3     SAB  1   TAB  2   Ectopic      Multiple  0   Live Births  4        Obstetric Comments  TAB, 09/17/17        Past Medical History:  Diagnosis Date  . Anemia   . Eczema   . Prior pregnancy with fetal demise    weeks 38 gestation fetal demise  . Sickle cell trait (HCC)   . SVD (spontaneous vaginal delivery)    x 4     Past Surgical History:  Procedure Laterality Date  . DILATION AND CURETTAGE OF UTERUS    . DILATION AND EVACUATION N/A 07/16/2016   Procedure: DILATATION AND EVACUATION (D&E) 2ND TRIMESTER;  Surgeon: 09/15/2016, MD;  Location: [facility];  Service: Gynecology;  Laterality: N/A;  . THERAPEUTIC ABORTION      Family History  Problem Relation Age of Onset  . Hypertension Father     Social History   Tobacco Use  . Smoking status: Former Smoker    Packs/day: 0.25    Years: 7.00    Pack years: 1.75    Types: Cigars  . Smokeless tobacco: Never Used  Substance Use Topics  . Alcohol use: No  . Drug use: No    Allergies: No Known Allergies  Medications Prior to Admission  Medication Sig Dispense Refill Last Dose  . Prenatal Vit-Fe Fumarate-FA (PRENATAL MULTIVITAMIN) TABS tablet Take 1 tablet by mouth daily at 12 noon.   06/17/2019 at Unknown time  . Doxylamine-Pyridoxine 10-10 MG TBEC Take 1 tablet by mouth in the morning. 30 tablet 0   . hydrOXYzine (ATARAX/VISTARIL) 25 MG tablet Take 0.5-1 tablets (12.5-25 mg total) by mouth every 8 (eight) hours as  needed for itching. 30 tablet 0   . predniSONE (DELTASONE) 20 MG tablet Day 1-5: Take 2 tablets daily. Day 6-10: Take 1 tablet daily. Take tablets with breakfast. 10 tablet 0   . tiZANidine (ZANAFLEX) 4 MG tablet Take 1 tablet (4 mg total) by mouth every 8 (eight) hours as needed for muscle spasms. 30 tablet 0     Review of Systems  Constitutional: Negative for chills and fever.  Gastrointestinal: Positive for nausea and vomiting. Negative for abdominal pain.  Genitourinary: Negative for vaginal bleeding.   Physical Exam   Blood pressure (!) 110/53, pulse 63, temperature 98.4 F (36.9 C), temperature source Oral, resp. rate 16, height 5\' 7"  (1.702 m), weight 68 kg, last menstrual period 04/27/2019, SpO2 99 %, unknown if currently breastfeeding.  Physical Exam  Nursing note and vitals reviewed. Constitutional: She is oriented to person, place, and time. She appears well-developed and well-nourished. No distress.  HENT:  Head: Normocephalic and atraumatic.  Cardiovascular: Normal rate.  Respiratory: Effort normal. No respiratory distress.  Musculoskeletal:  General: Normal range of motion.     Cervical back: Normal range of motion.  Neurological: She is alert and oriented to person, place, and time.  Psychiatric: She has a normal mood and affect.   Results for orders placed or performed during the hospital encounter of 06/17/19 (from the past 24 hour(s))  Urinalysis, Routine w reflex microscopic     Status: Abnormal   Collection Time: 06/17/19 10:20 PM  Result Value Ref Range   Color, Urine YELLOW YELLOW   APPearance CLEAR CLEAR   Specific Gravity, Urine 1.013 1.005 - 1.030   pH 6.0 5.0 - 8.0   Glucose, UA NEGATIVE NEGATIVE mg/dL   Hgb urine dipstick NEGATIVE NEGATIVE   Bilirubin Urine NEGATIVE NEGATIVE   Ketones, ur 80 (A) NEGATIVE mg/dL   Protein, ur NEGATIVE NEGATIVE mg/dL   Nitrite NEGATIVE NEGATIVE   Leukocytes,Ua NEGATIVE NEGATIVE  CBC     Status: Abnormal    Collection Time: 06/17/19 11:56 PM  Result Value Ref Range   WBC 7.1 4.0 - 10.5 K/uL   RBC 5.76 (H) 3.87 - 5.11 MIL/uL   Hemoglobin 15.1 (H) 12.0 - 15.0 g/dL   HCT 45.1 36.0 - 46.0 %   MCV 78.3 (L) 80.0 - 100.0 fL   MCH 26.2 26.0 - 34.0 pg   MCHC 33.5 30.0 - 36.0 g/dL   RDW 13.6 11.5 - 15.5 %   Platelets 315 150 - 400 K/uL   nRBC 0.0 0.0 - 0.2 %  Basic metabolic panel     Status: Abnormal   Collection Time: 06/17/19 11:56 PM  Result Value Ref Range   Sodium 135 135 - 145 mmol/L   Potassium 3.3 (L) 3.5 - 5.1 mmol/L   Chloride 103 98 - 111 mmol/L   CO2 22 22 - 32 mmol/L   Glucose, Bld 89 70 - 99 mg/dL   BUN 7 6 - 20 mg/dL   Creatinine, Ser 0.60 0.44 - 1.00 mg/dL   Calcium 9.5 8.9 - 10.3 mg/dL   GFR calc non Af Amer >60 >60 mL/min   GFR calc Af Amer >60 >60 mL/min   Anion gap 10 5 - 15   MAU Course  Procedures Meds ordered this encounter  Medications  . scopolamine (TRANSDERM-SCOP) 1 MG/3DAYS 1.5 mg  . lactated ringers bolus 1,000 mL  . promethazine (PHENERGAN) injection 25 mg  . metoCLOPramide (REGLAN) injection 10 mg   MDM Labs ordered and reviewed. 0100: Tolerating small sips of ginger ale but doesn't feel better. Had emesis after patch was applied. Will order Reglan. 0315: tolerating crackers after Reglan, states feeling better, no further emesis. Stable for discharge home.   Assessment and Plan   1. [redacted] weeks gestation of pregnancy   2. Morning sickness   3. Dehydration    Discharge home Follow up with OB provider of choice to start care Rx Reglan Rx Scopolamine  Allergies as of 06/18/2019   No Known Allergies     Medication List    STOP taking these medications   hydrOXYzine 25 MG tablet Commonly known as: ATARAX/VISTARIL   predniSONE 20 MG tablet Commonly known as: DELTASONE   tiZANidine 4 MG tablet Commonly known as: Zanaflex     TAKE these medications   Doxylamine-Pyridoxine 10-10 MG Tbec Take 1 tablet by mouth in the morning.    metoCLOPramide 10 MG tablet Commonly known as: Reglan Take 1 tablet (10 mg total) by mouth every 6 (six) hours as needed for nausea or vomiting.  prenatal multivitamin Tabs tablet Take 1 tablet by mouth daily at 12 noon.   scopolamine 1 MG/3DAYS Commonly known as: TRANSDERM-SCOP Place 1 patch (1.5 mg total) onto the skin every 3 (three) days. Start taking on: June 20, 2019      Donette Larry, [place] 06/18/2019, 1:00 AM

## 2019-06-17 NOTE — MAU Note (Signed)
[redacted] weeks pregnant - did a test at [facility] last week.  Was seen and was told would receive medication for nausea and pain- then was, was pregnant- went to pharmacy and there was no medication for nausea.  Reports she thinks it was sent to wrong pharmacy. Has not called them because thought would feel better but didn't.  Tried fruit and water and warm bath- nothing helped. Vomited 3 times today.  No bleeding. No pain.

## 2019-06-18 ENCOUNTER — Encounter (HOSPITAL_COMMUNITY): Payer: Self-pay | Admitting: Obstetrics & Gynecology

## 2019-06-18 LAB — CBC
HCT: 45.1 % (ref 36.0–46.0)
Hemoglobin: 15.1 g/dL — ABNORMAL HIGH (ref 12.0–15.0)
MCH: 26.2 pg (ref 26.0–34.0)
MCHC: 33.5 g/dL (ref 30.0–36.0)
MCV: 78.3 fL — ABNORMAL LOW (ref 80.0–100.0)
Platelets: 315 10*3/uL (ref 150–400)
RBC: 5.76 MIL/uL — ABNORMAL HIGH (ref 3.87–5.11)
RDW: 13.6 % (ref 11.5–15.5)
WBC: 7.1 10*3/uL (ref 4.0–10.5)
nRBC: 0 % (ref 0.0–0.2)

## 2019-06-18 LAB — BASIC METABOLIC PANEL
Anion gap: 10 (ref 5–15)
BUN: 7 mg/dL (ref 6–20)
CO2: 22 mmol/L (ref 22–32)
Calcium: 9.5 mg/dL (ref 8.9–10.3)
Chloride: 103 mmol/L (ref 98–111)
Creatinine, Ser: 0.6 mg/dL (ref 0.44–1.00)
GFR calc Af Amer: >60 mL/min (ref >60)
GFR calc non Af Amer: >60 mL/min (ref >60)
Glucose, Bld: 89 mg/dL (ref 70–99)
Potassium: 3.3 mmol/L — ABNORMAL LOW (ref 3.5–5.1)
Sodium: 135 mmol/L (ref 135–145)

## 2019-06-18 MED ORDER — METOCLOPRAMIDE HCL 5 MG/ML IJ SOLN
10.0000 mg | Freq: Once | INTRAMUSCULAR | Status: AC
  Administered 2019-06-18: 10 mg via INTRAVENOUS
  Filled 2019-06-18: qty 2

## 2019-06-18 MED ORDER — METOCLOPRAMIDE HCL 10 MG PO TABS: 10.0000 mg | ORAL_TABLET | Freq: Four times a day (QID) | ORAL | 0 refills | Status: DC | PRN

## 2019-06-18 MED ORDER — SCOPOLAMINE 1 MG/3DAYS TD PT72: 1.0000 | MEDICATED_PATCH | TRANSDERMAL | 12 refills | Status: DC

## 2019-06-18 NOTE — Discharge Instructions (Signed)
Morning Sickness ° °Morning sickness is when you feel sick to your stomach (nauseous) during pregnancy. You may feel sick to your stomach and throw up (vomit). You may feel sick in the morning, but you can feel this way at any time of day. Some women feel very sick to their stomach and cannot stop throwing up (hyperemesis gravidarum). °Follow these instructions at home: °Medicines °· Take over-the-counter and prescription medicines only as told by your doctor. Do not take any medicines until you talk with your doctor about them first. °· Taking multivitamins before getting pregnant can stop or lessen the harshness of morning sickness. °Eating and drinking °· Eat dry toast or crackers before getting out of bed. °· Eat 5 or 6 small meals a day. °· Eat dry and bland foods like rice and baked potatoes. °· Do not eat greasy, fatty, or spicy foods. °· Have someone cook for you if the smell of food causes you to feel sick or throw up. °· If you feel sick to your stomach after taking prenatal vitamins, take them at night or with a snack. °· Eat protein when you need a snack. Nuts, yogurt, and cheese are good choices. °· Drink fluids throughout the day. °· Try ginger ale made with real ginger, ginger tea made from fresh grated ginger, or ginger candies. °General instructions °· Do not use any products that have nicotine or tobacco in them, such as cigarettes and e-cigarettes. If you need help quitting, ask your doctor. °· Use an air purifier to keep the air in your house free of smells. °· Get lots of fresh air. °· Try to avoid smells that make you feel sick. °· Try: °? Wearing a bracelet that is used for seasickness (acupressure wristband). °? Going to a doctor who puts thin needles into certain body points (acupuncture) to improve how you feel. °Contact a doctor if: °· You need medicine to feel better. °· You feel dizzy or light-headed. °· You are losing weight. °Get help right away if: °· You feel very sick to your  stomach and cannot stop throwing up. °· You pass out (faint). °· You have very bad pain in your belly. °Summary °· Morning sickness is when you feel sick to your stomach (nauseous) during pregnancy. °· You may feel sick in the morning, but you can feel this way at any time of day. °· Making some changes to what you eat may help your symptoms go away. °This information is not intended to replace advice given to you by your health care provider. Make sure you discuss any questions you have with your health care provider. °Document Revised: 02/04/2017 Document Reviewed: 03/25/2016 °Elsevier Patient Education © 2020 [employer] ° °

## 2019-06-23 ENCOUNTER — Inpatient Hospital Stay (HOSPITAL_COMMUNITY)
Admission: AD | Admit: 2019-06-23 | Discharge: 2019-06-23 | Disposition: A | Discharge status: Home / Self Care | Payer: Medicaid Other | Attending: Obstetrics & Gynecology | Admitting: Obstetrics & Gynecology

## 2019-06-23 ENCOUNTER — Encounter (HOSPITAL_COMMUNITY): Payer: Self-pay | Admitting: Obstetrics & Gynecology

## 2019-06-23 ENCOUNTER — Other Ambulatory Visit: Payer: Self-pay

## 2019-06-23 ENCOUNTER — Inpatient Hospital Stay (HOSPITAL_COMMUNITY): Payer: Medicaid Other

## 2019-06-23 DIAGNOSIS — Z8759 Personal history of other complications of pregnancy, childbirth and the puerperium: Secondary | ICD-10-CM | POA: Diagnosis not present

## 2019-06-23 DIAGNOSIS — O209 Hemorrhage in early pregnancy, unspecified: Secondary | ICD-10-CM | POA: Insufficient documentation

## 2019-06-23 DIAGNOSIS — O99011 Anemia complicating pregnancy, first trimester: Secondary | ICD-10-CM | POA: Insufficient documentation

## 2019-06-23 DIAGNOSIS — Z3490 Encounter for supervision of normal pregnancy, unspecified, unspecified trimester: Secondary | ICD-10-CM

## 2019-06-23 DIAGNOSIS — Z8249 Family history of ischemic heart disease and other diseases of the circulatory system: Secondary | ICD-10-CM | POA: Insufficient documentation

## 2019-06-23 DIAGNOSIS — Z6790 Unspecified blood type, Rh positive: Secondary | ICD-10-CM | POA: Insufficient documentation

## 2019-06-23 DIAGNOSIS — Z87891 Personal history of nicotine dependence: Secondary | ICD-10-CM | POA: Insufficient documentation

## 2019-06-23 DIAGNOSIS — D573 Sickle-cell trait: Secondary | ICD-10-CM | POA: Insufficient documentation

## 2019-06-23 DIAGNOSIS — Z3A01 Less than 8 weeks gestation of pregnancy: Secondary | ICD-10-CM | POA: Diagnosis not present

## 2019-06-23 DIAGNOSIS — N939 Abnormal uterine and vaginal bleeding, unspecified: Secondary | ICD-10-CM | POA: Diagnosis present

## 2019-06-23 DIAGNOSIS — Z3A08 8 weeks gestation of pregnancy: Secondary | ICD-10-CM

## 2019-06-23 DIAGNOSIS — O4690 Antepartum hemorrhage, unspecified, unspecified trimester: Secondary | ICD-10-CM

## 2019-06-23 LAB — COMPREHENSIVE METABOLIC PANEL
ALT: 10 U/L (ref 0–44)
AST: 13 U/L — ABNORMAL LOW (ref 15–41)
Albumin: 3.6 g/dL (ref 3.5–5.0)
Alkaline Phosphatase: 35 U/L — ABNORMAL LOW (ref 38–126)
Anion gap: 9 (ref 5–15)
BUN: 8 mg/dL (ref 6–20)
CO2: 23 mmol/L (ref 22–32)
Calcium: 9.1 mg/dL (ref 8.9–10.3)
Chloride: 104 mmol/L (ref 98–111)
Creatinine, Ser: 0.64 mg/dL (ref 0.44–1.00)
GFR calc Af Amer: >60 mL/min (ref >60)
GFR calc non Af Amer: >60 mL/min (ref >60)
Glucose, Bld: 89 mg/dL (ref 70–99)
Potassium: 3.8 mmol/L (ref 3.5–5.1)
Sodium: 136 mmol/L (ref 135–145)
Total Bilirubin: 0.5 mg/dL (ref 0.3–1.2)
Total Protein: 6.6 g/dL (ref 6.5–8.1)

## 2019-06-23 LAB — CBC
HCT: 44.6 % (ref 36.0–46.0)
Hemoglobin: 15 g/dL (ref 12.0–15.0)
MCH: 26.2 pg (ref 26.0–34.0)
MCHC: 33.6 g/dL (ref 30.0–36.0)
MCV: 77.8 fL — ABNORMAL LOW (ref 80.0–100.0)
Platelets: 294 10*3/uL (ref 150–400)
RBC: 5.73 MIL/uL — ABNORMAL HIGH (ref 3.87–5.11)
RDW: 13.8 % (ref 11.5–15.5)
WBC: 7.1 10*3/uL (ref 4.0–10.5)
nRBC: 0 % (ref 0.0–0.2)

## 2019-06-23 LAB — WET PREP, GENITAL
Clue Cells Wet Prep HPF POC: NONE SEEN
Sperm: NONE SEEN
Trich, Wet Prep: NONE SEEN
Yeast Wet Prep HPF POC: NONE SEEN

## 2019-06-23 LAB — URINALYSIS, ROUTINE W REFLEX MICROSCOPIC
Bilirubin Urine: NEGATIVE
Glucose, UA: NEGATIVE mg/dL
Ketones, ur: NEGATIVE mg/dL
Nitrite: NEGATIVE
Protein, ur: 30 mg/dL — AB
RBC / HPF: >50 RBC/hpf — ABNORMAL HIGH (ref 0–5)
Specific Gravity, Urine: 1.013 (ref 1.005–1.030)
WBC, UA: >50 WBC/hpf — ABNORMAL HIGH (ref 0–5)
pH: 6 (ref 5.0–8.0)

## 2019-06-23 LAB — HCG, QUANTITATIVE, PREGNANCY: hCG, Beta Chain, Quant, S: 102455 m[IU]/mL — ABNORMAL HIGH (ref <5)

## 2019-06-23 NOTE — MAU Provider Note (Signed)
History     CSN: [id number]  Arrival date and time: 06/23/19 1761   First Provider Initiated Contact with Patient 06/23/19 0911      Chief Complaint  Patient presents with  . Vaginal Bleeding   [name] is a 29 y.o. [id number] at [redacted]w[redacted]d who presents to MAU for vaginal bleeding which began 6 days ago. Pt describes the bleeding as a small amount on toilet paper after using the restroom. Patient reports no bleeding here in MAU. Patient also reports she has not picked up her Diclegis prescription due to cost. Patient also reports shortly before she found out she was pregnant she went to urgent care for blood in urine and reports she was told to drink more water as treatment.  Passing blood clots? no Blood soaking clothes? no Lightheaded/dizzy? no Significant pelvic pain or cramping? Minimal cramping Passed any tissue? no Hx ectopic pregnancy? no  Current pregnancy problems? Pt has not yet been seen Blood Type? B Positive Allergies? NKDA Current medications? none Current [facility] & next appt? [facility], 07/11/2019  Pt denies vaginal discharge/odor/itching. Pt denies N/V, abdominal pain, constipation, diarrhea, or urinary problems. Pt denies fever, chills, fatigue, sweating or changes in appetite. Pt denies SOB or chest pain. Pt denies dizziness, HA, light-headedness, weakness.   OB History    Gravida  8   Para  4   Term  4   Preterm      AB  3   Living  3     SAB  1   TAB  2   Ectopic      Multiple  0   Live Births  4        Obstetric Comments  TAB, 09/17/17        Past Medical History:  Diagnosis Date  . Anemia   . Eczema   . Prior pregnancy with fetal demise    weeks 38 gestation fetal demise  . Sickle cell trait (HCC)   . SVD (spontaneous vaginal delivery)    x 4     Past Surgical History:  Procedure Laterality Date  . DILATION AND CURETTAGE OF UTERUS    . DILATION AND EVACUATION N/A 07/16/2016   Procedure: DILATATION AND EVACUATION (D&E) 2ND  TRIMESTER;  Surgeon: Reva Bores, MD;  Location: [facility];  Service: Gynecology;  Laterality: N/A;  . THERAPEUTIC ABORTION      Family History  Problem Relation Age of Onset  . Hypertension Father     Social History   Tobacco Use  . Smoking status: Former Smoker    Packs/day: 0.25    Years: 7.00    Pack years: 1.75    Types: Cigars  . Smokeless tobacco: Never Used  Substance Use Topics  . Alcohol use: No  . Drug use: No    Allergies: No Known Allergies  Medications Prior to Admission  Medication Sig Dispense Refill Last Dose  . Doxylamine-Pyridoxine 10-10 MG TBEC Take 1 tablet by mouth in the morning. 30 tablet 0   . metoCLOPramide (REGLAN) 10 MG tablet Take 1 tablet (10 mg total) by mouth every 6 (six) hours as needed for nausea or vomiting. 30 tablet 0   . Prenatal Vit-Fe Fumarate-FA (PRENATAL MULTIVITAMIN) TABS tablet Take 1 tablet by mouth daily at 12 noon.     Marland Kitchen scopolamine (TRANSDERM-SCOP) 1 MG/3DAYS Place 1 patch (1.5 mg total) onto the skin every 3 (three) days. 10 patch 12     Review of Systems  Constitutional: Negative  for chills, diaphoresis, fatigue and fever.  Eyes: Negative for visual disturbance.  Respiratory: Negative for shortness of breath.   Cardiovascular: Negative for chest pain.  Gastrointestinal: Negative for abdominal pain, constipation, diarrhea, nausea and vomiting.  Genitourinary: Positive for pelvic pain and vaginal bleeding. Negative for dysuria, flank pain, frequency, urgency and vaginal discharge.  Neurological: Negative for dizziness, weakness, light-headedness and headaches.   Physical Exam   Blood pressure 124/70, pulse 71, temperature 98 F (36.7 C), temperature source Oral, resp. rate 16, last menstrual period 04/27/2019, SpO2 99 %, unknown if currently breastfeeding.  Patient Vitals for the past 24 hrs:  BP Temp Temp src Pulse Resp SpO2  06/23/19 0816 124/70 98 F (36.7 C) Oral 71 16 99 %   Physical Exam  Constitutional: She  is oriented to person, place, and time. She appears well-developed and well-nourished. No distress.  HENT:  Head: Normocephalic and atraumatic.  Respiratory: Effort normal.  GI: Soft. She exhibits no distension and no mass. There is no abdominal tenderness. There is no rebound and no guarding.  Neurological: She is alert and oriented to person, place, and time.  Skin: Skin is warm and dry. She is not diaphoretic.  Psychiatric: She has a normal mood and affect. Her behavior is normal. Judgment and thought content normal.  Pelvic exam deferred d/t lack fo symptoms in MAU  Results for orders placed or performed during the hospital encounter of 06/23/19 (from the past 24 hour(s))  Urinalysis, Routine w reflex microscopic     Status: Abnormal   Collection Time: 06/23/19  8:18 AM  Result Value Ref Range   Color, Urine YELLOW YELLOW   APPearance CLOUDY (A) CLEAR   Specific Gravity, Urine 1.013 1.005 - 1.030   pH 6.0 5.0 - 8.0   Glucose, UA NEGATIVE NEGATIVE mg/dL   Hgb urine dipstick LARGE (A) NEGATIVE   Bilirubin Urine NEGATIVE NEGATIVE   Ketones, ur NEGATIVE NEGATIVE mg/dL   Protein, ur 30 (A) NEGATIVE mg/dL   Nitrite NEGATIVE NEGATIVE   Leukocytes,Ua LARGE (A) NEGATIVE   RBC / HPF >50 (H) 0 - 5 RBC/hpf   WBC, UA >50 (H) 0 - 5 WBC/hpf   Bacteria, UA RARE (A) NONE SEEN   Squamous Epithelial / LPF 11-20 0 - 5   WBC Clumps PRESENT    Mucus PRESENT    Non Squamous Epithelial 0-5 (A) NONE SEEN  CBC     Status: Abnormal   Collection Time: 06/23/19  9:31 AM  Result Value Ref Range   WBC 7.1 4.0 - 10.5 K/uL   RBC 5.73 (H) 3.87 - 5.11 MIL/uL   Hemoglobin 15.0 12.0 - 15.0 g/dL   HCT 36.4 68.0 - 32.1 %   MCV 77.8 (L) 80.0 - 100.0 fL   MCH 26.2 26.0 - 34.0 pg   MCHC 33.6 30.0 - 36.0 g/dL   RDW 22.4 82.5 - 00.3 %   Platelets 294 150 - 400 K/uL   nRBC 0.0 0.0 - 0.2 %  Comprehensive metabolic panel     Status: Abnormal   Collection Time: 06/23/19  9:31 AM  Result Value Ref Range   Sodium  136 135 - 145 mmol/L   Potassium 3.8 3.5 - 5.1 mmol/L   Chloride 104 98 - 111 mmol/L   CO2 23 22 - 32 mmol/L   Glucose, Bld 89 70 - 99 mg/dL   BUN 8 6 - 20 mg/dL   Creatinine, Ser 7.04 0.44 - 1.00 mg/dL   Calcium 9.1 8.9 -  10.3 mg/dL   Total Protein 6.6 6.5 - 8.1 g/dL   Albumin 3.6 3.5 - 5.0 g/dL   AST 13 (L) 15 - 41 U/L   ALT 10 0 - 44 U/L   Alkaline Phosphatase 35 (L) 38 - 126 U/L   Total Bilirubin 0.5 0.3 - 1.2 mg/dL   GFR calc non Af Amer >60 >60 mL/min   GFR calc Af Amer >60 >60 mL/min   Anion gap 9 5 - 15  hCG, quantitative, pregnancy     Status: Abnormal   Collection Time: 06/23/19  9:31 AM  Result Value Ref Range   hCG, Beta Chain, Quant, S 102,455 (H) <5 mIU/mL  Wet prep, genital     Status: Abnormal   Collection Time: 06/23/19  9:42 AM  Result Value Ref Range   Yeast Wet Prep HPF POC NONE SEEN NONE SEEN   Trich, Wet Prep NONE SEEN NONE SEEN   Clue Cells Wet Prep HPF POC NONE SEEN NONE SEEN   WBC, Wet Prep HPF POC MODERATE (A) NONE SEEN   Sperm NONE SEEN    US OB Comp Less 14 Wks  Result Date: 06/23/2019 CLINICAL DATA:  Vaginal bleeding. EXAM: OBSTETRIC <14 WK ULTRASOUND TECHNIQUE: Transabdominal ultrasound was performed for evaluation of the gestation as well as the maternal uterus and adnexal regions. COMPARISON:  None. FINDINGS: Intrauterine gestational sac: Single Yolk sac:  Visualized Embryo:  Visualized Cardiac Activity: Visualized Heart Rate: 110 bpm MSD:    mm    w     d CRL:   6.4 mm   6 w 3 d                  [place] EDC: 02/13/2020 Subchorionic hemorrhage:  None visualized. Maternal uterus/adnexae: Maternal ovaries appear normal. No mass or free fluid is seen within either adnexal region IMPRESSION: 1. Single live intrauterine pregnancy with estimated gestational age of [redacted] weeks and 3 days. 2. No subchorionic hemorrhage. 3. No mass or free fluid within either adnexal region. Electronically Signed   By: Franki Cabot M.D.   On: 06/23/2019 10:52    MAU Course   Procedures  MDM -r/o ectopic -UA: cloudy/lg hgb/30PRO/lg leuks/rare bacteria, sending urine for culture -CBC: WNL -CMP: WNL -[place]: single IUP, [redacted]w[redacted]d -hCG: 102,455 -ABO: B Positive -WetPrep: WNL -GC/CT collected -pt discharged to home in stable condition  Orders Placed This Encounter  Procedures  . Wet prep, genital    Standing Status:   Standing    Number of Occurrences:   1  . Culture, OB Urine    Standing Status:   Standing    Number of Occurrences:   1  . US OB Comp Less 14 Wks    Standing Status:   Standing    Number of Occurrences:   1    Order Specific Question:   Symptom/Reason for Exam    Answer:   Vaginal bleeding in pregnancy [161096]  . Urinalysis, Routine w reflex microscopic    Standing Status:   Standing    Number of Occurrences:   1  . CBC    Standing Status:   Standing    Number of Occurrences:   1  . Comprehensive metabolic panel    Standing Status:   Standing    Number of Occurrences:   1  . hCG, quantitative, pregnancy    Standing Status:   Standing    Number of Occurrences:   1  . Discharge patient  Order Specific Question:   Discharge disposition    Answer:   01-Home or Self Care [1]    Order Specific Question:   Discharge patient date    Answer:   06/23/2019    Assessment and Plan   1. Vaginal bleeding in pregnancy   2. Blood type, Rh positive   3. Intrauterine pregnancy   4. [redacted] weeks gestation of pregnancy    Allergies as of 06/23/2019   No Known Allergies     Medication List    TAKE these medications   Doxylamine-Pyridoxine 10-10 MG Tbec Take 1 tablet by mouth in the morning.   metoCLOPramide 10 MG tablet Commonly known as: Reglan Take 1 tablet (10 mg total) by mouth every 6 (six) hours as needed for nausea or vomiting.   prenatal multivitamin Tabs tablet Take 1 tablet by mouth daily at 12 noon.   scopolamine 1 MG/3DAYS Commonly known as: TRANSDERM-SCOP Place 1 patch (1.5 mg total) onto the skin every 3 (three) days.       -will call with culture results, if postiive -Diclegis OTC instructions given -discussed GoodRx to assist with the cost of medications, patient advised to take Diclegis or Reglan and Scopolamine -discussed possible causes of bleeding in early pregnancy, including SAB, cervical friability, [facility] -return MAU precautions given -pt discharged to home in stable condition  Joni Reining E Barnett Elzey 06/23/2019, 11:35 AM

## 2019-06-23 NOTE — MAU Note (Signed)
Evelyn [name] is a 29 y.o. at [redacted]w[redacted]d here in MAU reporting:  +vaginal bleeding Endorses that it is similar to a light period; noticed as "streaks" when going to the bathroom. Reports intercourse in the last 24 hours.  +abdominal pain States that it is similar to the pressure of needing to go to the bathroom but you can't.  Last BM was yesterday. Pain score: 6/10 Endorses odorous urine.   +Emesis States that she has had continuous problems with her N/V and generally not feeling well since her last MAU visit on 06/17/19. Reports that she has not picked up her nausea medications due to expense as her medicade has not picked up. States it was $50 just for one of them. Has been trying to do home remedies.  1 episode of vomiting in the last 24 hours. Feels like it is every morning.  Onset of complaint: since 06/17/19  Vitals:   06/23/19 0816  BP: 124/70  Pulse: 71  Resp: 16  Temp: 98 F (36.7 C)  SpO2: 99%     Lab orders placed from triage: ua

## 2019-06-23 NOTE — Discharge Instructions (Signed)
For nausea and vomiting: Unisom 12.5 mg at bedtime Vitamin B6  at bedtime and in the morning. Can take up to  in the morning and  at night of Vitamin B6.    Abdominal Pain During Pregnancy  Abdominal pain is common during pregnancy, and has many possible causes. Some causes are more serious than others, and sometimes the cause is not known. Abdominal pain can be a sign that labor is starting. It can also be caused by normal growth and stretching of muscles and ligaments during pregnancy. Always tell your health care provider if you have any abdominal pain. Follow these instructions at home:  Do not have sex or put anything in your vagina until your pain goes away completely.  Get plenty of rest until your pain improves.  Drink enough fluid to keep your urine pale yellow.  Take over-the-counter and prescription medicines only as told by your health care provider.  Keep all follow-up visits as told by your health care provider. This is important. Contact a health care provider if:  Your pain continues or gets worse after resting.  You have lower abdominal pain that: ? Comes and goes at regular intervals. ? Spreads to your back. ? Is similar to menstrual cramps.  You have pain or burning when you urinate. Get help right away if:  You have a fever or chills.  You have vaginal bleeding.  You are leaking fluid from your vagina.  You are passing tissue from your vagina.  You have vomiting or diarrhea that lasts for more than 24 hours.  Your baby is moving less than usual.  You feel very weak or faint.  You have shortness of breath.  You develop severe pain in your upper abdomen. Summary  Abdominal pain is common during pregnancy, and has many possible causes.  If you experience abdominal pain during pregnancy, tell your health care provider right away.  Follow your health care provider's home care instructions and keep all follow-up visits as  directed. This information is not intended to replace advice given to you by your health care provider. Make sure you discuss any questions you have with your health care provider. Document Revised: 06/12/2018 Document Reviewed: 05/27/2016 Elsevier Patient Education  2020 [employer]                     Safe Medications in Pregnancy    Acne: Benzoyl Peroxide Salicylic Acid  Backache/Headache: Tylenol: 2 regular strength every 4 hours OR              2 Extra strength every 6 hours  Colds/Coughs/Allergies: Benadryl (alcohol free) 25 mg every 6 hours as needed Breath right strips Claritin Cepacol throat lozenges Chloraseptic throat spray Cold-Eeze- up to three times per day Cough drops, alcohol free Flonase (by prescription only) Guaifenesin Mucinex Robitussin DM (plain only, alcohol free) Saline nasal spray/drops Sudafed (pseudoephedrine) & Actifed ** use only after [redacted] weeks gestation and if you do not have high blood pressure Tylenol Vicks Vaporub Zinc lozenges Zyrtec   Constipation: Colace Ducolax suppositories Fleet enema Glycerin suppositories Metamucil Milk of magnesia Miralax Senokot Smooth move tea  Diarrhea: Kaopectate Imodium A-D  *NO pepto Bismol  Hemorrhoids: Anusol Anusol HC Preparation H Tucks  Indigestion: Tums Maalox Mylanta Zantac  Pepcid  Insomnia: Benadryl (alcohol free)  every 6 hours as needed Tylenol PM Unisom, no Gelcaps  Leg Cramps: Tums MagGel  Nausea/Vomiting:  Bonine Dramamine Emetrol Ginger extract Sea bands Meclizine  Nausea medication  to take during pregnancy:  Unisom (doxylamine succinate 25 mg tablets) Take one tablet daily at bedtime. If symptoms are not adequately controlled, the dose can be increased to a maximum recommended dose of two tablets daily (1/2 tablet in the morning, 1/2 tablet mid-afternoon and one at bedtime). Vitamin B6 100mg  tablets. Take one tablet twice a day (up to 200 mg per  day).  Skin Rashes: Aveeno products Benadryl cream or 25mg  every 6 hours as needed Calamine Lotion 1% cortisone cream  Yeast infection: Gyne-lotrimin 7 Monistat 7   **If taking multiple medications, please check labels to avoid duplicating the same active ingredients **take medication as directed on the label ** Do not exceed 4000 mg of tylenol in 24 hours **Do not take medications that contain aspirin or ibuprofen     First Trimester of Pregnancy The first trimester of pregnancy is from week 1 until the end of week 13 (months 1 through 3). A week after a sperm fertilizes an egg, the egg will implant on the wall of the uterus. This embryo will begin to develop into a baby. Genes from you and your partner will form the baby. The female genes will determine whether the baby will be a boy or a girl. At 6-8 weeks, the eyes and face will be formed, and the heartbeat can be seen on ultrasound. At the end of 12 weeks, all the baby's organs will be formed. Now that you are pregnant, you will want to do everything you can to have a healthy baby. Two of the most important things are to get good prenatal care and to follow your health care provider's instructions. Prenatal care is all the medical care you receive before the baby's birth. This care will help prevent, find, and treat any problems during the pregnancy and childbirth. Body changes during your first trimester Your body goes through many changes during pregnancy. The changes vary from woman to woman.  You may gain or lose a couple of pounds at first.  You may feel sick to your stomach (nauseous) and you may throw up (vomit). If the vomiting is uncontrollable, call your health care provider.  You may tire easily.  You may develop headaches that can be relieved by medicines. All medicines should be approved by your health care provider.  You may urinate more often. Painful urination may mean you have a bladder infection.  You may  develop heartburn as a result of your pregnancy.  You may develop constipation because certain hormones are causing the muscles that push stool through your intestines to slow down.  You may develop hemorrhoids or swollen veins (varicose veins).  Your breasts may begin to grow larger and become tender. Your nipples may stick out more, and the tissue that surrounds them (areola) may become darker.  Your gums may bleed and may be sensitive to brushing and flossing.  Dark spots or blotches (chloasma, mask of pregnancy) may develop on your face. This will likely fade after the baby is born.  Your menstrual periods will stop.  You may have a loss of appetite.  You may develop cravings for certain kinds of food.  You may have changes in your emotions from day to day, such as being excited to be pregnant or being concerned that something may go wrong with the pregnancy and baby.  You may have more vivid and strange dreams.  You may have changes in your hair. These can include thickening of your hair, rapid growth, and changes  in texture. Some women also have hair loss during or after pregnancy, or hair that feels dry or thin. Your hair will most likely return to normal after your baby is born. What to expect at prenatal visits During a routine prenatal visit:  You will be weighed to make sure you and the baby are growing normally.  Your blood pressure will be taken.  Your abdomen will be measured to track your baby's growth.  The fetal heartbeat will be listened to between weeks 10 and 14 of your pregnancy.  Test results from any previous visits will be discussed. Your health care provider may ask you:  How you are feeling.  If you are feeling the baby move.  If you have had any abnormal symptoms, such as leaking fluid, bleeding, severe headaches, or abdominal cramping.  If you are using any tobacco products, including cigarettes, chewing tobacco, and electronic cigarettes.  If  you have any questions. Other tests that may be performed during your first trimester include:  Blood tests to find your blood type and to check for the presence of any previous infections. The tests will also be used to check for low iron levels (anemia) and protein on red blood cells (Rh antibodies). Depending on your risk factors, or if you previously had diabetes during pregnancy, you may have tests to check for high blood sugar that affects pregnant women (gestational diabetes).  Urine tests to check for infections, diabetes, or protein in the urine.  An ultrasound to confirm the proper growth and development of the baby.  Fetal screens for spinal cord problems (spina bifida) and Down syndrome.  HIV (human immunodeficiency virus) testing. Routine prenatal testing includes screening for HIV, unless you choose not to have this test.  You may need other tests to make sure you and the baby are doing well. Follow these instructions at home: Medicines  Follow your health care provider's instructions regarding medicine use. Specific medicines may be either safe or unsafe to take during pregnancy.  Take a prenatal vitamin that contains at least 600 micrograms (mcg) of folic acid.  If you develop constipation, try taking a stool softener if your health care provider approves. Eating and drinking   Eat a balanced diet that includes fresh fruits and vegetables, whole grains, good sources of protein such as meat, eggs, or tofu, and low-fat dairy. Your health care provider will help you determine the amount of weight gain that is right for you.  Avoid raw meat and uncooked cheese. These carry germs that can cause birth defects in the baby.  Eating four or five small meals rather than three large meals a day may help relieve nausea and vomiting. If you start to feel nauseous, eating a few soda crackers can be helpful. Drinking liquids between meals, instead of during meals, also seems to help  ease nausea and vomiting.  Limit foods that are high in fat and processed sugars, such as fried and sweet foods.  To prevent constipation: ? Eat foods that are high in fiber, such as fresh fruits and vegetables, whole grains, and beans. ? Drink enough fluid to keep your urine clear or pale yellow. Activity  Exercise only as directed by your health care provider. Most women can continue their usual exercise routine during pregnancy. Try to exercise for 30 minutes at least 5 days a week. Exercising will help you: ? Control your weight. ? Stay in shape. ? Be prepared for labor and delivery.  Experiencing pain or cramping  in the lower abdomen or lower back is a good sign that you should stop exercising. Check with your health care provider before continuing with normal exercises.  Try to avoid standing for long periods of time. Move your legs often if you must stand in one place for a long time.  Avoid heavy lifting.  Wear low-heeled shoes and practice good posture.  You may continue to have sex unless your health care provider tells you not to. Relieving pain and discomfort  Wear a good support bra to relieve breast tenderness.  Take warm sitz baths to soothe any pain or discomfort caused by hemorrhoids. Use hemorrhoid cream if your health care provider approves.  Rest with your legs elevated if you have leg cramps or low back pain.  If you develop varicose veins in your legs, wear support hose. Elevate your feet for 15 minutes, 3-4 times a day. Limit salt in your diet. Prenatal care  Schedule your prenatal visits by the twelfth week of pregnancy. They are usually scheduled monthly at first, then more often in the last 2 months before delivery.  Write down your questions. Take them to your prenatal visits.  Keep all your prenatal visits as told by your health care provider. This is important. Safety  Wear your seat belt at all times when driving.  Make a list of emergency  phone numbers, including numbers for family, friends, the hospital, and police and fire departments. General instructions  Ask your health care provider for a referral to a local prenatal education class. Begin classes no later than the beginning of month 6 of your pregnancy.  Ask for help if you have counseling or nutritional needs during pregnancy. Your health care provider can offer advice or refer you to specialists for help with various needs.  Do not use hot tubs, steam rooms, or saunas.  Do not douche or use tampons or scented sanitary pads.  Do not cross your legs for long periods of time.  Avoid cat litter boxes and soil used by cats. These carry germs that can cause birth defects in the baby and possibly loss of the fetus by miscarriage or stillbirth.  Avoid all smoking, herbs, alcohol, and medicines not prescribed by your health care provider. Chemicals in these products affect the formation and growth of the baby.  Do not use any products that contain nicotine or tobacco, such as cigarettes and e-cigarettes. If you need help quitting, ask your health care provider. You may receive counseling support and other resources to help you quit.  Schedule a dentist appointment. At home, brush your teeth with a soft toothbrush and be gentle when you floss. Contact a health care provider if:  You have dizziness.  You have mild pelvic cramps, pelvic pressure, or nagging pain in the abdominal area.  You have persistent nausea, vomiting, or diarrhea.  You have a bad smelling vaginal discharge.  You have pain when you urinate.  You notice increased swelling in your face, hands, legs, or ankles.  You are exposed to fifth disease or chickenpox.  You are exposed to [place] measles (rubella) and have never had it. Get help right away if:  You have a fever.  You are leaking fluid from your vagina.  You have spotting or bleeding from your vagina.  You have severe abdominal cramping  or pain.  You have rapid weight gain or loss.  You vomit blood or material that looks like coffee grounds.  You develop a severe headache.  You have shortness of breath.  You have any kind of trauma, such as from a fall or a car accident. Summary  The first trimester of pregnancy is from week 1 until the end of week 13 (months 1 through 3).  Your body goes through many changes during pregnancy. The changes vary from woman to woman.  You will have routine prenatal visits. During those visits, your health care provider will examine you, discuss any test results you may have, and talk with you about how you are feeling. This information is not intended to replace advice given to you by your health care provider. Make sure you discuss any questions you have with your health care provider. Document Revised: 02/04/2017 Document Reviewed: 02/04/2016 Elsevier Patient Education  2020 Elsevier Inc.       Subchorionic Hematoma  A subchorionic hematoma is a gathering of blood between the outer wall of the embryo (chorion) and the inner wall of the womb (uterus). This condition can cause vaginal bleeding. If they cause little or no vaginal bleeding, early small hematomas usually shrink on their own and do not affect your baby or pregnancy. When bleeding starts later in pregnancy, or if the hematoma is larger or occurs in older pregnant women, the condition may be more serious. Larger hematomas may get bigger, which increases the chances of miscarriage. This condition also increases the risk of:  Premature separation of the placenta from the uterus.  Premature (preterm) labor.  Stillbirth. What are the causes? The exact cause of this condition is not known. It occurs when blood is trapped between the placenta and the uterine wall because the placenta has separated from the original site of implantation. What increases the risk? You are more likely to develop this condition if:  You were  treated with fertility medicines.  You conceived through in vitro fertilization (IVF). What are the signs or symptoms? Symptoms of this condition include:  Vaginal spotting or bleeding.  Contractions of the uterus. These cause abdominal pain. Sometimes you may have no symptoms and the bleeding may only be seen when ultrasound images are taken (transvaginal ultrasound). How is this diagnosed? This condition is diagnosed based on a physical exam. This includes a pelvic exam. You may also have other tests, including:  Blood tests.  Urine tests.  Ultrasound of the abdomen. How is this treated? Treatment for this condition can vary. Treatment may include:  Watchful waiting. You will be monitored closely for any changes in bleeding. During this stage: ? The hematoma may be reabsorbed by the body. ? The hematoma may separate the fluid-filled space containing the embryo (gestational sac) from the wall of the womb (endometrium).  Medicines.  Activity restriction. This may be needed until the bleeding stops. Follow these instructions at home:  Stay on bed rest if told to do so by your health care provider.  Do not lift anything that is heavier than 10 lbs. (4.5 kg) or as told by your health care provider.  Do not use any products that contain nicotine or tobacco, such as cigarettes and e-cigarettes. If you need help quitting, ask your health care provider.  Track and write down the number of pads you use each day and how soaked (saturated) they are.  Do not use tampons.  Keep all follow-up visits as told by your health care provider. This is important. Your health care provider may ask you to have follow-up blood tests or ultrasound tests or both. Contact a health care provider if:  You have any vaginal bleeding.  You have a fever. Get help right away if:  You have severe cramps in your stomach, back, abdomen, or pelvis.  You pass large clots or tissue. Save any tissue for  your health care provider to look at.  You have more vaginal bleeding, and you faint or become lightheaded or weak. Summary  A subchorionic hematoma is a gathering of blood between the outer wall of the placenta and the uterus.  This condition can cause vaginal bleeding.  Sometimes you may have no symptoms and the bleeding may only be seen when ultrasound images are taken.  Treatment may include watchful waiting, medicines, or activity restriction. This information is not intended to replace advice given to you by your health care provider. Make sure you discuss any questions you have with your health care provider. Document Revised: 02/04/2017 Document Reviewed: 04/20/2016 Elsevier Patient Education  2020 Else[employer] Inc.  Vaginal Bleeding During Pregnancy, First Trimester  A small amount of bleeding from the vagina (spotting) is relatively common during early pregnancy. It usually stops on its own. Various things may cause bleeding or spotting during early pregnancy. Some bleeding may be related to the pregnancy, and some may not. In many cases, the bleeding is normal and is not a problem. However, bleeding can also be a sign of something serious. Be sure to tell your health care provider about any vaginal bleeding right away. Some possible causes of vaginal bleeding during the first trimester include:  Infection or inflammation of the cervix.  Growths (polyps) on the cervix.  Miscarriage or threatened miscarriage.  Pregnancy tissue developing outside of the uterus (ectopic pregnancy).  A mass of tissue developing in the uterus due to an egg being fertilized incorrectly (molar pregnancy). Follow these instructions at home: Activity  Follow instructions from your health care provider about limiting your activity. Ask what activities are safe for you.  If needed, make plans for someone to help with your regular activities.  Do not have sex or orgasms until your health care provider  says that this is safe. General instructions  Take over-the-counter and prescription medicines only as told by your health care provider.  Pay attention to any changes in your symptoms.  Do not use tampons or douche.  Write down how many pads you use each day, how often you change pads, and how soaked (saturated) they are.  If you pass any tissue from your vagina, save the tissue so you can show it to your health care provider.  Keep all follow-up visits as told by your health care provider. This is important. Contact a health care provider if:  You have vaginal bleeding during any part of your pregnancy.  You have cramps or labor pains.  You have a fever. Get help right away if:  You have severe cramps in your back or abdomen.  You pass large clots or a large amount of tissue from your vagina.  Your bleeding increases.  You feel light-headed or weak, or you faint.  You have chills.  You are leaking fluid or have a gush of fluid from your vagina. Summary  A small amount of bleeding (spotting) from the vagina is relatively common during early pregnancy.  Various things may cause bleeding or spotting in early pregnancy.  Be sure to tell your health care provider about any vaginal bleeding right away. This information is not intended to replace advice given to you by your health care provider. Make sure you discuss  any questions you have with your health care provider. Document Revised: 06/13/2018 Document Reviewed: 05/27/2016 Elsevier Patient Education  [place].

## 2019-06-25 ENCOUNTER — Telehealth: Payer: Self-pay | Admitting: Women's Health

## 2019-06-25 LAB — GC/CHLAMYDIA PROBE AMP (~~LOC~~) NOT AT ARMC
Chlamydia: NEGATIVE
Comment: NEGATIVE
Comment: NORMAL
Neisseria Gonorrhea: NEGATIVE

## 2019-06-25 LAB — CULTURE, OB URINE: Culture: >=100000 — AB

## 2019-06-25 NOTE — Telephone Encounter (Signed)
Attempt #1 to call patient re: +UTI. Phone did not ring, patient identified self by name on voicemail, voicemail left requesting return phone call to [facility] provider office.  Marylen Ponto, NP  10:43 AM 06/25/2019

## 2019-06-26 ENCOUNTER — Telehealth: Payer: Self-pay | Admitting: Certified Nurse Midwife

## 2019-06-26 DIAGNOSIS — O2341 Unspecified infection of urinary tract in pregnancy, first trimester: Secondary | ICD-10-CM

## 2019-06-26 MED ORDER — CEFADROXIL 500 MG PO CAPS: 500.0000 mg | ORAL_CAPSULE | Freq: Two times a day (BID) | ORAL | 0 refills | Status: DC

## 2019-06-26 NOTE — Telephone Encounter (Signed)
+  UTI, pt notified. Rx sent.

## 2019-07-18 ENCOUNTER — Other Ambulatory Visit: Payer: Self-pay

## 2019-07-18 ENCOUNTER — Encounter: Payer: Self-pay | Admitting: Advanced Practice Midwife

## 2019-07-18 ENCOUNTER — Ambulatory Visit (INDEPENDENT_AMBULATORY_CARE_PROVIDER_SITE_OTHER): Payer: Self-pay | Admitting: Advanced Practice Midwife

## 2019-07-18 ENCOUNTER — Other Ambulatory Visit (HOSPITAL_COMMUNITY)
Admission: RE | Admit: 2019-07-18 | Discharge: 2019-07-18 | Disposition: A | Discharge status: Home / Self Care | Payer: Medicaid Other | Source: Ambulatory Visit | Attending: Advanced Practice Midwife | Admitting: Advanced Practice Midwife

## 2019-07-18 DIAGNOSIS — Z3481 Encounter for supervision of other normal pregnancy, first trimester: Secondary | ICD-10-CM

## 2019-07-18 DIAGNOSIS — O99011 Anemia complicating pregnancy, first trimester: Secondary | ICD-10-CM

## 2019-07-18 DIAGNOSIS — Z8759 Personal history of other complications of pregnancy, childbirth and the puerperium: Secondary | ICD-10-CM | POA: Insufficient documentation

## 2019-07-18 DIAGNOSIS — Z3480 Encounter for supervision of other normal pregnancy, unspecified trimester: Secondary | ICD-10-CM | POA: Diagnosis not present

## 2019-07-18 DIAGNOSIS — Z3A10 10 weeks gestation of pregnancy: Secondary | ICD-10-CM

## 2019-07-18 DIAGNOSIS — O09291 Supervision of pregnancy with other poor reproductive or obstetric history, first trimester: Secondary | ICD-10-CM

## 2019-07-18 DIAGNOSIS — D573 Sickle-cell trait: Secondary | ICD-10-CM

## 2019-07-18 MED ORDER — BLOOD PRESSURE KIT DEVI
1.0000 | 0 refills | Status: DC | PRN
Start: 2019-07-18 — End: 2020-02-28

## 2019-07-18 NOTE — Addendum Note (Signed)
Addended by: Thressa Sheller D on: 07/18/2019 10:45 AM   Modules accepted: Orders

## 2019-07-18 NOTE — Progress Notes (Signed)
NOB in office, denies pain. Pt states that she has been having a lot of nausea and vomiting but was unable to get rx due to medicaid being inactive, pt has insurance today, advised rx is at the pharmacy.

## 2019-07-18 NOTE — Progress Notes (Signed)
Subjective:   Evelyn [name] is a 30 y.o. [id number] at [redacted]w[redacted]d by early ultrasound being seen today for her first obstetrical visit.  Her obstetrical history is significant for history of term IUFD in 2010. Patient does not intend to breast feed. Pregnancy history fully reviewed.  Patient reports nausea/vomiting, sensitvity to smells. Marland Kitchen  HISTORY: OB History  Gravida Para Term Preterm AB Living  8 4 4  0 3 3  SAB TAB Ectopic Multiple Live Births  1 2 0 0 4    # Outcome Date GA Lbr Len/2nd Weight Sex Delivery Anes PTL Lv  8 Current           7 TAB 09/17/17 [redacted]w[redacted]d         6 Term 07/11/14 [redacted]w[redacted]d  8 lb 4.3 oz (3.75 kg) M Vag-Spont EPI  LIV     Birth Comments: bruised face     Apgar1: 8  Apgar5: 9  5 Term 09/30/12 [redacted]w[redacted]d 14:05 / 00:32 7 lb 15 oz (3.6 kg) F Vag-Spont EPI  LIV     Birth Comments: New born screen repeated today--10/24/2012  Sickle cell trait     Name: MAYLEIGH, TETRAULT     Apgar1: 9  Apgar5: 9  4 Term 04/02/11 [redacted]w[redacted]d 14:09 / 00:49 7 lb 10.4 oz (3.47 kg) F Vag-Spont EPI  LIV     Birth Comments: Normal new born screen--HB FA     Name: Marrow,GIRL Evolet     Apgar1: 9  Apgar5: 9  3 Term 11/16/08 [redacted]w[redacted]d  5 lb 6 oz (2.438 kg)  Vag-Spont EPI N ND     Birth Comments: pt had prenatal @ [facility] examination could not find baby's heartbeat during routine prenatal care. States she did not have any problems or drug use. Pt was induced.    2 SAB           1 TAB             Obstetric Comments  TAB, 09/17/17    Last pap smear was done 2016 and was normal  Past Medical History:  Diagnosis Date  . Anemia   . Eczema   . Prior pregnancy with fetal demise    weeks 84 gestation fetal demise  . Sickle cell trait ([place])   . SVD (spontaneous vaginal delivery)    x 4    Past Surgical History:  Procedure Laterality Date  . DILATION AND CURETTAGE OF UTERUS    . DILATION AND EVACUATION N/A 07/16/2016   Procedure: DILATATION AND EVACUATION (D&E) 2ND TRIMESTER;  Surgeon: Donnamae Jude,  MD;  Location: [facility];  Service: Gynecology;  Laterality: N/A;  . THERAPEUTIC ABORTION     Family History  Problem Relation Age of Onset  . Hypertension Father    Social History   Tobacco Use  . Smoking status: Former Smoker    Packs/day: 0.25    Years: 7.00    Pack years: 1.75    Types: Cigars  . Smokeless tobacco: Never Used  Substance Use Topics  . Alcohol use: No  . Drug use: No   No Known Allergies Current Outpatient Medications on File Prior to Visit  Medication Sig Dispense Refill  . Prenatal Vit-Fe Fumarate-FA (PRENATAL MULTIVITAMIN) TABS tablet Take 1 tablet by mouth daily at 12 noon.    . cefadroxil (DURICEF) 500 MG capsule Take 1 capsule (500 mg total) by mouth 2 (two) times daily. (Patient not taking: Reported on 07/18/2019) 14 capsule 0  . Doxylamine-Pyridoxine  10-10 MG TBEC Take 1 tablet by mouth in the morning. (Patient not taking: Reported on 07/18/2019) 30 tablet 0  . metoCLOPramide (REGLAN) 10 MG tablet Take 1 tablet (10 mg total) by mouth every 6 (six) hours as needed for nausea or vomiting. (Patient not taking: Reported on 07/18/2019) 30 tablet 0  . scopolamine (TRANSDERM-SCOP) 1 MG/3DAYS Place 1 patch (1.5 mg total) onto the skin every 3 (three) days. (Patient not taking: Reported on 07/18/2019) 10 patch 12  . [DISCONTINUED] cetirizine (ZYRTEC) 10 MG tablet Take 1 tablet (10 mg total) by mouth daily. 30 tablet 0  . [DISCONTINUED] dicyclomine (BENTYL) 20 MG tablet Take 1 tablet (20 mg total) by mouth 2 (two) times daily. 20 tablet 0   No current facility-administered medications on file prior to visit.    Review of Systems Pertinent items noted in HPI and remainder of comprehensive ROS otherwise negative.  Exam   Vitals:   07/18/19 0946  BP: 114/69  Pulse: 66  Weight: 147 lb 11.2 oz (67 kg)   Fetal Heart Rate (bpm): 154  Physical Exam  Constitutional: She is oriented to person, place, and time and well-developed, well-nourished, and in no distress. No  distress.  Cardiovascular: Normal rate.  Pulmonary/Chest: Effort normal.  Abdominal: Soft. There is no abdominal tenderness. There is no rebound.  Genitourinary:    Genitourinary Comments:  External: no lesion Vagina: small amount of white discharge Cervix: pink, smooth, no CMT Uterus: approx 10 weeks size  Adnexa: NT   Neurological: She is alert and oriented to person, place, and time.  Skin: Skin is warm and dry.  Psychiatric: Affect normal.  Nursing note and vitals reviewed.  Assessment:   Pregnancy: [id number] Patient Active Problem List   Diagnosis Date Noted  . Supervision of other normal pregnancy, antepartum 07/18/2019  . History of IUFD 07/18/2019  . Bacterial vaginitis 12/25/2016  . Missed ab 07/15/2016  . Sickle cell trait ([facility])      Plan:  1. Supervision of other normal pregnancy, antepartum - Cytology - PAP( [place]) - Culture, OB Urine - Cervicovaginal ancillary only( [place]) - Obstetric Panel, Including HIV - Genetic Screening - Babyscripts Schedule Optimization - [place] MFM OB COMP + 14 WK; Future   Initial labs drawn. Continue prenatal vitamins. Genetic Screening discussed, AFP and NIPS: requested. Ultrasound discussed; fetal anatomic survey: requested. Problem list reviewed and updated. The nature of  - [facility] with multiple MDs and other Advanced Practice Providers was explained to patient; also emphasized that residents, students are part of our team. Routine obstetric precautions reviewed. 50% of 45 min visit spent in counseling and coordination of care. Return in about 8 weeks (around 09/12/2019) for virtual visit .   Thressa Sheller DNP, CNM  07/18/19  10:30 AM

## 2019-07-18 NOTE — Addendum Note (Signed)
Addended by: Natale Milch D on: 07/18/2019 10:54 AM   Modules accepted: Orders

## 2019-07-18 NOTE — Progress Notes (Signed)
74

## 2019-07-19 LAB — OBSTETRIC PANEL, INCLUDING HIV
Antibody Screen: NEGATIVE
Basophils Absolute: 0 10*3/uL (ref 0.0–0.2)
Basos: 1 %
EOS (ABSOLUTE): 0.1 10*3/uL (ref 0.0–0.4)
Eos: 1 %
HIV Screen 4th Generation wRfx: NONREACTIVE
Hematocrit: 44.6 % (ref 34.0–46.6)
Hemoglobin: 14.2 g/dL (ref 11.1–15.9)
Hepatitis B Surface Ag: NEGATIVE
Immature Grans (Abs): 0 10*3/uL (ref 0.0–0.1)
Immature Granulocytes: 0 %
Lymphocytes Absolute: 1.1 10*3/uL (ref 0.7–3.1)
Lymphs: 19 %
MCH: 25.3 pg — ABNORMAL LOW (ref 26.6–33.0)
MCHC: 31.8 g/dL (ref 31.5–35.7)
MCV: 79 fL (ref 79–97)
Monocytes Absolute: 0.5 10*3/uL (ref 0.1–0.9)
Monocytes: 9 %
Neutrophils Absolute: 4.3 10*3/uL (ref 1.4–7.0)
Neutrophils: 70 %
Platelets: 301 10*3/uL (ref 150–450)
RBC: 5.62 x10E6/uL — ABNORMAL HIGH (ref 3.77–5.28)
RDW: 13.7 % (ref 11.7–15.4)
RPR Ser Ql: NONREACTIVE
Rh Factor: POSITIVE
Rubella Antibodies, IGG: 3.38 index (ref >0.99)
WBC: 6.1 10*3/uL (ref 3.4–10.8)

## 2019-07-19 LAB — CYTOLOGY - PAP
Comment: NEGATIVE
High risk HPV: NEGATIVE

## 2019-07-19 LAB — CERVICOVAGINAL ANCILLARY ONLY
Bacterial Vaginitis (gardnerella): NEGATIVE
Candida Glabrata: NEGATIVE
Candida Vaginitis: POSITIVE — AB
Chlamydia: NEGATIVE
Comment: NEGATIVE
Comment: NEGATIVE
Comment: NEGATIVE
Comment: NEGATIVE
Comment: NEGATIVE
Comment: NORMAL
Neisseria Gonorrhea: NEGATIVE
Trichomonas: NEGATIVE

## 2019-07-20 LAB — CULTURE, OB URINE

## 2019-07-20 LAB — URINE CULTURE, OB REFLEX

## 2019-07-24 ENCOUNTER — Encounter: Payer: Self-pay | Admitting: Obstetrics and Gynecology

## 2019-07-27 ENCOUNTER — Encounter: Payer: Self-pay | Admitting: Obstetrics and Gynecology

## 2019-07-27 DIAGNOSIS — D563 Thalassemia minor: Secondary | ICD-10-CM

## 2019-08-07 ENCOUNTER — Ambulatory Visit: Payer: Self-pay | Admitting: Genetic Counselor

## 2019-08-07 ENCOUNTER — Other Ambulatory Visit: Payer: Self-pay

## 2019-08-07 ENCOUNTER — Ambulatory Visit: Payer: Medicaid Other | Attending: Obstetrics and Gynecology | Admitting: Genetic Counselor

## 2019-08-07 DIAGNOSIS — Z3A12 12 weeks gestation of pregnancy: Secondary | ICD-10-CM

## 2019-08-07 DIAGNOSIS — Z315 Encounter for genetic counseling: Secondary | ICD-10-CM | POA: Diagnosis not present

## 2019-08-07 DIAGNOSIS — D573 Sickle-cell trait: Secondary | ICD-10-CM

## 2019-08-07 DIAGNOSIS — D563 Thalassemia minor: Secondary | ICD-10-CM

## 2019-08-07 DIAGNOSIS — O99011 Anemia complicating pregnancy, first trimester: Secondary | ICD-10-CM | POA: Diagnosis not present

## 2019-08-07 NOTE — Progress Notes (Signed)
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 Evelyn [name] came to her appointment alone due to COVID-19 visitor restrictions.   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 female. Her current pregnancy has completed [redacted]w[redacted]d (Estimated Date of Delivery: 02/13/20). Evelyn [name] has an 90 year old daughter, a 47 year old daughter, and a 71 year old son from prior relationships. She also has had two elective terminations and a spontaneous miscarriage at 13 weeks with a prior partner. She had a stillborn son, Laban Emperor, at 77-37 weeks with a previous partner due to "placental problems". This is her first pregnancy with her current partner.  Evelyn [name] denied exposure to environmental toxins or chemical agents. She denied the use of alcohol, tobacco or street drugs. She reported taking nausea medication. She denied significant viral illnesses, fevers, and bleeding during the course of her pregnancy. Her medical and surgical histories were noncontributory.  Family History  A three generation pedigree was drafted and reviewed. The family history is remarkable for the following:  - Evelyn. Piechocki's mother was recently diagnosed with breast cancer at the age of 88. She reportedly had a lumpectomy and is beginning radiation. Evelyn [name] does not know if her mother had genetic testing performed as they do not really discuss the topic of her cancer. Though most cancers are thought to be sporadic or due to environmental factors, some families appear to have a strong predisposition to cancers. When considering a family history of cancer, we look for common types of cancer in multiple family members occurring at younger  than typical ages. We discussed the option of meeting with a cancer genetic counselor to discuss any possible screening or testing options available. If Evelyn [name] is concerned about the family history of cancer and would like to learn more about the family's chance for an inherited cancer syndrome, she or her healthcare provider may refer her to the [facility] [phone]).   - Evelyn [name]'s fiance Evelyn [name] has type I diabetes that was diagnosed at the age of 24. He has a cousin in his 13s who also has type I diabetes. We discussed that type I diabetes is one condition in the family of conditions known as autoimmune conditions. Autoimmune conditions occur when an individual's body launches an abnormal immune response and begins to destroy its own cells. There are several different autoimmune conditions. While we do know that autoimmune conditions tend to "cluster" within a family, they do not follow a clear pattern of inheritance. When there is a person in the family with an autoimmune condition, there is an increased chance for others in the family to develop an autoimmune condition, but it may be a different condition. Specific risk factor information is not available. Genetic testing is not available at this time to predict who may develop an autoimmune condition.  The remaining family histories were reviewed and found to be noncontributory for birth defects, intellectual disability, recurrent pregnancy loss, and known genetic conditions. Evelyn [name] had limited information about some of her partner's family history; thus, risk assessment was limited.  The patient's ethnicity is African American. The father of the pregnancy's ethnicity is African American. Ashkenazi Jewish ancestry and consanguinity were denied. Pedigree will be scanned under  Media.  Discussion  Alpha-thalassemia:  Evelyn [name] had [id number] carrier screening performed through [place]. The results of the screen identified her as a  silent carrier for alpha-thalassemia (aa/a-). Alpha-thalassemia is different in its inheritance compared to other hemoglobinopathies as there are two copies of two alpha globin genes (HBA1 and HBA2) on each chromosome 16, or four alpha globin genes total (aa/aa). A person can be a carrier of one alpha gene mutation (aa/a-), also referred to as a "silent carrier". A person who carries two alpha globin gene mutations can either carry them in cis (both on the same chromosome, denoted as aa/--) or in trans (on different chromosomes, denoted as a-/a-). Alpha-thalassemia carriers of two mutations who have African American ancestry are more likely to have a trans arrangement (a-/a-); cis configuration is reported to be rare in individuals with African American ancestry.     There are several different forms of alpha-thalassemia. The most severe form of alpha-thalassemia, Hb Barts, is associated with an absence of alpha globin chain synthesis as a result of deletions of all four alpha globin genes (--/--).  Given that Evelyn [name] is a silent carrier (aa/a-), her pregnancies would not be at increased risk for Hb Barts, even if her partner is a carrier for alpha-thalassemia, as she will always pass on at least one copy of the alpha globin gene to her children. Hemoglobin H (HbH) disease is caused by three deleted or dysfunctioning alpha globin alleles (a-/--) and is characterized by microcytic hypochromic hemolytic anemia, hepatosplenomegaly, mild jaundice, growth retardation, and sometimes thalassemia-like bone changes. Given Evelyn [name]'s silent carrier status (aa/a-), the current fetus would only be at risk for HbH disease (a-/--), if her partner is a carrier for two alpha globin mutations in cis (aa/--). If this is the case, the risk for HbH disease in the pregnancy would be 1 in 4 (25%). However, if Evelyn [name]'s partner is a carrier for two alpha globin mutations, he would be more likely to carry them in trans configuration  (a-/a-) than the cis configuration (aa/--), given his ethnicity. If he is a carrier of alpha-thalassemia in trans, then the pregnancy would not be at increased risk for HbH disease. Based on the carrier frequency for alpha-thalassemia in the African American population, Evelyn [name]'s partner has a 1 in 30 chance of being any type of carrier for alpha-thalassemia.   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 We discussed that SCA is one condition in a group of blood disorders that affect hemoglobin in red blood cells (hemoglobinopathies). Hemoglobin is a protein that transports oxygen from the lungs to organs and tissues throughout the body. Individuals with SCA have an inherited structural abnormality in hemoglobin's beta globin chains due to a single amino acid change in the HBB gene. Instead of producing normal adult hemoglobin (Hb A), individuals with SCA produce an atypical form of hemoglobin called hemoglobin S (Hb S). Typically, individuals are expected to have two copies of Hb A (Hb AA). Individuals who are carriers of SCA have one copy of Hb A and one copy of Hb S (Hb AS), whereas individuals affected by SCA have two copies of Hb S (Hb SS). Carriers of SCA are often said to have sickle cell "trait".   Hb S alters the configuration of the hemoglobin molecule. As a result, individuals with SCA have red blood cells  that can sickle and obstruct blood flow in small blood vessels, causing ischemia of tissues and organs and episodes of vaso-occlusive crisis. The amino acid change in the HBB gene also causes red blood cells to become fragile and break down easily, which results in chronic anemia. Additional complications associated with SCA may include organ damage, frequent infections, acute chest syndrome, ischemic stroke, splenic sequestration, priapism, and pulmonary  hypertension. SCA is inherited in an autosomal recessive pattern, where both parents must carry Hb S trait to be at risk of having an affected child. Given his ethnicity, Evelyn [name]'s partner has a 1 in 11 chance of carrying Hb S trait. If Evelyn [name]'s partner were also a carrier of SCA, they would have a 1 in 4 (25%) chance of having a child with SCA.    Hb S is just one variant form of hemoglobin caused by a mutations in the HBB gene. It is also possible that Evelyn [name]'s partner could carry another variant form of hemoglobin, such as hemoglobin C (Hb C). Given his ethnicity, Evelyn. Nuon's partner has a 1 in 38 chance of carrying Hb C trait. If he did, the couple would have a 1 in 4 (25%) chance of having a child with hemoglobin [place] disease (HbSC disease). Individuals with HbSC disease have red blood cells that contain both Hb S and Hb C. These variant forms of hemoglobin can cause red blood cells to become rigid and sickle, blocking small blood vessels and making it difficult for the red blood cells to deliver oxygen to the body's tissues. This can cause severe pain and organ damage, just as we see in individuals with SCA. Individuals with HbSC disease are at risk of the same complications as those associated with SCA, such as pain crises, acute chest syndrome, infections, asplenia, and strokes; however, these complications may occur at a lesser frequency and may be milder.   Finally, Evelyn. Dzik's partner may have a different variant in the HBB gene that could make a carrier of beta-thalassemia. Given his ethnicity, Evelyn. Milles's partner has a 1 in 16 chance of being a carrier. If he did, the couple would have a 1 in 4 (25%) chance of having a child with sickle beta thalassemia. The severity of sickle beta thalassemia depends on the normal amount of beta globin that is produced. If an individual produces no beta globin (sickle beta zero thalassemia), they will experience symptoms similar to SCA. If an individual  produces a reduced amount of beta globin (sickle beta plus thalassemia), they may experience symptoms that are similar to a milder form of SCA.  Evelyn. Dershem carrier screening was negative for the other 12 conditions screened. Thus, her risk to be a carrier for these additional conditions (listed separately in the laboratory report) has been reduced but not eliminated. This also significantly reduces her risk of having a child affected by one of these conditions. We discussed that carrier testing for alpha-thalassemia, hemoglobinopathies, and beta-thalassemia is recommended for Evelyn. Brindle's partner. Evelyn. Huyser indicated that she is interested in pursuing partner carrier screening.  Aneuploidy screening:  We also reviewed that Evelyn. Harvell had Panorama NIPS through the laboratory [facility] that was low-risk for fetal aneuploidies. We reviewed that these results showed a less than 1 in 10,000 risk for trisomies 21, 18 and 13, and monosomy X (Turner syndrome).  In addition, the risk for triploidy and sex chromosome trisomies (47,XXX and 47,XXY) was also low. Evelyn. Taylor elected to have cfDNA analysis for 22q11.2  deletion syndrome, which was also low risk (1 in 9000). We reviewed that while this testing identifies 94-99% of pregnancies with trisomy 22, trisomy 40, trisomy 69, sex chromosome aneuploidies, and triploidy, it is NOT diagnostic. A positive test result requires confirmation by CVS or amniocentesis, and a negative test result does not rule out a fetal chromosome abnormality. She also understands that this testing does not identify all genetic conditions.  Diagnostic testing:  Evelyn. Mendolia was also counseled regarding the option of diagnostic testing via chorionic villus sampling (CVS) or amniocentesis. We discussed the technical aspects of each procedure and quoted up to a 1 in 500 (0.2%) risk for spontaneous pregnancy loss or other adverse pregnancy outcomes as a result of either procedure. Cultured cells from  either a placental or amniotic fluid sample allow for the visualization of a fetal karyotype, which can detect >99% of chromosomal aberrations. Chromosomal microarray can also be performed to identify smaller deletions or duplications of fetal chromosomal material. CVS or amniocentesis could also be performed to assess whether the baby is affected by alpha-thalassemia, SCA, HbSC disease, or sickle beta thalassemia. After careful consideration, Evelyn. Swearengin declined diagnostic testing at this time. She understands that diagnostic testing is available at any point through the end of pregnancy and that she may opt to undergo the procedure at a later date should she change her mind.  Plan:  Evelyn. Paterson is interested in pursuing carrier screening for alpha-thalassemia, hemoglobinopathies, and beta-thalassemia for her fiance, Evelyn [name]; however, he does not have health insurance. I informed Evelyn. Prowell that I may be able to get free testing for him through the laboratory [facility]'s Patient Assistance Program. She requested to discuss the option of testing with him prior to placing the order. We made a plan for her to contact me after discussing testing with Evelyn [name]. I can facilitate sample collection and the testing process from there. Evelyn. Myrick was agreeable with this plan.   I counseled Evelyn. Stribling regarding the above risks and available options. The approximate face-to-face time with the genetic counselor was 40 minutes.  In summary:  Discussed carrier screening results and options for follow-up testing  Silent carrier for alpha-thalassemia  Carrier of sickle cell trait  Desires partner carrier screening. She will contact me once she has discussed testing with her partner. I will facilitate testing from there  Reviewed low-risk NIPS result  Reduction in risk for Down syndrome, trisomy 40, trisomy 57, triploidy, sex chromosome aneuploidies, and 22q11.2 deletion syndrome  Offered additional testing and  screening  Declined chorionic villus sampling  Reviewed family history concerns   Gershon Crane, Evelyn, Aeronautical engineer

## 2019-08-10 ENCOUNTER — Inpatient Hospital Stay (HOSPITAL_COMMUNITY)
Admission: AD | Admit: 2019-08-10 | Discharge: 2019-08-10 | Disposition: A | Discharge status: Home / Self Care | Payer: Medicaid Other | Attending: Obstetrics & Gynecology | Admitting: Obstetrics & Gynecology

## 2019-08-10 ENCOUNTER — Other Ambulatory Visit: Payer: Self-pay

## 2019-08-10 ENCOUNTER — Encounter (HOSPITAL_COMMUNITY): Payer: Self-pay | Admitting: Obstetrics & Gynecology

## 2019-08-10 DIAGNOSIS — R519 Headache, unspecified: Secondary | ICD-10-CM | POA: Insufficient documentation

## 2019-08-10 DIAGNOSIS — O26891 Other specified pregnancy related conditions, first trimester: Secondary | ICD-10-CM | POA: Insufficient documentation

## 2019-08-10 DIAGNOSIS — Z79899 Other long term (current) drug therapy: Secondary | ICD-10-CM | POA: Diagnosis not present

## 2019-08-10 DIAGNOSIS — Z87891 Personal history of nicotine dependence: Secondary | ICD-10-CM | POA: Diagnosis not present

## 2019-08-10 DIAGNOSIS — G44209 Tension-type headache, unspecified, not intractable: Secondary | ICD-10-CM | POA: Diagnosis not present

## 2019-08-10 DIAGNOSIS — Z3A13 13 weeks gestation of pregnancy: Secondary | ICD-10-CM | POA: Diagnosis not present

## 2019-08-10 DIAGNOSIS — O99352 Diseases of the nervous system complicating pregnancy, second trimester: Secondary | ICD-10-CM

## 2019-08-10 DIAGNOSIS — O209 Hemorrhage in early pregnancy, unspecified: Secondary | ICD-10-CM | POA: Insufficient documentation

## 2019-08-10 DIAGNOSIS — Z3490 Encounter for supervision of normal pregnancy, unspecified, unspecified trimester: Secondary | ICD-10-CM

## 2019-08-10 DIAGNOSIS — Z8759 Personal history of other complications of pregnancy, childbirth and the puerperium: Secondary | ICD-10-CM | POA: Insufficient documentation

## 2019-08-10 DIAGNOSIS — D573 Sickle-cell trait: Secondary | ICD-10-CM | POA: Diagnosis not present

## 2019-08-10 LAB — URINALYSIS, ROUTINE W REFLEX MICROSCOPIC
Bilirubin Urine: NEGATIVE
Glucose, UA: NEGATIVE mg/dL
Hgb urine dipstick: NEGATIVE
Ketones, ur: NEGATIVE mg/dL
Leukocytes,Ua: NEGATIVE
Nitrite: NEGATIVE
Protein, ur: NEGATIVE mg/dL
Specific Gravity, Urine: 1.013 (ref 1.005–1.030)
pH: 7 (ref 5.0–8.0)

## 2019-08-10 LAB — WET PREP, GENITAL
Clue Cells Wet Prep HPF POC: NONE SEEN
Sperm: NONE SEEN
Trich, Wet Prep: NONE SEEN

## 2019-08-10 LAB — CBC
HCT: 36.9 % (ref 36.0–46.0)
Hemoglobin: 12.2 g/dL (ref 12.0–15.0)
MCH: 26.1 pg (ref 26.0–34.0)
MCHC: 33.1 g/dL (ref 30.0–36.0)
MCV: 78.8 fL — ABNORMAL LOW (ref 80.0–100.0)
Platelets: 235 10*3/uL (ref 150–400)
RBC: 4.68 MIL/uL (ref 3.87–5.11)
RDW: 14.2 % (ref 11.5–15.5)
WBC: 5.5 10*3/uL (ref 4.0–10.5)
nRBC: 0 % (ref 0.0–0.2)

## 2019-08-10 MED ORDER — ACETAMINOPHEN 500 MG PO TABS
1000.0000 mg | ORAL_TABLET | Freq: Once | ORAL | Status: AC
  Administered 2019-08-10: 1000 mg via ORAL
  Filled 2019-08-10: qty 2

## 2019-08-10 MED ORDER — ACETAMINOPHEN 325 MG PO TABS: 650.0000 mg | ORAL_TABLET | ORAL | 2 refills | Status: DC | PRN

## 2019-08-10 NOTE — Discharge Instructions (Signed)

## 2019-08-10 NOTE — MAU Note (Signed)
Head was hurting really bad last night.  When she got up this morning, head was still hurting.  When "she puts the headset on at work, that's what makes it hurt".  Did not take anything for it.  Saw some spotting this morning.

## 2019-08-10 NOTE — MAU Provider Note (Signed)
History     CSN: [id number]  Arrival date and time: 08/10/19 1316   Chief Complaint  Patient presents with  . Headache  . Vaginal Bleeding   HPI Evelyn [name] is a 29 y.o. [id number] at 18w2dwho presents to MAU with chief complaint of headache. This is a new problem, onset today. Patient wears a headset at work and reports this is a consistent headache trigger. She rates her pain as 7/10 on arrival to MAU. She has not taken medication or tried other treatments for this complaint.  Patient also endorses new onset spotting, visualized this morning. She denies overt active bleeding. She is remote from sexual intercourse..  [facility] [place] [facility] [place] [facility]   OB History    Gravida  8   Para  4   Term  4   Preterm      AB  3   Living  3     SAB  1   TAB  2   Ectopic      Multiple  0   Live Births  4        Obstetric Comments  TAB, 09/17/17        Past Medical History:  Diagnosis Date  . Anemia   . Eczema   . Prior pregnancy with fetal demise    weeks 368gestation fetal demise  . Sickle cell trait ([place]   . SVD (spontaneous vaginal delivery)    x 4     Past Surgical History:  Procedure Laterality Date  . DILATION AND CURETTAGE OF UTERUS    . DILATION AND EVACUATION N/A 07/16/2016   Procedure: DILATATION AND EVACUATION (D&E) 2ND TRIMESTER;  Surgeon: PDonnamae Jude MD;  [facility];  Service: Gynecology;  Laterality: N/A;  . THERAPEUTIC ABORTION      Family History  Problem Relation Age of Onset  . Hypertension Father     Social History   Tobacco Use  . Smoking status: Former Smoker    Packs/day: 0.25    Years: 7.00    Pack years: 1.75    Types: Cigars  . Smokeless tobacco: Never Used  Substance Use Topics  . Alcohol use: No  . Drug use: No    Allergies: No Known Allergies  Medications Prior to Admission  Medication Sig Dispense Refill Last Dose  . Blood Pressure Monitoring (BLOOD PRESSURE KIT) DEVI 1 kit by Does not apply  route as needed. 1 each 0   . cefadroxil (DURICEF) 500 MG capsule Take 1 capsule (500 mg total) by mouth 2 (two) times daily. (Patient not taking: Reported on 07/18/2019) 14 capsule 0   . Doxylamine-Pyridoxine 10-10 MG TBEC Take 1 tablet by mouth in the morning. (Patient not taking: Reported on 07/18/2019) 30 tablet 0   . metoCLOPramide (REGLAN) 10 MG tablet Take 1 tablet (10 mg total) by mouth every 6 (six) hours as needed for nausea or vomiting. (Patient not taking: Reported on 07/18/2019) 30 tablet 0   . Prenatal Vit-Fe Fumarate-FA (PRENATAL MULTIVITAMIN) TABS tablet Take 1 tablet by mouth daily at 12 noon.     .Marland Kitchenscopolamine (TRANSDERM-SCOP) 1 MG/3DAYS Place 1 patch (1.5 mg total) onto the skin every 3 (three) days. (Patient not taking: Reported on 07/18/2019) 10 patch 12     Review of Systems  Genitourinary: Positive for vaginal bleeding.  Neurological: Positive for headaches.  All other systems reviewed and are negative.  Physical Exam   Blood pressure 113/68, pulse 77, temperature 98.3 F (  36.8 C), temperature source Oral, resp. rate 16, height _0  (1.702 m), weight 67.5 kg, last menstrual period 04/27/2019, SpO2 100 %, unknown if currently breastfeeding.  Physical Exam  Nursing note and vitals reviewed. Constitutional: She is oriented to person, place, and time. She appears well-developed and well-nourished.  Cardiovascular: Normal rate and normal heart sounds.  Respiratory: Effort normal and breath sounds normal.  GI: Soft. There is no abdominal tenderness. There is no CVA tenderness.  Genitourinary:    No vaginal discharge.     Genitourinary Comments: Pelvic exam: External genitalia normal, vaginal walls pink and well rugated, cervix visually closed, no lesions noted. Bimanual: cervix 0/thick/posterior, neg CMT, uterus nontender, no adnexal tenderness noted. No bleeding observed    Neurological: She is alert and oriented to person, place, and time.  Skin: Skin is warm and dry.   Psychiatric: She has a normal mood and affect. Her behavior is normal. Judgment and thought content normal.    MAU Course/MDM  Procedures  Orders Placed This Encounter  Procedures  . Wet prep, genital  . Urinalysis, Routine w reflex microscopic  . CBC  . Discharge patient   Meds ordered this encounter  Medications  . acetaminophen (TYLENOL) tablet 1,000 mg  . acetaminophen (TYLENOL) 325 MG tablet    Sig: Take 2 tablets (650 mg total) by mouth every 4 (four) hours as needed for headache.    Dispense:  100 tablet    Refill:  2    Order Specific Question:   Supervising Provider    Answer:   Wood[place] Mode [id number]   Patient Vitals for the past 24 hrs:  BP Temp Temp src Pulse Resp SpO2 Height Weight  08/10/19 1521 115/75 -- -- 63 16 -- -- --  08/10/19 1331 113/68 98.3 F (36.8 C) Oral 77 16 100 % _1  (1.702 m) 67.5 kg   Results for orders placed or performed during the hospital encounter of 08/10/19 (from the past 24 hour(s))  Urinalysis, Routine w reflex microscopic     Status: None   Collection Time: 08/10/19  1:54 PM  Result Value Ref Range   Color, Urine YELLOW YELLOW   APPearance CLEAR CLEAR   Specific Gravity, Urine 1.013 1.005 - 1.030   pH 7.0 5.0 - 8.0   Glucose, UA NEGATIVE NEGATIVE mg/dL   Hgb urine dipstick NEGATIVE NEGATIVE   Bilirubin Urine NEGATIVE NEGATIVE   Ketones, ur NEGATIVE NEGATIVE mg/dL   Protein, ur NEGATIVE NEGATIVE mg/dL   Nitrite NEGATIVE NEGATIVE   Leukocytes,Ua NEGATIVE NEGATIVE  Wet prep, genital     Status: Abnormal   Collection Time: 08/10/19  2:10 PM   Specimen: PATH Cytology Cervicovaginal Ancillary Only  Result Value Ref Range   Yeast Wet Prep HPF POC PRESENT (A) NONE SEEN   Trich, Wet Prep NONE SEEN NONE SEEN   Clue Cells Wet Prep HPF POC NONE SEEN NONE SEEN   WBC, Wet Prep HPF POC MODERATE (A) NONE SEEN   Sperm NONE SEEN   CBC     Status: Abnormal   Collection Time: 08/10/19  2:31 PM  Result Value Ref Range   WBC 5.5 4.0 -  10.5 K/uL   RBC 4.68 3.87 - 5.11 MIL/uL   Hemoglobin 12.2 12.0 - 15.0 g/dL   HCT 36.9 36.0 - 46.0 %   MCV 78.8 (L) 80.0 - 100.0 fL   MCH 26.1 26.0 - 34.0 pg   MCHC 33.1 30.0 - 36.0 g/dL   RDW 14.2 11.5 -  15.5 %   Platelets 235 150 - 400 K/uL   nRBC 0.0 0.0 - 0.2 %   Assessment and Plan  --29 y.o. [id number] at [redacted]w[redacted]d --FHT 147 by Doppler --No bleeding on exam --Blood type B POS --Wet prep not consistent with physical exam. Will not treat for yeast --Headache resolved with Tylenol --Discharge home in stable condition  F/U: --CJuno RidgePRN  SDarlina Rumpf CNM 08/10/2019, 4:39 PM

## 2019-08-13 LAB — GC/CHLAMYDIA PROBE AMP (~~LOC~~) NOT AT ARMC
Chlamydia: NEGATIVE
Comment: NEGATIVE
Comment: NORMAL
Neisseria Gonorrhea: NEGATIVE

## 2019-09-12 ENCOUNTER — Ambulatory Visit (INDEPENDENT_AMBULATORY_CARE_PROVIDER_SITE_OTHER): Payer: Medicaid Other | Admitting: Obstetrics and Gynecology

## 2019-09-12 ENCOUNTER — Other Ambulatory Visit: Payer: Self-pay

## 2019-09-12 ENCOUNTER — Encounter: Payer: Self-pay | Admitting: Obstetrics and Gynecology

## 2019-09-12 ENCOUNTER — Other Ambulatory Visit (HOSPITAL_COMMUNITY)
Admission: RE | Admit: 2019-09-12 | Discharge: 2019-09-12 | Disposition: A | Discharge status: Home / Self Care | Payer: Medicaid Other | Source: Ambulatory Visit | Attending: Obstetrics and Gynecology | Admitting: Obstetrics and Gynecology

## 2019-09-12 VITALS — BP 116/72 | HR 65 | Wt 152.4 lb

## 2019-09-12 DIAGNOSIS — N898 Other specified noninflammatory disorders of vagina: Secondary | ICD-10-CM

## 2019-09-12 DIAGNOSIS — O98812 Other maternal infectious and parasitic diseases complicating pregnancy, second trimester: Secondary | ICD-10-CM

## 2019-09-12 DIAGNOSIS — Z3480 Encounter for supervision of other normal pregnancy, unspecified trimester: Secondary | ICD-10-CM

## 2019-09-12 DIAGNOSIS — Z3A18 18 weeks gestation of pregnancy: Secondary | ICD-10-CM

## 2019-09-12 DIAGNOSIS — B373 Candidiasis of vulva and vagina: Secondary | ICD-10-CM

## 2019-09-12 NOTE — Progress Notes (Signed)
ROB  CC: Back Pain pt notes taking tylenol for relief.  Vaginal Discharge. Pt wants evaluation denies any odor notices itching.    Pt also gets HA's everyday states water intake is pretty good. Takes tylenol to help.

## 2019-09-12 NOTE — Progress Notes (Signed)
   LOW-RISK PREGNANCY OFFICE VISIT Patient name: Evelyn [name] MRN [id number]  Date of birth: 1990/10/14 Chief Complaint:   Routine Prenatal Visit  History of Present Illness:   Evelyn [name] is a 29 y.o. [id number] female at [redacted]w[redacted]d with an Estimated Date of Delivery: 02/13/20 being seen today for ongoing management of a low-risk pregnancy.  Today she reports backache, headache, vaginal irritation and thick, white vaginal discharge that are both relieved with Tylenol. Contractions: Not present. Vag. Bleeding: None.  Movement: Present. denies leaking of fluid. Review of Systems:   Pertinent items are noted in HPI Denies abnormal vaginal discharge w/ itching/odor/irritation, headaches, visual changes, shortness of breath, chest pain, abdominal pain, severe nausea/vomiting, or problems with urination or bowel movements unless otherwise stated above. Pertinent History Reviewed:  Reviewed past medical,surgical, social, obstetrical and family history.  Reviewed problem list, medications and allergies. Physical Assessment:   Vitals:   09/12/19 0926  BP: 116/72  Pulse: 65  Weight: 152 lb 6.4 oz (69.1 kg)  Body mass index is 23.87 kg/m.        Physical Examination:   General appearance: Well appearing, and in no distress  Mental status: Alert, oriented to person, place, and time  Skin: Warm & dry  Cardiovascular: Normal heart rate noted  Respiratory: Normal respiratory effort, no distress  Abdomen: Soft, gravid, nontender  Pelvic: Cervical exam deferred - wet prep obtained        Extremities: Edema: None  Fetal Status: Fetal Heart Rate (bpm): 140   Movement: Present   S=D  No results found for this or any previous visit (from the past 24 hour(s)).  Assessment & Plan:  1) Low-risk pregnancy [id number] at [redacted]w[redacted]d with an Estimated Date of Delivery: 02/13/20   2) Supervision of other normal pregnancy, antepartum  3) Vaginal irritation  - Cervicovaginal ancillary only( [place])    Meds: No  orders of the defined types were placed in this encounter.  Labs/procedures today: wet prep  Plan:  Continue routine obstetrical care   Reviewed: Preterm labor symptoms and general obstetric precautions including but not limited to vaginal bleeding, contractions, leaking of fluid and fetal movement were reviewed in detail with the patient.  All questions were answered. Has home bp cuff. Check bp weekly, let us know if >140/90.   Follow-up: Return in about 4 weeks (around 10/10/2019) for Return OB visit, Return OB - My Chart video.  No orders of the defined types were placed in this encounter.  Raelyn Mora MSN, CNM 09/12/2019 9:51 AM

## 2019-09-12 NOTE — Progress Notes (Deleted)
Pt also gets HA's everyday states water intake is pretty good. Takes tylenol to help.

## 2019-09-13 ENCOUNTER — Other Ambulatory Visit: Payer: Self-pay | Admitting: Obstetrics and Gynecology

## 2019-09-13 DIAGNOSIS — B3731 Acute candidiasis of vulva and vagina: Secondary | ICD-10-CM

## 2019-09-13 LAB — CERVICOVAGINAL ANCILLARY ONLY
Bacterial Vaginitis (gardnerella): NEGATIVE
Candida Glabrata: NEGATIVE
Candida Vaginitis: POSITIVE — AB
Chlamydia: NEGATIVE
Comment: NEGATIVE
Comment: NEGATIVE
Comment: NEGATIVE
Comment: NEGATIVE
Comment: NEGATIVE
Comment: NORMAL
Neisseria Gonorrhea: NEGATIVE
Trichomonas: NEGATIVE

## 2019-09-13 MED ORDER — FLUCONAZOLE 150 MG PO TABS: 150.0000 mg | ORAL_TABLET | Freq: Every day | ORAL | 1 refills | Status: DC

## 2019-09-17 ENCOUNTER — Telehealth: Payer: Self-pay

## 2019-09-17 NOTE — Telephone Encounter (Signed)
-----   Message from Raelyn Mora, [place] sent at 09/13/2019  4:48 PM EDT ----- Please inform this patient of her yeast infection and medication has been sent to her pharmacy

## 2019-09-17 NOTE — Telephone Encounter (Signed)
TC to pt to make aware of results no answer LMOVM.

## 2019-09-18 ENCOUNTER — Telehealth: Payer: Self-pay

## 2019-09-18 NOTE — Telephone Encounter (Signed)
I was able to reach pt and make her aware of her recent vaginal swab results. Pt voiced understanding.

## 2019-09-24 ENCOUNTER — Encounter: Payer: Self-pay | Admitting: Obstetrics and Gynecology

## 2019-09-26 ENCOUNTER — Ambulatory Visit: Payer: Medicaid Other

## 2019-09-26 ENCOUNTER — Encounter (HOSPITAL_COMMUNITY): Payer: Self-pay

## 2019-09-26 ENCOUNTER — Ambulatory Visit (HOSPITAL_COMMUNITY)
Admission: EM | Admit: 2019-09-26 | Discharge: 2019-09-26 | Disposition: A | Discharge status: Home / Self Care | Payer: Medicaid Other | Attending: Family Medicine | Admitting: Family Medicine

## 2019-09-26 ENCOUNTER — Other Ambulatory Visit: Payer: Self-pay

## 2019-09-26 DIAGNOSIS — L03116 Cellulitis of left lower limb: Secondary | ICD-10-CM

## 2019-09-26 DIAGNOSIS — L309 Dermatitis, unspecified: Secondary | ICD-10-CM

## 2019-09-26 MED ORDER — TRIAMCINOLONE ACETONIDE 0.1 % EX CREA
1.0000 | TOPICAL_CREAM | Freq: Two times a day (BID) | CUTANEOUS | 0 refills | Status: DC
Start: 2019-09-26 — End: 2020-01-27

## 2019-09-26 MED ORDER — CEPHALEXIN 500 MG PO CAPS
500.0000 mg | ORAL_CAPSULE | Freq: Three times a day (TID) | ORAL | 0 refills | Status: AC
Start: 2019-09-26 — End: 2019-10-03

## 2019-09-26 NOTE — ED Triage Notes (Signed)
Pt presents to UC for rash that has been present for "a while on and off". Pt reports rash is on bilateral legs, and arms. Pt complaining of itching, no pain, or drainage from rash. Pt concerned for bug bites v. Eczema. Pt denies OTC treatments or relieving factors.

## 2019-09-26 NOTE — ED Provider Notes (Signed)
[facility]    CSN: [id number] Arrival date & time: 09/26/19  1501      History   Chief Complaint Chief Complaint  Patient presents with   Rash    HPI Evelyn [name] is a 29 y.o. female.   Evelyn [name] presents with complaints of itching rash to bilateral forearms which has been waxing and waning. She has a history of eczema, as does her daughter. States she has used her daughters eczema cream which does seem to help short term. She is [redacted] weeks pregnant. Also with area which is painful and red to left lateral leg. It started out itching but then noted a central area of drainage, and now is red. No further drainage. No current rash to abdomen. No itching to palms or feet. No fevers   ROS per HPI, negative if not otherwise mentioned.      Past Medical History:  Diagnosis Date   Anemia    Eczema    Prior pregnancy with fetal demise    weeks 38 gestation fetal demise   Sickle cell trait ([place])    SVD (spontaneous vaginal delivery)    x 4     Patient Active Problem List   Diagnosis Date Noted   Alpha thalassemia silent carrier 07/27/2019   Supervision of other normal pregnancy, antepartum 07/18/2019   History of IUFD 07/18/2019   Bacterial vaginitis 12/25/2016   Missed ab 07/15/2016   Sickle cell trait ([place])     Past Surgical History:  Procedure Laterality Date   DILATION AND CURETTAGE OF UTERUS     DILATION AND EVACUATION N/A 07/16/2016   Procedure: DILATATION AND EVACUATION (D&E) 2ND TRIMESTER;  Surgeon: Donnamae Jude, MD;  Location: [facility];  Service: Gynecology;  Laterality: N/A;   THERAPEUTIC ABORTION      OB History    Gravida  8   Para  4   Term  4   Preterm      AB  3   Living  3     SAB  1   TAB  2   Ectopic      Multiple  0   Live Births  4        Obstetric Comments  TAB, 09/17/17         Home Medications    Prior to Admission medications   Medication Sig Start Date End Date Taking? Authorizing  Provider  acetaminophen (TYLENOL) 325 MG tablet Take 2 tablets (650 mg total) by mouth every 4 (four) hours as needed for headache. 08/10/19 08/09/20  Darlina Rumpf, CNM  Blood Pressure Monitoring (BLOOD PRESSURE KIT) DEVI 1 kit by Does not apply route as needed. Patient not taking: Reported on 09/12/2019 07/18/19   Tresea Mall, CNM  cephALEXin (KEFLEX) 500 MG capsule Take 1 capsule (500 mg total) by mouth 3 (three) times daily for 7 days. 09/26/19 10/03/19  Zigmund Gottron, NP  Doxylamine-Pyridoxine 10-10 MG TBEC Take 1 tablet by mouth in the morning. Patient not taking: Reported on 09/12/2019 06/11/19   Darr, Marguerita Beards, PA-C  fluconazole (DIFLUCAN) 150 MG tablet Take 1 tablet (150 mg total) by mouth daily. 09/13/19   Laury Deep, CNM  metoCLOPramide (REGLAN) 10 MG tablet Take 1 tablet (10 mg total) by mouth every 6 (six) hours as needed for nausea or vomiting. Patient not taking: Reported on 09/12/2019 06/18/19   Julianne Handler, CNM  Prenatal Vit-Fe Fumarate-FA (PRENATAL MULTIVITAMIN) TABS tablet Take 1 tablet by mouth  daily at 12 noon.    [provider]  triamcinolone cream (KENALOG) 0.1 % Apply 1 application topically 2 (two) times daily. Stop use after 10 days 09/26/19   Augusto Gamble B, NP  cetirizine (ZYRTEC) 10 MG tablet Take 1 tablet (10 mg total) by mouth daily. 06/27/18 05/09/19  Loura Halt A, NP  dicyclomine (BENTYL) 20 MG tablet Take 1 tablet (20 mg total) by mouth 2 (two) times daily. 06/11/19 06/11/19  Darr, Marguerita Beards, PA-C    Family History Family History  Problem Relation Age of Onset   Hypertension Father     Social History Social History   Tobacco Use   Smoking status: Former Smoker    Packs/day: 0.25    Years: 7.00    Pack years: 1.75    Types: Cigars   Smokeless tobacco: Never Used  Scientific laboratory technician Use: Never used  Substance Use Topics   Alcohol use: No   Drug use: No     Allergies   Patient has no known allergies.   Review of  Systems Review of Systems   Physical Exam Triage Vital Signs ED Triage Vitals  Enc Vitals Group     BP 09/26/19 1550 118/68     Pulse Rate 09/26/19 1550 66     Resp 09/26/19 1550 16     Temp 09/26/19 1550 98.2 F (36.8 C)     Temp Source 09/26/19 1550 Oral     SpO2 09/26/19 1550 100 %     Weight --      Height --      Head Circumference --      Peak Flow --      Pain Score 09/26/19 1551 0     Pain Loc --      Pain Edu? --      Excl. in [place]? --    No data found.  Updated Vital Signs BP 118/68 (BP Location: Right Arm)    Pulse 66    Temp 98.2 F (36.8 C) (Oral)    Resp 16    LMP 04/27/2019    SpO2 100%    Physical Exam Constitutional:      General: She is not in acute distress.    Appearance: She is well-developed.  Cardiovascular:     Rate and Rhythm: Normal rate.  Pulmonary:     Effort: Pulmonary effort is normal.  Skin:    General: Skin is warm and dry.          Comments: Hyperpigmented skin noted to bilateral upper arms and forearms with scattered papules noted as well; non tender; no vesicles; approximately 3 cm circular area of redness and tender to left lateral lower leg with central opening noted, no active drainage, no palpable fluctuance  Neurological:     Mental Status: She is alert and oriented to person, place, and time.      UC Treatments / Results  Labs (all labs ordered are listed, but only abnormal results are displayed) Labs Reviewed - No data to display  EKG   Radiology No results found.  Procedures Procedures (including critical care time)  Medications Ordered in UC Medications - No data to display  Initial Impression / Assessment and Plan / UC Course  I have reviewed the triage vital signs and the nursing notes.  Pertinent labs & imaging results that were available during my care of the patient were reviewed by me and considered in my medical decision making (see chart for  details).     Findings consistent with eczema to  arms. Small abscess with cellulitis, has drained, to left lower leg. antibiotics provided. Return precautions provided. Patient verbalized understanding and agreeable to plan.   Final Clinical Impressions(s) / UC Diagnoses   Final diagnoses:  Eczema, unspecified type  Cellulitis of left lower extremity     Discharge Instructions     Warm compresses to your lower leg to promote additional drainage.  Cool and short showers.  Increase your hydration. Use of fragrance free lotion, especially after bathing, to promote moisture to skin   ED Prescriptions    Medication Sig Dispense Auth. Provider   triamcinolone cream (KENALOG) 0.1 % Apply 1 application topically 2 (two) times daily. Stop use after 10 days 30 g Augusto Gamble B, NP   cephALEXin (KEFLEX) 500 MG capsule Take 1 capsule (500 mg total) by mouth 3 (three) times daily for 7 days. 21 capsule Zigmund Gottron, NP     PDMP not reviewed this encounter.   Zigmund Gottron, NP 09/26/19 1942

## 2019-09-26 NOTE — Discharge Instructions (Addendum)
Warm compresses to your lower leg to promote additional drainage.  Cool and short showers.  Increase your hydration. Use of fragrance free lotion, especially after bathing, to promote moisture to skin

## 2019-10-01 ENCOUNTER — Other Ambulatory Visit: Payer: Self-pay | Admitting: *Deleted

## 2019-10-01 ENCOUNTER — Ambulatory Visit: Payer: Medicaid Other | Admitting: *Deleted

## 2019-10-01 ENCOUNTER — Other Ambulatory Visit: Payer: Self-pay

## 2019-10-01 ENCOUNTER — Ambulatory Visit: Payer: Medicaid Other | Attending: Advanced Practice Midwife

## 2019-10-01 VITALS — BP 112/70 | HR 68

## 2019-10-01 DIAGNOSIS — Z8759 Personal history of other complications of pregnancy, childbirth and the puerperium: Secondary | ICD-10-CM | POA: Insufficient documentation

## 2019-10-01 DIAGNOSIS — Z3A20 20 weeks gestation of pregnancy: Secondary | ICD-10-CM

## 2019-10-01 DIAGNOSIS — O0990 Supervision of high risk pregnancy, unspecified, unspecified trimester: Secondary | ICD-10-CM | POA: Insufficient documentation

## 2019-10-01 DIAGNOSIS — Z862 Personal history of diseases of the blood and blood-forming organs and certain disorders involving the immune mechanism: Secondary | ICD-10-CM | POA: Diagnosis not present

## 2019-10-01 DIAGNOSIS — Z3480 Encounter for supervision of other normal pregnancy, unspecified trimester: Secondary | ICD-10-CM | POA: Diagnosis present

## 2019-10-01 DIAGNOSIS — Z363 Encounter for antenatal screening for malformations: Secondary | ICD-10-CM

## 2019-10-01 DIAGNOSIS — O09292 Supervision of pregnancy with other poor reproductive or obstetric history, second trimester: Secondary | ICD-10-CM | POA: Diagnosis not present

## 2019-10-01 DIAGNOSIS — D573 Sickle-cell trait: Secondary | ICD-10-CM | POA: Insufficient documentation

## 2019-10-01 DIAGNOSIS — O09293 Supervision of pregnancy with other poor reproductive or obstetric history, third trimester: Secondary | ICD-10-CM

## 2019-10-10 ENCOUNTER — Telehealth (INDEPENDENT_AMBULATORY_CARE_PROVIDER_SITE_OTHER): Payer: Medicaid Other | Admitting: Obstetrics and Gynecology

## 2019-10-10 DIAGNOSIS — D573 Sickle-cell trait: Secondary | ICD-10-CM

## 2019-10-10 DIAGNOSIS — D563 Thalassemia minor: Secondary | ICD-10-CM

## 2019-10-10 DIAGNOSIS — Z8759 Personal history of other complications of pregnancy, childbirth and the puerperium: Secondary | ICD-10-CM

## 2019-10-10 DIAGNOSIS — Z3480 Encounter for supervision of other normal pregnancy, unspecified trimester: Secondary | ICD-10-CM

## 2019-10-10 DIAGNOSIS — O99012 Anemia complicating pregnancy, second trimester: Secondary | ICD-10-CM

## 2019-10-10 MED ORDER — ASPIRIN 81 MG PO CHEW: 81.0000 mg | CHEWABLE_TABLET | Freq: Once | ORAL | Status: DC

## 2019-10-10 NOTE — Patient Instructions (Signed)

## 2019-10-10 NOTE — Progress Notes (Signed)
OBSTETRICS PRENATAL VIRTUAL VISIT ENCOUNTER NOTE  Provider location: [facility]   I connected with Evelyn [name] on 10/10/19 at  1:30 PM EDT by MyChart Video Encounter at home and verified that I am speaking with the correct person using two identifiers.   I discussed the limitations, risks, security and privacy concerns of performing an evaluation and management service virtually and the availability of in person appointments. I also discussed with the patient that there may be a patient responsible charge related to this service. The patient expressed understanding and agreed to proceed. Subjective:  Evelyn [name] is a 29 y.o. [phone] at 39w0dbeing seen today for ongoing prenatal care.  She is currently monitored for the following issues for this high-risk pregnancy and has Sickle cell trait ([place]; Missed ab; Bacterial vaginitis; Supervision of other normal pregnancy, antepartum; History of IUFD; and Alpha thalassemia silent carrier on their problem list.  Patient reports no complaints.  Contractions: Not present. Vag. Bleeding: None.  Movement: Present. Denies any leaking of fluid.   The following portions of the patient's history were reviewed and updated as appropriate: allergies, current medications, past family history, past medical history, past social history, past surgical history and problem list.   Objective:  There were no vitals filed for this visit.  Fetal Status:     Movement: Present     General:  Alert, oriented and cooperative. Patient is in no acute distress.  Respiratory: Normal respiratory effort, no problems with respiration noted  Mental Status: Normal mood and affect. Normal behavior. Normal judgment and thought content.  Rest of physical exam deferred due to type of encounter  Imaging: UKoreaMFM OB DETAIL +14 WK  Result Date: 10/01/2019 ----------------------------------------------------------------------  OBSTETRICS REPORT                        (Signed Final 10/01/2019 04:18 pm) ---------------------------------------------------------------------- Patient Info  ID #:       [id number]                         D.O.B.:  1June 02, 1992(29 yrs)  Name:       LLINDSEY [name]                  Visit Date: 10/01/2019 08:16 am ---------------------------------------------------------------------- Performed By  Attending:        RTama HighMD        Ref. Address:     [address]                                                            [place] Performed By:     DClaudia Desanctis      Location:         [facility]                                                             [place] [facility]                                                             [facility]  Referred By:      [facility] ---------------------------------------------------------------------- Orders  #  Description                           Code        Ordered By  1  [place] MFM OB DETAIL +14 WK               76811.01    Marcille Buffy ----------------------------------------------------------------------  #  Order #                     Accession #                Episode #  1  [id number]                   [id number]                 [id number] ---------------------------------------------------------------------- Indications  Poor obstetric history: Previous IUFD          O09.299  (stillbirth)  Encounter for antenatal screening for          Z36.3  malformations  Alpha Thalassemia carrier  History of sickle cell trait                   Z86.2  [redacted] weeks gestation of pregnancy                Z3A.20 ---------------------------------------------------------------------- Fetal Evaluation  Num Of Fetuses:         1  Cardiac Activity:       Observed  Presentation:           Variable  Placenta:               Anterior  P. Cord  Insertion:      Visualized, central  Amniotic Fluid  AFI FV:      Within normal limits ---------------------------------------------------------------------- Biometry  BPD:        48  mm     G. Age:  20w 4d         41  %    CI:         71.2   %    70 - 86                                                          FL/HC:  19.0   %    15.9 - 20.3  HC:      181.2  mm     G. Age:  20w 4d         33  %    HC/AC:      1.06        1.06 - 1.25  AC:      171.4  mm     G. Age:  22w 1d         85  %    FL/BPD:     71.9   %  FL:       34.5  mm     G. Age:  20w 6d         47  %    FL/AC:      20.1   %    20 - 24  HUM:      34.7  mm     G. Age:  21w 6d         79  %  CER:      21.9  mm     G. Age:  20w 4d         61  %  NFT:       3.9  mm  CM:        5.9  mm  Est. FW:     421  gm    0 lb 15 oz      81  % ---------------------------------------------------------------------- OB History  Gravidity:    8         Term:   4         SAB:   1  TOP:          2        Living:  3 ---------------------------------------------------------------------- Gestational Age  LMP:           22w 5d        Date:  04/25/19                 EDD:   01/30/20  Clinical EDD:  20w 5d                                        EDD:   02/13/20  U/S Today:     86F 0d                                        EDD:   02/11/20  Best:          20w 5d     Det. ByLoman Chroman         EDD:   02/13/20                                      (06/23/19) ---------------------------------------------------------------------- Anatomy  Cranium:               Appears normal         LVOT:                   Appears normal  Cavum:  Appears normal         Aortic Arch:            Appears normal  Ventricles:            Appears normal         Ductal Arch:            Appears normal  Choroid Plexus:        Appears normal         Diaphragm:              Appears normal  Cerebellum:            Appears normal         Stomach:                Appears normal, left                                                                         sided  Posterior Fossa:       Appears normal         Abdomen:                Appears normal  Nuchal Fold:           Appears normal         Abdominal Wall:         Appears nml (cord                                                                        insert, abd wall)  Face:                  Appears normal         Cord Vessels:           Appears normal (3                         (orbits and profile)                           vessel cord)  Lips:                  Appears normal         Kidneys:                Appear normal  Palate:                Appears normal         Bladder:                Appears normal  Thoracic:              Appears normal         Spine:                  Appears normal  Heart:  Appears normal         Upper Extremities:      Appears normal                         (4CH, axis, and                         situs)  RVOT:                  Appears normal         Lower Extremities:      Appears normal  Other:  Nasal bone visualized. Fetus appears to be female. Heels and 5th          digit visualized. ---------------------------------------------------------------------- Cervix Uterus Adnexa  Cervix  Length:           3.18  cm. ---------------------------------------------------------------------- Impression  G8 T7017.  Patient is here for fetal anatomy scan.  In her first  pregnancy, she had a term stillbirth (unexplained).  Subsequently she had 3 term vaginal deliveries and all her  children are in good health.  On cell free fetal DNA screening, the risks of fetal  aneuploidies are not increased.  Patient is a carrier for sickle  cell trait and a silent carrier for alpha thalassemia.  She had  met with our genetic counselor and had opted not to have  partner screening or invasive testing.  We performed fetal anatomy scan. No makers of  aneuploidies or fetal structural defects are seen. Fetal  biometry is consistent with her  previously-established dates.  Amniotic fluid is normal and good fetal activity is seen.  Patient understands the limitations of ultrasound in detecting  fetal anomalies. ---------------------------------------------------------------------- Recommendations  -Fetal growth assessment at [redacted] weeks gestation. ----------------------------------------------------------------------                  Tama High, MD Electronically Signed Final Report   10/01/2019 04:18 pm ----------------------------------------------------------------------   Assessment and Plan:  Pregnancy: [id number] at 37w0d1. Sickle cell trait (HCC) Routine hgb checks  2. Supervision of other normal pregnancy, antepartum 26-28 week labs next visit in 4 weeks  3. History of IUFD Growth scan at 355weeks per MFM, start baby aspirin, per guidelines ie African American race and hx of stillbirth - aspirin chewable tablet 81 mg  4. Alpha thalassemia silent carrier   Preterm labor symptoms and general obstetric precautions including but not limited to vaginal bleeding, contractions, leaking of fluid and fetal movement were reviewed in detail with the patient. I discussed the assessment and treatment plan with the patient. The patient was provided an opportunity to ask questions and all were answered. The patient agreed with the plan and demonstrated an understanding of the instructions. The patient was advised to call back or seek an in-person office evaluation[facility]. Please refer to After Visit Summary for other counseling recommendations.   I provided 10 minutes of face-to-face time during this encounter.  Return in about 4 weeks (around 11/07/2019) for ROB, in person, 2 hr GTT, 28 week labs.  Future Appointments  Date Time Provider [place] 10/10/2019  1:30 PM BGriffin Basil MD [facility] None  12/24/2019 12:30 PM [facility] 12/[facility]/2019  12:45 PM [facility] US[facility] [place]   LGriffin Basil MD [facility]

## 2019-10-10 NOTE — Progress Notes (Signed)
Pt is on the phone preparing for virtual visit with provider, [redacted]w[redacted]d. Pt reports that she has not picked up BP cuff yet, advised pt on where to go and she voices understanding. Pt denies any symptoms of swelling, HA, blurry vision, or sharp abdominal pain.

## 2019-10-21 ENCOUNTER — Emergency Department (HOSPITAL_COMMUNITY)
Admission: EM | Admit: 2019-10-21 | Discharge: 2019-10-21 | Disposition: A | Discharge status: Home / Self Care | Payer: Medicaid Other | Attending: Emergency Medicine | Admitting: Emergency Medicine

## 2019-10-21 ENCOUNTER — Other Ambulatory Visit: Payer: Self-pay

## 2019-10-21 ENCOUNTER — Encounter (HOSPITAL_COMMUNITY): Payer: Self-pay | Admitting: Emergency Medicine

## 2019-10-21 DIAGNOSIS — Z5321 Procedure and treatment not carried out due to patient leaving prior to being seen by health care provider: Secondary | ICD-10-CM | POA: Diagnosis not present

## 2019-10-21 DIAGNOSIS — R220 Localized swelling, mass and lump, head: Secondary | ICD-10-CM | POA: Insufficient documentation

## 2019-10-21 DIAGNOSIS — R21 Rash and other nonspecific skin eruption: Secondary | ICD-10-CM | POA: Insufficient documentation

## 2019-10-21 NOTE — ED Triage Notes (Signed)
Pt c/o rash to bilateral arms and torso, also c/o "knot" to her head and neck.

## 2019-10-21 NOTE — ED Notes (Signed)
Pt called for vitals x3 with no response.  

## 2019-10-21 NOTE — ED Notes (Signed)
Pt called for vitals x1 

## 2019-10-21 NOTE — ED Notes (Signed)
Pt called for vitals x2 

## 2019-11-07 ENCOUNTER — Ambulatory Visit (INDEPENDENT_AMBULATORY_CARE_PROVIDER_SITE_OTHER): Payer: Medicaid Other | Admitting: Women's Health

## 2019-11-07 ENCOUNTER — Other Ambulatory Visit: Payer: Medicaid Other

## 2019-11-07 ENCOUNTER — Other Ambulatory Visit: Payer: Self-pay

## 2019-11-07 VITALS — BP 112/71 | HR 85 | Wt 167.4 lb

## 2019-11-07 DIAGNOSIS — Z3480 Encounter for supervision of other normal pregnancy, unspecified trimester: Secondary | ICD-10-CM

## 2019-11-07 DIAGNOSIS — D563 Thalassemia minor: Secondary | ICD-10-CM

## 2019-11-07 DIAGNOSIS — Z23 Encounter for immunization: Secondary | ICD-10-CM

## 2019-11-07 DIAGNOSIS — Z8759 Personal history of other complications of pregnancy, childbirth and the puerperium: Secondary | ICD-10-CM

## 2019-11-07 DIAGNOSIS — D573 Sickle-cell trait: Secondary | ICD-10-CM

## 2019-11-07 NOTE — Progress Notes (Signed)
Subjective:  Evelyn [name] is a 29 y.o. [phone] at 103w0d being seen today for ongoing prenatal care.  She is currently monitored for the following issues for this low-risk pregnancy and has Sickle cell trait (HCC); Missed ab; Supervision of other normal pregnancy, antepartum; History of IUFD; and Alpha thalassemia silent carrier on their problem list.  Patient reports no complaints.  Contractions: Not present. Vag. Bleeding: None.  Movement: Present. Denies leaking of fluid.   The following portions of the patient's history were reviewed and updated as appropriate: allergies, current medications, past family history, past medical history, past social history, past surgical history and problem list. Problem list updated.  Objective:   Vitals:   11/07/19 0935  BP: 112/71  Pulse: 85  Weight: 167 lb 6.4 oz (75.9 kg)    Fetal Status: Fetal Heart Rate (bpm): 146   Movement: Present     General:  Alert, oriented and cooperative. Patient is in no acute distress.  Skin: Skin is warm and dry. No rash noted.   Cardiovascular: Normal heart rate noted  Respiratory: Normal respiratory effort, no problems with respiration noted  Abdomen: Soft, gravid, appropriate for gestational age. Pain/Pressure: Absent     Pelvic: Vag. Bleeding: None     Cervical exam deferred        Extremities: Normal range of motion.  Edema: None  Mental Status: Normal mood and affect. Normal behavior. Normal judgment and thought content.   Urinalysis:      Assessment and Plan:  Pregnancy: [id number] at [redacted]w[redacted]d  1. Supervision of other normal pregnancy, antepartum -Tdap today -CBE info given -ate a biscuit today, unable to complete glucose, will reschedule for lab only visit ASAP  2. Alpha thalassemia silent carrier -GC completed 08/07/2019  3. History of IUFD -term IUFD 2010  4. Sickle cell trait (HCC) - Urine Culture  Preterm labor symptoms and general obstetric precautions including but not limited to vaginal  bleeding, contractions, leaking of fluid and fetal movement were reviewed in detail with the patient. I discussed the assessment and treatment plan with the patient. The patient was provided an opportunity to ask questions and all were answered. The patient agreed with the plan and demonstrated an understanding of the instructions. The patient was advised to call back or seek an in-person office evaluation/go to [facility]U at [facility] for any urgent or concerning symptoms. Please refer to After Visit Summary for other counseling recommendations.  Return in about 4 weeks (around 12/05/2019) for in-person LOB/APP OK, needs lab visit for glucose/labs ASAP.   Jaielle Dlouhy, Odie Sera, NP

## 2019-11-07 NOTE — Patient Instructions (Addendum)
Maternity Assessment Unit (MAU)  The Maternity Assessment Unit (MAU) is located at the [facility] and [facility] at [facility]. The address is: [address] [place], [place], [place] [address]. Please see map below for additional directions.    The Maternity Assessment Unit is designed to help you during your pregnancy, and for up to 6 weeks after delivery, with any pregnancy- or postpartum-related emergencies, if you think you are in labor, or if your water has broken. For example, if you experience nausea and vomiting, vaginal bleeding, severe abdominal or pelvic pain, elevated blood pressure or other problems related to your pregnancy or postpartum time, please come to the Maternity Assessment Unit for assistance.       Childbirth Education Options: [facility] Department Classes:  Childbirth education classes can help you get ready for a positive parenting experience. You can also meet other expectant parents and get free stuff for your baby. Each class runs for five weeks on the same night and costs $45 for the mother-to-be and her support person. Medicaid covers the cost if you are eligible. Call [phone] to register. [facility] Childbirth Education:  [phone] or [phone] or [email or web address]  Baby & Me Class: Discuss newborn & infant parenting and family adjustment issues with other new mothers in a relaxed environment. Each week brings a new speaker or baby-centered activity. We encourage new mothers to join [place] every Thursday at 11:00am. Babies birth until crawling. No registration or fee. Daddy [employer]: This course offers Dads-to-be the tools and knowledge needed to feel confident on their journey to becoming new fathers. Experienced dads, who have been trained as coaches, teach dads-to-be how to hold, comfort, diaper, swaddle and play with their infant while being able to support the new mom as well. A class for men taught by  men. $25/dad Big Brother/Big Sister: Let your children share in the joy of a new brother or sister in this special class designed just for them. Class includes discussion about how families care for babies: swaddling, holding, diapering, safety as well as how they can be helpful in their new role. This class is designed for children ages 2 to 39, but any age is welcome. Please register each child individually. $5/child  Mom Talk: This mom-led group offers support and connection to mothers as they journey through the adjustments and struggles of that sometimes overwhelming first year after the birth of a child. Tuesdays at 10:00am and Thursdays at 6:00pm. Babies welcome. No registration or fee. Breastfeeding Support Group: This group is a mother-to-mother support circle where moms have the opportunity to share their breastfeeding experiences. A Lactation Consultant is present for questions and concerns. Meets each Tuesday at 11:00am. No fee or registration. Breastfeeding Your Baby: Learn what to expect in the first days of breastfeeding your newborn.  This class will help you feel more confident with the skills needed to begin your breastfeeding experience. Many new mothers are concerned about breastfeeding after leaving the hospital. This class will also address the most common fears and challenges about breastfeeding during the first few weeks, months and beyond. (call for fee) Comfort Techniques and Tour: This 2 hour interactive class will provide you the opportunity to learn & practice hands-on techniques that can help relieve some of the discomfort of labor and encourage your baby to rotate toward the best position for birth. You and your partner will be able to try a variety of labor positions with birth balls and rebozos as well  as practice breathing, relaxation, and visualization techniques. A tour of the [facility] is included with this class. $20 per registrant and support  person Childbirth Class- Weekend Option: This class is a Weekend version of our Birth & Baby series. It is designed for parents who have a difficult time fitting several weeks of classes into their schedule. It covers the care of your newborn and the basics of labor and childbirth. It also includes a Maternity Care Center Tour of [facility] and lunch. The class is held two consecutive days: beginning on Friday evening from 6:30 - 8:30 p.m. and the next day, Saturday from 9 a.m. - 4 p.m. (call for fee) Linden Dolin Class: Interested in a waterbirth?  This informational class will help you discover whether waterbirth is the right fit for you. Education about waterbirth itself, supplies you would need and how to assemble your support team is what you can expect from this class. Some obstetrical practices require this class in order to pursue a waterbirth. (Not all obstetrical practices offer waterbirth-check with your healthcare provider.) Register only the expectant mom, but you are encouraged to bring your partner to class! Required if planning waterbirth, no fee. Infant/Child CPR: Parents, grandparents, babysitters, and friends learn Cardio-Pulmonary Resuscitation skills for infants and children. You will also learn how to treat both conscious and unconscious choking in infants and children. This Family & Friends program does not offer certification. Register each participant individually to ensure that enough mannequins are available. (Call for fee) Grandparent Love: Expecting a grandbaby? This class is for you! Learn about the latest infant care and safety recommendations and ways to support your own child as he or she transitions into the parenting role. Taught by Registered Nurses who are childbirth instructors, but most importantly...they are grandmothers too! $10/person. Childbirth Class- Natural Childbirth: This series of 5 weekly classes is for expectant parents who want to learn and practice  natural methods of coping with the process of labor and childbirth. Relaxation, breathing, massage, visualization, role of the partner, and helpful positioning are highlighted. Participants learn how to be confident in their body's ability to give birth. This class will empower and help parents make informed decisions about their own care. Includes discussion that will help new parents transition into the immediate postpartum period. Maternity Care Center Tour of [facility] is included. We suggest taking this class between 25-32 weeks, but it's only a recommendation. $75 per registrant and one support person or $30 Medicaid. Childbirth Class- 3 week Series: This option of 3 weekly classes helps you and your labor partner prepare for childbirth. Newborn care, labor & birth, cesarean birth, pain management, and comfort techniques are discussed and a Maternity Care Center Tour of [facility] is included. The class meets at the same time, on the same day of the week for 3 consecutive weeks beginning with the starting date you choose. $60 for registrant and one support person.  Marvelous Multiples: Expecting twins, triplets, or more? This class covers the differences in labor, birth, parenting, and breastfeeding issues that face multiples' parents. NICU tour is included. Led by a Certified Childbirth Educator who is the mother of twins. No fee. Caring for Baby: This class is for expectant and adoptive parents who want to learn and practice the most up-to-date newborn care for their babies. Focus is on birth through the first six weeks of life. Topics include feeding, bathing, diapering, crying, umbilical cord care, circumcision care and safe sleep. Parents learn to  recognize symptoms of illness and when to call the pediatrician. Register only the mom-to-be and your partner or support person can plan to come with you! $10 per registrant and support person Childbirth Class- online option: This online class  offers you the freedom to complete a Birth and Baby series in the comfort of your own home. The flexibility of this option allows you to review sections at your own pace, at times convenient to you and your support people. It includes additional video information, animations, quizzes, and extended activities. Get organized with helpful eClass tools, checklists, and trackers. Once you register online for the class, you will receive an email within a few days to accept the invitation and begin the class when the time is right for you. The content will be available to you for 60 days. $60 for 60 days of online access for you and your support people.              https://www.cdc.gov/vaccines/hcp/vis/vis-statements/tdap.pdf">  Tdap (Tetanus, Diphtheria, Pertussis) Vaccine: What You Need to Know 1. Why get vaccinated? Tdap vaccine can prevent tetanus, diphtheria, and pertussis. Diphtheria and pertussis spread from person to person. Tetanus enters the body through cuts or wounds.  TETANUS (T) causes painful stiffening of the muscles. Tetanus can lead to serious health problems, including being unable to open the mouth, having trouble swallowing and breathing, or death.  DIPHTHERIA (D) can lead to difficulty breathing, heart failure, paralysis, or death.  PERTUSSIS (aP), also known as "whooping cough," can cause uncontrollable, violent coughing which makes it hard to breathe, eat, or drink. Pertussis can be extremely serious in babies and young children, causing pneumonia, convulsions, brain damage, or death. In teens and adults, it can cause weight loss, loss of bladder control, passing out, and rib fractures from severe coughing. 2. Tdap vaccine Tdap is only for children 7 years and older, adolescents, and adults.  Adolescents should receive a single dose of Tdap, preferably at age 6 or 12 years. Pregnant women should get a dose of Tdap during every pregnancy, to protect the newborn from  pertussis. Infants are most at risk for severe, life-threatening complications from pertussis. Adults who have never received Tdap should get a dose of Tdap. Also, adults should receive a booster dose every 10 years, or earlier in the case of a severe and dirty wound or burn. Booster doses can be either Tdap or Td (a different vaccine that protects against tetanus and diphtheria but not pertussis). Tdap may be given at the same time as other vaccines. 3. Talk with your health care provider Tell your vaccine provider if the person getting the vaccine:  Has had an allergic reaction after a previous dose of any vaccine that protects against tetanus, diphtheria, or pertussis, or has any severe, life-threatening allergies.  Has had a coma, decreased level of consciousness, or prolonged seizures within 7 days after a previous dose of any pertussis vaccine (DTP, DTaP, or Tdap).  Has seizures or another nervous system problem.  Has ever had Guillain-Barr Syndrome (also called GBS).  Has had severe pain or swelling after a previous dose of any vaccine that protects against tetanus or diphtheria. In some cases, your health care provider may decide to postpone Tdap vaccination to a future visit.  People with minor illnesses, such as a cold, may be vaccinated. People who are moderately or severely ill should usually wait until they recover before getting Tdap vaccine.  Your health care provider can give you more information. 4.  Risks of a vaccine reaction  Pain, redness, or swelling where the shot was given, mild fever, headache, feeling tired, and nausea, vomiting, diarrhea, or stomachache sometimes happen after Tdap vaccine. People sometimes faint after medical procedures, including vaccination. Tell your provider if you feel dizzy or have vision changes or ringing in the ears.  As with any medicine, there is a very remote chance of a vaccine causing a severe allergic reaction, other serious injury, or  death. 5. What if there is a serious problem? An allergic reaction could occur after the vaccinated person leaves the clinic. If you see signs of a severe allergic reaction (hives, swelling of the face and throat, difficulty breathing, a fast heartbeat, dizziness, or weakness), call 9-1-1 and get the person to the nearest hospital. For other signs that concern you, call your health care provider.  Adverse reactions should be reported to the Vaccine Adverse Event Reporting System (VAERS). Your health care provider will usually file this report, or you can do it yourself. Visit the VAERS website at www.vaers.LAgents.nohhs.gov or call 562-508-33111-201-210-1480. VAERS is only for reporting reactions, and VAERS staff do not give medical advice. 6. The National Vaccine Injury Compensation Program The [employer] Vaccine Injury Compensation Program (VICP) is a federal program that was created to compensate people who may have been injured by certain vaccines. Visit the VICP website at SpiritualWord.atwww.hrsa.gov/vaccinecompensation or call 906-263-50581-(959)636-9850 to learn about the program and about filing a claim. There is a time limit to file a claim for compensation. 7. How can I learn more?  Ask your health care provider.  Call your local or state health department.  Contact the Centers for Disease Control and Prevention (CDC): ? Call 519-761-92731-720-215-4817 (1-800-CDC-INFO) or ? Visit CDC's website at PicCapture.uywww.cdc.gov/vaccines Vaccine Information Statement Tdap (Tetanus, Diphtheria, Pertussis) Vaccine (06/07/2018) This information is not intended to replace advice given to you by your health care provider. Make sure you discuss any questions you have with your health care provider. Document Revised: 06/16/2018 Document Reviewed: 06/19/2018 Elsevier Patient Education  2020 Elsevier Inc.        Glucose Tolerance Test During Pregnancy Why am I having this test? The glucose tolerance test (GTT) is done to check how your body processes sugar (glucose).  This is one of several tests used to diagnose diabetes that develops during pregnancy (gestational diabetes mellitus). Gestational diabetes is a temporary form of diabetes that some women develop during pregnancy. It usually occurs during the second trimester of pregnancy and goes away after delivery. Testing (screening) for gestational diabetes usually occurs between 24 and 28 weeks of pregnancy. You may have the GTT test after having a 1-hour glucose screening test if the results from that test indicate that you may have gestational diabetes. You may also have this test if:  You have a history of gestational diabetes.  You have a history of giving birth to very large babies or have experienced repeated fetal loss (stillbirth).  You have signs and symptoms of diabetes, such as: ? Changes in your vision. ? Tingling or numbness in your hands or feet. ? Changes in hunger, thirst, and urination that are not otherwise explained by your pregnancy. What is being tested? This test measures the amount of glucose in your blood at different times during a period of 3 hours. This indicates how well your body is able to process glucose. What kind of sample is taken?  Blood samples are required for this test. They are usually collected by inserting a needle into  a blood vessel. How do I prepare for this test?  For 3 days before your test, eat normally. Have plenty of carbohydrate-rich foods.  Follow instructions from your health care provider about: ? Eating or drinking restrictions on the day of the test. You may be asked to not eat or drink anything other than water (fast) starting 8-10 hours before the test. ? Changing or stopping your regular medicines. Some medicines may interfere with this test. Tell a health care provider about:  All medicines you are taking, including vitamins, herbs, eye drops, creams, and over-the-counter medicines.  Any blood disorders you have.  Any surgeries you have  had.  Any medical conditions you have. What happens during the test? First, your blood glucose will be measured. This is referred to as your fasting blood glucose, since you fasted before the test. Then, you will drink a glucose solution that contains a certain amount of glucose. Your blood glucose will be measured again 1, 2, and 3 hours after drinking the solution. This test takes about 3 hours to complete. You will need to stay at the testing location during this time. During the testing period:  Do not eat or drink anything other than the glucose solution.  Do not exercise.  Do not use any products that contain nicotine or tobacco, such as cigarettes and e-cigarettes. If you need help stopping, ask your health care provider. The testing procedure may vary among health care providers and hospitals. How are the results reported? Your results will be reported as milligrams of glucose per deciliter of blood (mg/dL) or millimoles per liter (mmol/L). Your health care provider will compare your results to normal ranges that were established after testing a large group of people (reference ranges). Reference ranges may vary among labs and hospitals. For this test, common reference ranges are:  Fasting: less than 95-105 mg/dL (4.0-8.1 mmol/L).  1 hour after drinking glucose: less than 180-190 mg/dL (44.8-18.5 mmol/L).  2 hours after drinking glucose: less than 155-165 mg/dL (6.3-1.4 mmol/L).  3 hours after drinking glucose: 140-145 mg/dL (9.7-0.2 mmol/L). What do the results mean? Results within reference ranges are considered normal, meaning that your glucose levels are well-controlled. If two or more of your blood glucose levels are high, you may be diagnosed with gestational diabetes. If only one level is high, your health care provider may suggest repeat testing or other tests to confirm a diagnosis. Talk with your health care provider about what your results mean. Questions to ask your  health care provider Ask your health care provider, or the department that is doing the test:  When will my results be ready?  How will I get my results?  What are my treatment options?  What other tests do I need?  What are my next steps? Summary  The glucose tolerance test (GTT) is one of several tests used to diagnose diabetes that develops during pregnancy (gestational diabetes mellitus). Gestational diabetes is a temporary form of diabetes that some women develop during pregnancy.  You may have the GTT test after having a 1-hour glucose screening test if the results from that test indicate that you may have gestational diabetes. You may also have this test if you have any symptoms or risk factors for gestational diabetes.  Talk with your health care provider about what your results mean. This information is not intended to replace advice given to you by your health care provider. Make sure you discuss any questions you have with your health care  provider. Document Revised: 06/15/2018 Document Reviewed: 10/04/2016 Elsevier Patient Education  2020 [employer].        Preterm Labor and Birth Information  The normal length of a pregnancy is 39-41 weeks. Preterm labor is when labor starts before 37 completed weeks of pregnancy. What are the risk factors for preterm labor? Preterm labor is more likely to occur in women who:  Have certain infections during pregnancy such as a bladder infection, sexually transmitted infection, or infection inside the uterus (chorioamnionitis).  Have a shorter-than-normal cervix.  Have gone into preterm labor before.  Have had surgery on their cervix.  Are younger than age 19 or older than age 71.  Are African American.  Are pregnant with twins or multiple babies (multiple gestation).  Take street drugs or smoke while pregnant.  Do not gain enough weight while pregnant.  Became pregnant shortly after having been pregnant. What are  the symptoms of preterm labor? Symptoms of preterm labor include:  Cramps similar to those that can happen during a menstrual period. The cramps may happen with diarrhea.  Pain in the abdomen or lower back.  Regular uterine contractions that may feel like tightening of the abdomen.  A feeling of increased pressure in the pelvis.  Increased watery or bloody mucus discharge from the vagina.  Water breaking (ruptured amniotic sac). Why is it important to recognize signs of preterm labor? It is important to recognize signs of preterm labor because babies who are born prematurely may not be fully developed. This can put them at an increased risk for:  Long-term (chronic) heart and lung problems.  Difficulty immediately after birth with regulating body systems, including blood sugar, body temperature, heart rate, and breathing rate.  Bleeding in the brain.  Cerebral palsy.  Learning difficulties.  Death. These risks are highest for babies who are born before 34 weeks of pregnancy. How is preterm labor treated? Treatment depends on the length of your pregnancy, your condition, and the health of your baby. It may involve:  Having a stitch (suture) placed in your cervix to prevent your cervix from opening too early (cerclage).  Taking or being given medicines, such as: ? Hormone medicines. These may be given early in pregnancy to help support the pregnancy. ? Medicine to stop contractions. ? Medicines to help mature the baby's lungs. These may be prescribed if the risk of delivery is high. ? Medicines to prevent your baby from developing cerebral palsy. If the labor happens before 34 weeks of pregnancy, you may need to stay in the hospital. What should I do if I think I am in preterm labor? If you think that you are going into preterm labor, call your health care provider right away. How can I prevent preterm labor in future pregnancies? To increase your chance of having a full-term  pregnancy:  Do not use any tobacco products, such as cigarettes, chewing tobacco, and e-cigarettes. If you need help quitting, ask your health care provider.  Do not use street drugs or medicines that have not been prescribed to you during your pregnancy.  Talk with your health care provider before taking any herbal supplements, even if you have been taking them regularly.  Make sure you gain a healthy amount of weight during your pregnancy.  Watch for infection. If you think that you might have an infection, get it checked right away.  Make sure to tell your health care provider if you have gone into preterm labor before. This information is not  intended to replace advice given to you by your health care provider. Make sure you discuss any questions you have with your health care provider. Document Revised: 06/16/2018 Document Reviewed: 07/16/2015 Elsevier Patient Education  2020 [employer].

## 2019-11-07 NOTE — Addendum Note (Signed)
Addended by: Leola Brazil on: 11/07/2019 10:05 AM   Modules accepted: Orders

## 2019-11-12 LAB — URINE CULTURE

## 2019-11-14 ENCOUNTER — Other Ambulatory Visit: Payer: Medicaid Other

## 2019-11-14 ENCOUNTER — Encounter: Payer: Self-pay | Admitting: Women's Health

## 2019-11-14 ENCOUNTER — Other Ambulatory Visit: Payer: Self-pay

## 2019-11-14 DIAGNOSIS — Z3480 Encounter for supervision of other normal pregnancy, unspecified trimester: Secondary | ICD-10-CM

## 2019-11-14 DIAGNOSIS — O2340 Unspecified infection of urinary tract in pregnancy, unspecified trimester: Secondary | ICD-10-CM | POA: Insufficient documentation

## 2019-11-15 ENCOUNTER — Telehealth: Payer: Self-pay

## 2019-11-15 ENCOUNTER — Other Ambulatory Visit: Payer: Self-pay | Admitting: Women's Health

## 2019-11-15 DIAGNOSIS — O2342 Unspecified infection of urinary tract in pregnancy, second trimester: Secondary | ICD-10-CM

## 2019-11-15 LAB — HIV ANTIBODY (ROUTINE TESTING W REFLEX): HIV Screen 4th Generation wRfx: NONREACTIVE

## 2019-11-15 LAB — CBC
Hematocrit: 37.3 % (ref 34.0–46.6)
Hemoglobin: 11.7 g/dL (ref 11.1–15.9)
MCH: 26.1 pg — ABNORMAL LOW (ref 26.6–33.0)
MCHC: 31.4 g/dL — ABNORMAL LOW (ref 31.5–35.7)
MCV: 83 fL (ref 79–97)
Platelets: 265 10*3/uL (ref 150–450)
RBC: 4.48 x10E6/uL (ref 3.77–5.28)
RDW: 13.4 % (ref 11.7–15.4)
WBC: 9 10*3/uL (ref 3.4–10.8)

## 2019-11-15 LAB — GLUCOSE TOLERANCE, 2 HOURS W/ 1HR
Glucose, 1 hour: 140 mg/dL (ref 65–179)
Glucose, 2 hour: 106 mg/dL (ref 65–152)
Glucose, Fasting: 73 mg/dL (ref 65–91)

## 2019-11-15 LAB — RPR: RPR Ser Ql: NONREACTIVE

## 2019-11-15 MED ORDER — CEFADROXIL 500 MG PO CAPS: 500.0000 mg | ORAL_CAPSULE | Freq: Two times a day (BID) | ORAL | 0 refills | Status: AC

## 2019-11-15 NOTE — Telephone Encounter (Signed)
TC to pt to make aware of UTI and Rx has been sent Pt not ava . LVMOM

## 2019-11-15 NOTE — Telephone Encounter (Signed)
-----   Message from Marylen Ponto, NP sent at 11/15/2019 10:53 AM EDT ----- Thank you so much for letting me know! We have protocols for everything, so I just assumed. :)  I have sent some antibiotics to her pharmacy for her. Could you just call her and let her know since she does not have MyChart?  Thank you! Joni Reining ----- Message ----- From: Kennon Portela, CMA Sent: 11/15/2019   8:55 AM EDT To: Marylen Ponto, NP   Good Morning Joni Reining, we do not have a protocol to treat UTI's. Only when we work them up as a nurse visit we dip the urine and send it for cultures. Macrobid 100mg  /Pyridium 200mg  is the only Rx that can be sent doing that nurse visit work up if the pt wants and we still await the actually culture and those results are still reviewed and signed off by a provider etc if that makes sense.   If the provider saw the pt and results come back they send the Rx etc. So I have no idea what to send just being honest with you.   Let me know what you want to sent to treat pt UTI.   CMA  ----- Message ----- From: , NP Sent: 11/14/2019  11:29 AM EDT To: Marylen Ponto [facility]  Good morning - this patient tested positive for a urinary tract infection. Please call to notify and send treatment per protocol, noting that this culture is resistant to some antibiotics. Patient does not have MyChart. Thank you, 01/14/2020

## 2019-11-15 NOTE — Progress Notes (Signed)
RX cefadroxil for UTI.  Marylen Ponto, NP  10:44 AM 11/15/2019

## 2019-11-15 NOTE — Telephone Encounter (Signed)
Pt made aware of results of UTI and that treatment Rx was sent  Pt voiced understanding.

## 2019-12-05 ENCOUNTER — Ambulatory Visit (INDEPENDENT_AMBULATORY_CARE_PROVIDER_SITE_OTHER): Payer: Medicaid Other | Admitting: Obstetrics and Gynecology

## 2019-12-05 ENCOUNTER — Other Ambulatory Visit: Payer: Self-pay

## 2019-12-05 VITALS — BP 125/75 | Wt 172.6 lb

## 2019-12-05 DIAGNOSIS — Z3480 Encounter for supervision of other normal pregnancy, unspecified trimester: Secondary | ICD-10-CM

## 2019-12-05 DIAGNOSIS — D573 Sickle-cell trait: Secondary | ICD-10-CM

## 2019-12-05 MED ORDER — FAMOTIDINE 20 MG PO TABS
20.0000 mg | ORAL_TABLET | Freq: Two times a day (BID) | ORAL | 1 refills | Status: DC
Start: 2019-12-05 — End: 2020-02-28

## 2019-12-05 NOTE — Progress Notes (Signed)
125/75 

## 2019-12-05 NOTE — Progress Notes (Signed)
   PRENATAL VISIT NOTE  Subjective:  Evelyn [name] is a 29 y.o. [phone] at [redacted]w[redacted]d being seen today for ongoing prenatal care.  She is currently monitored for the following issues for this low-risk pregnancy and has Sickle cell trait (HCC); Missed ab; Supervision of other normal pregnancy, antepartum; History of IUFD; Alpha thalassemia silent carrier; and UTI in pregnancy on their problem list.  Patient reports no complaints.  Contractions: Not present. Vag. Bleeding: None.  Movement: Present. Denies leaking of fluid.   The following portions of the patient's history were reviewed and updated as appropriate: allergies, current medications, past family history, past medical history, past social history, past surgical history and problem list.   Objective:   Vitals:   12/05/19 1539  BP: 125/75  Weight: 172 lb 9.6 oz (78.3 kg)    Fetal Status: Fetal Heart Rate (bpm): 133 Fundal Height: 31 cm Movement: Present     General:  Alert, oriented and cooperative. Patient is in no acute distress.  Skin: Skin is warm and dry. No rash noted.   Cardiovascular: Normal heart rate noted  Respiratory: Normal respiratory effort, no problems with respiration noted  Abdomen: Soft, gravid, appropriate for gestational age.  Pain/Pressure: Absent     Pelvic: Cervical exam deferred        Extremities: Normal range of motion.  Edema: None  Mental Status: Normal mood and affect. Normal behavior. Normal judgment and thought content.   Assessment and Plan:  Pregnancy: [id number] at [redacted]w[redacted]d 1. Sickle cell trait (HCC)  - Culture, OB Urine- patient left prior to collection.  - Doing well  - Normal 2 hour GTT  - [place] scheduled at 32 weeks for hx of IUFD   Preterm labor symptoms and general obstetric precautions including but not limited to vaginal bleeding, contractions, leaking of fluid and fetal movement were reviewed in detail with the patient. Please refer to After Visit Summary for other counseling recommendations.     No follow-ups on file.  Future Appointments  Date Time Provider Department Center  12/19/2019  8:55 AM Norah Devin, Harolyn Rutherford, NP [facility] None  12/24/2019 12:30 PM [facility] NURSE [facility]  12/24/2019 12:45 PM [facility] US5 [facility]    Venia Carbon, NP

## 2019-12-19 ENCOUNTER — Encounter: Payer: Medicaid Other | Admitting: Obstetrics and Gynecology

## 2019-12-19 DIAGNOSIS — Z3A32 32 weeks gestation of pregnancy: Secondary | ICD-10-CM

## 2019-12-19 DIAGNOSIS — Z3480 Encounter for supervision of other normal pregnancy, unspecified trimester: Secondary | ICD-10-CM

## 2019-12-24 ENCOUNTER — Ambulatory Visit: Payer: Medicaid Other | Admitting: *Deleted

## 2019-12-24 ENCOUNTER — Other Ambulatory Visit: Payer: Self-pay | Admitting: *Deleted

## 2019-12-24 ENCOUNTER — Ambulatory Visit: Payer: Medicaid Other | Attending: Obstetrics and Gynecology

## 2019-12-24 ENCOUNTER — Other Ambulatory Visit: Payer: Self-pay | Admitting: Obstetrics and Gynecology

## 2019-12-24 ENCOUNTER — Other Ambulatory Visit: Payer: Self-pay

## 2019-12-24 DIAGNOSIS — O09293 Supervision of pregnancy with other poor reproductive or obstetric history, third trimester: Secondary | ICD-10-CM | POA: Diagnosis not present

## 2019-12-24 DIAGNOSIS — Z862 Personal history of diseases of the blood and blood-forming organs and certain disorders involving the immune mechanism: Secondary | ICD-10-CM | POA: Diagnosis not present

## 2019-12-24 DIAGNOSIS — Z8759 Personal history of other complications of pregnancy, childbirth and the puerperium: Secondary | ICD-10-CM

## 2019-12-24 DIAGNOSIS — Z3A32 32 weeks gestation of pregnancy: Secondary | ICD-10-CM

## 2020-01-02 ENCOUNTER — Ambulatory Visit: Payer: Medicaid Other | Admitting: *Deleted

## 2020-01-02 ENCOUNTER — Ambulatory Visit: Payer: Medicaid Other | Attending: Obstetrics and Gynecology

## 2020-01-02 ENCOUNTER — Other Ambulatory Visit: Payer: Self-pay

## 2020-01-02 ENCOUNTER — Encounter: Payer: Self-pay | Admitting: *Deleted

## 2020-01-02 VITALS — BP 123/74 | HR 91

## 2020-01-02 DIAGNOSIS — Z8759 Personal history of other complications of pregnancy, childbirth and the puerperium: Secondary | ICD-10-CM | POA: Diagnosis present

## 2020-01-02 DIAGNOSIS — Z862 Personal history of diseases of the blood and blood-forming organs and certain disorders involving the immune mechanism: Secondary | ICD-10-CM

## 2020-01-02 DIAGNOSIS — O09293 Supervision of pregnancy with other poor reproductive or obstetric history, third trimester: Secondary | ICD-10-CM | POA: Diagnosis not present

## 2020-01-02 DIAGNOSIS — Z3A34 34 weeks gestation of pregnancy: Secondary | ICD-10-CM | POA: Diagnosis not present

## 2020-01-07 ENCOUNTER — Ambulatory Visit: Payer: Medicaid Other | Attending: Obstetrics and Gynecology

## 2020-01-07 ENCOUNTER — Ambulatory Visit: Payer: Medicaid Other

## 2020-01-14 ENCOUNTER — Ambulatory Visit: Payer: Medicaid Other | Attending: Obstetrics and Gynecology

## 2020-01-14 ENCOUNTER — Encounter: Payer: Self-pay | Admitting: *Deleted

## 2020-01-14 ENCOUNTER — Ambulatory Visit: Payer: Medicaid Other | Admitting: *Deleted

## 2020-01-14 ENCOUNTER — Other Ambulatory Visit: Payer: Self-pay

## 2020-01-14 VITALS — BP 118/71 | HR 94

## 2020-01-14 DIAGNOSIS — Z8759 Personal history of other complications of pregnancy, childbirth and the puerperium: Secondary | ICD-10-CM

## 2020-01-14 DIAGNOSIS — Z3A35 35 weeks gestation of pregnancy: Secondary | ICD-10-CM | POA: Diagnosis not present

## 2020-01-14 DIAGNOSIS — O09293 Supervision of pregnancy with other poor reproductive or obstetric history, third trimester: Secondary | ICD-10-CM | POA: Diagnosis not present

## 2020-01-14 DIAGNOSIS — Z862 Personal history of diseases of the blood and blood-forming organs and certain disorders involving the immune mechanism: Secondary | ICD-10-CM | POA: Diagnosis not present

## 2020-01-15 ENCOUNTER — Other Ambulatory Visit: Payer: Self-pay | Admitting: *Deleted

## 2020-01-15 DIAGNOSIS — Z8759 Personal history of other complications of pregnancy, childbirth and the puerperium: Secondary | ICD-10-CM

## 2020-01-21 ENCOUNTER — Ambulatory Visit: Payer: Medicaid Other | Admitting: *Deleted

## 2020-01-21 ENCOUNTER — Encounter: Payer: Self-pay | Admitting: *Deleted

## 2020-01-21 ENCOUNTER — Other Ambulatory Visit: Payer: Self-pay

## 2020-01-21 ENCOUNTER — Ambulatory Visit: Payer: Medicaid Other | Attending: Obstetrics and Gynecology

## 2020-01-21 VITALS — BP 121/71 | HR 88

## 2020-01-21 DIAGNOSIS — Z148 Genetic carrier of other disease: Secondary | ICD-10-CM | POA: Diagnosis not present

## 2020-01-21 DIAGNOSIS — D573 Sickle-cell trait: Secondary | ICD-10-CM | POA: Diagnosis present

## 2020-01-21 DIAGNOSIS — O43893 Other placental disorders, third trimester: Secondary | ICD-10-CM | POA: Diagnosis not present

## 2020-01-21 DIAGNOSIS — Z8759 Personal history of other complications of pregnancy, childbirth and the puerperium: Secondary | ICD-10-CM

## 2020-01-21 DIAGNOSIS — O403XX0 Polyhydramnios, third trimester, not applicable or unspecified: Secondary | ICD-10-CM | POA: Diagnosis not present

## 2020-01-21 DIAGNOSIS — O09293 Supervision of pregnancy with other poor reproductive or obstetric history, third trimester: Secondary | ICD-10-CM

## 2020-01-21 DIAGNOSIS — Z862 Personal history of diseases of the blood and blood-forming organs and certain disorders involving the immune mechanism: Secondary | ICD-10-CM

## 2020-01-21 DIAGNOSIS — Z3A36 36 weeks gestation of pregnancy: Secondary | ICD-10-CM

## 2020-01-21 NOTE — Progress Notes (Signed)
C/O" greenish mucous discharge."

## 2020-01-22 ENCOUNTER — Other Ambulatory Visit: Payer: Self-pay | Admitting: Women's Health

## 2020-01-22 ENCOUNTER — Ambulatory Visit (INDEPENDENT_AMBULATORY_CARE_PROVIDER_SITE_OTHER): Payer: Medicaid Other | Admitting: Obstetrics and Gynecology

## 2020-01-22 ENCOUNTER — Encounter: Payer: Medicaid Other | Admitting: Obstetrics and Gynecology

## 2020-01-22 ENCOUNTER — Other Ambulatory Visit (HOSPITAL_COMMUNITY)
Admission: RE | Admit: 2020-01-22 | Discharge: 2020-01-22 | Disposition: A | Discharge status: Home / Self Care | Payer: Medicaid Other | Source: Ambulatory Visit | Attending: Obstetrics and Gynecology | Admitting: Obstetrics and Gynecology

## 2020-01-22 VITALS — BP 119/72 | HR 90 | Wt 187.0 lb

## 2020-01-22 DIAGNOSIS — Z8759 Personal history of other complications of pregnancy, childbirth and the puerperium: Secondary | ICD-10-CM

## 2020-01-22 DIAGNOSIS — Z3A36 36 weeks gestation of pregnancy: Secondary | ICD-10-CM | POA: Diagnosis not present

## 2020-01-22 DIAGNOSIS — O23599 Infection of other part of genital tract in pregnancy, unspecified trimester: Secondary | ICD-10-CM | POA: Insufficient documentation

## 2020-01-22 DIAGNOSIS — Z3480 Encounter for supervision of other normal pregnancy, unspecified trimester: Secondary | ICD-10-CM | POA: Diagnosis present

## 2020-01-22 DIAGNOSIS — D573 Sickle-cell trait: Secondary | ICD-10-CM

## 2020-01-22 DIAGNOSIS — N898 Other specified noninflammatory disorders of vagina: Secondary | ICD-10-CM

## 2020-01-22 DIAGNOSIS — D563 Thalassemia minor: Secondary | ICD-10-CM

## 2020-01-22 DIAGNOSIS — O403XX0 Polyhydramnios, third trimester, not applicable or unspecified: Secondary | ICD-10-CM

## 2020-01-22 NOTE — Progress Notes (Signed)
   PRENATAL VISIT NOTE  Subjective:  Evelyn [name] is a 29 y.o. [phone] at [redacted]w[redacted]d being seen today for ongoing prenatal care.  She is currently monitored for the following issues for this high-risk pregnancy and has Sickle cell trait (HCC); Missed ab; Supervision of other normal pregnancy, antepartum; History of IUFD; Alpha thalassemia silent carrier; and UTI in pregnancy on their problem list.  Patient reports no complaints.  Contractions: Irregular. Vag. Bleeding: Scant.  Movement: Present. Denies leaking of fluid.   The following portions of the patient's history were reviewed and updated as appropriate: allergies, current medications, past family history, past medical history, past social history, past surgical history and problem list.   Objective:   Vitals:   01/22/20 1332  BP: 119/72  Pulse: 90  Weight: 187 lb (84.8 kg)    Fetal Status: Fetal Heart Rate (bpm): 137   Movement: Present     General:  Alert, oriented and cooperative. Patient is in no acute distress.  Skin: Skin is warm and dry. No rash noted.   Cardiovascular: Normal heart rate noted  Respiratory: Normal respiratory effort, no problems with respiration noted  Abdomen: Soft, gravid, appropriate for gestational age.  Pain/Pressure: Absent     Pelvic: Cervical exam deferred        Extremities: Normal range of motion.  Edema: None  Mental Status: Normal mood and affect. Normal behavior. Normal judgment and thought content.   Assessment and Plan:  Pregnancy: [id number] at [redacted]w[redacted]d 1. [redacted] weeks gestation of pregnancy  - Culture, beta strep (group b only) - Cervicovaginal ancillary only( [place])  2. Supervision of other normal pregnancy, antepartum  - Culture, beta strep (group b only) - Cervicovaginal ancillary only( [place])  3. Vaginal discharge  - Cervicovaginal ancillary only( [place])  4. History of IUFD - BPP yesterday 8/8. - Per MFM, induction at 37-38 weeks.  Will schedule for induction this  week.  5. Alpha thalassemia silent carrier   6. Sickle cell trait (HCC)   7. Polyhydramnios in third trimester complication, single or unspecified fetus - Will schedule for induction this week.    Preterm labor symptoms and general obstetric precautions including but not limited to vaginal bleeding, contractions, leaking of fluid and fetal movement were reviewed in detail with the patient. Please refer to After Visit Summary for other counseling recommendations.   No follow-ups on file.  Future Appointments  Date Time Provider Department Center  01/28/2020  3:45 PM [facility] NURSE [facility]  01/28/2020  4:00 PM [facility] US1 [facility]  02/04/2020  3:15 PM [facility] NURSE [facility]  02/04/2020  3:30 PM [facility] US3 [facility]    Johnny Bridge, MD

## 2020-01-23 ENCOUNTER — Other Ambulatory Visit (HOSPITAL_COMMUNITY)
Admission: RE | Admit: 2020-01-23 | Discharge: 2020-01-23 | Disposition: A | Discharge status: Home / Self Care | Payer: Medicaid Other | Source: Ambulatory Visit | Attending: Family Medicine | Admitting: Family Medicine

## 2020-01-23 ENCOUNTER — Other Ambulatory Visit: Payer: Self-pay | Admitting: Advanced Practice Midwife

## 2020-01-23 ENCOUNTER — Telehealth (HOSPITAL_COMMUNITY): Payer: Self-pay | Admitting: *Deleted

## 2020-01-23 DIAGNOSIS — Z20822 Contact with and (suspected) exposure to covid-19: Secondary | ICD-10-CM | POA: Insufficient documentation

## 2020-01-23 DIAGNOSIS — Z01812 Encounter for preprocedural laboratory examination: Secondary | ICD-10-CM | POA: Insufficient documentation

## 2020-01-23 LAB — CERVICOVAGINAL ANCILLARY ONLY
Bacterial Vaginitis (gardnerella): NEGATIVE
Candida Glabrata: NEGATIVE
Candida Vaginitis: POSITIVE — AB
Chlamydia: NEGATIVE
Comment: NEGATIVE
Comment: NEGATIVE
Comment: NEGATIVE
Comment: NEGATIVE
Comment: NORMAL
Neisseria Gonorrhea: NEGATIVE

## 2020-01-23 LAB — SARS CORONAVIRUS 2 (TAT 6-24 HRS): SARS Coronavirus 2: NEGATIVE

## 2020-01-23 NOTE — Telephone Encounter (Signed)
Preadmission screen  

## 2020-01-24 ENCOUNTER — Inpatient Hospital Stay (HOSPITAL_COMMUNITY): Payer: Medicaid Other

## 2020-01-24 ENCOUNTER — Encounter (HOSPITAL_COMMUNITY): Payer: Self-pay | Admitting: Obstetrics and Gynecology

## 2020-01-24 ENCOUNTER — Inpatient Hospital Stay (HOSPITAL_COMMUNITY): Payer: Medicaid Other | Admitting: Anesthesiology

## 2020-01-24 ENCOUNTER — Inpatient Hospital Stay (HOSPITAL_COMMUNITY)
Admission: AD | Admit: 2020-01-24 | Discharge: 2020-01-27 | DRG: 807 | Disposition: A | Discharge status: Home / Self Care | Payer: Medicaid Other | Attending: Obstetrics and Gynecology | Admitting: Obstetrics and Gynecology

## 2020-01-24 ENCOUNTER — Other Ambulatory Visit: Payer: Self-pay

## 2020-01-24 ENCOUNTER — Other Ambulatory Visit: Payer: Self-pay | Admitting: Obstetrics & Gynecology

## 2020-01-24 ENCOUNTER — Inpatient Hospital Stay (HOSPITAL_COMMUNITY)
Admission: AD | Admit: 2020-01-24 | Payer: Medicaid Other | Source: Home / Self Care | Admitting: Obstetrics & Gynecology

## 2020-01-24 DIAGNOSIS — Z8759 Personal history of other complications of pregnancy, childbirth and the puerperium: Secondary | ICD-10-CM

## 2020-01-24 DIAGNOSIS — D573 Sickle-cell trait: Secondary | ICD-10-CM | POA: Diagnosis present

## 2020-01-24 DIAGNOSIS — O403XX0 Polyhydramnios, third trimester, not applicable or unspecified: Secondary | ICD-10-CM | POA: Diagnosis present

## 2020-01-24 DIAGNOSIS — Z30430 Encounter for insertion of intrauterine contraceptive device: Secondary | ICD-10-CM | POA: Diagnosis not present

## 2020-01-24 DIAGNOSIS — Z20822 Contact with and (suspected) exposure to covid-19: Secondary | ICD-10-CM | POA: Diagnosis present

## 2020-01-24 DIAGNOSIS — O9902 Anemia complicating childbirth: Secondary | ICD-10-CM | POA: Diagnosis present

## 2020-01-24 DIAGNOSIS — Z87891 Personal history of nicotine dependence: Secondary | ICD-10-CM

## 2020-01-24 DIAGNOSIS — O99013 Anemia complicating pregnancy, third trimester: Secondary | ICD-10-CM

## 2020-01-24 DIAGNOSIS — B373 Candidiasis of vulva and vagina: Secondary | ICD-10-CM

## 2020-01-24 DIAGNOSIS — Z3A37 37 weeks gestation of pregnancy: Secondary | ICD-10-CM

## 2020-01-24 DIAGNOSIS — O409XX0 Polyhydramnios, unspecified trimester, not applicable or unspecified: Principal | ICD-10-CM | POA: Diagnosis present

## 2020-01-24 DIAGNOSIS — B3731 Acute candidiasis of vulva and vagina: Secondary | ICD-10-CM

## 2020-01-24 DIAGNOSIS — Z3480 Encounter for supervision of other normal pregnancy, unspecified trimester: Secondary | ICD-10-CM

## 2020-01-24 DIAGNOSIS — D563 Thalassemia minor: Secondary | ICD-10-CM | POA: Diagnosis present

## 2020-01-24 LAB — CBC
HCT: 35.3 % — ABNORMAL LOW (ref 36.0–46.0)
Hemoglobin: 11.6 g/dL — ABNORMAL LOW (ref 12.0–15.0)
MCH: 26.1 pg (ref 26.0–34.0)
MCHC: 32.9 g/dL (ref 30.0–36.0)
MCV: 79.5 fL — ABNORMAL LOW (ref 80.0–100.0)
Platelets: 282 10*3/uL (ref 150–400)
RBC: 4.44 MIL/uL (ref 3.87–5.11)
RDW: 13 % (ref 11.5–15.5)
WBC: 7.7 10*3/uL (ref 4.0–10.5)
nRBC: 0 % (ref 0.0–0.2)

## 2020-01-24 LAB — TYPE AND SCREEN
ABO/RH(D): B POS
Antibody Screen: NEGATIVE

## 2020-01-24 MED ORDER — ONDANSETRON HCL 4 MG/2ML IJ SOLN: 4.0000 mg | Freq: Four times a day (QID) | INTRAMUSCULAR | Status: DC | PRN

## 2020-01-24 MED ORDER — LEVONORGESTREL 19.5 MCG/DAY IU IUD
INTRAUTERINE_SYSTEM | Freq: Once | INTRAUTERINE | Status: AC
  Administered 2020-01-25: 1 via INTRAUTERINE
  Filled 2020-01-24: qty 1

## 2020-01-24 MED ORDER — TERCONAZOLE 0.8 % VA CREA: 1.0000 | TOPICAL_CREAM | Freq: Every day | VAGINAL | 0 refills | Status: DC

## 2020-01-24 MED ORDER — DIPHENHYDRAMINE HCL 50 MG/ML IJ SOLN: 12.5000 mg | INTRAMUSCULAR | Status: DC | PRN

## 2020-01-24 MED ORDER — LACTATED RINGERS IV SOLN
500.0000 mL | Freq: Once | INTRAVENOUS | Status: AC
  Administered 2020-01-24: 500 mL via INTRAVENOUS

## 2020-01-24 MED ORDER — OXYCODONE-ACETAMINOPHEN 5-325 MG PO TABS: 1.0000 | ORAL_TABLET | ORAL | Status: DC | PRN

## 2020-01-24 MED ORDER — TERBUTALINE SULFATE 1 MG/ML IJ SOLN: 0.2500 mg | Freq: Once | INTRAMUSCULAR | Status: DC | PRN

## 2020-01-24 MED ORDER — MISOPROSTOL 25 MCG QUARTER TABLET: 25.0000 ug | ORAL_TABLET | ORAL | Status: DC | PRN

## 2020-01-24 MED ORDER — EPHEDRINE 5 MG/ML INJ: 10.0000 mg | INTRAVENOUS | Status: DC | PRN

## 2020-01-24 MED ORDER — OXYTOCIN BOLUS FROM INFUSION
333.0000 mL | Freq: Once | INTRAVENOUS | Status: AC
  Administered 2020-01-25: 333 mL via INTRAVENOUS

## 2020-01-24 MED ORDER — PHENYLEPHRINE 40 MCG/ML (10ML) SYRINGE FOR IV PUSH (FOR BLOOD PRESSURE SUPPORT): 80.0000 ug | PREFILLED_SYRINGE | INTRAVENOUS | Status: DC | PRN

## 2020-01-24 MED ORDER — HYDROXYZINE HCL 50 MG PO TABS: 50.0000 mg | ORAL_TABLET | Freq: Four times a day (QID) | ORAL | Status: DC | PRN

## 2020-01-24 MED ORDER — SOD CITRATE-CITRIC ACID 500-334 MG/5ML PO SOLN: 30.0000 mL | ORAL | Status: DC | PRN

## 2020-01-24 MED ORDER — LACTATED RINGERS IV SOLN: INTRAVENOUS | Status: DC

## 2020-01-24 MED ORDER — OXYTOCIN-SODIUM CHLORIDE 30-0.9 UT/500ML-% IV SOLN: 2.5000 [IU]/h | INTRAVENOUS | Status: DC

## 2020-01-24 MED ORDER — ACETAMINOPHEN 325 MG PO TABS: 650.0000 mg | ORAL_TABLET | ORAL | Status: DC | PRN

## 2020-01-24 MED ORDER — FENTANYL-BUPIVACAINE-NACL 0.5-0.125-0.9 MG/250ML-% EP SOLN
12.0000 mL/h | EPIDURAL | Status: DC | PRN
  Filled 2020-01-24: qty 250

## 2020-01-24 MED ORDER — SODIUM CHLORIDE (PF) 0.9 % IJ SOLN
INTRAMUSCULAR | Status: DC | PRN
  Administered 2020-01-24: 12 mL/h via EPIDURAL

## 2020-01-24 MED ORDER — LIDOCAINE HCL (PF) 1 % IJ SOLN: 30.0000 mL | INTRAMUSCULAR | Status: DC | PRN

## 2020-01-24 MED ORDER — FENTANYL CITRATE (PF) 100 MCG/2ML IJ SOLN: 100.0000 ug | INTRAMUSCULAR | Status: DC | PRN

## 2020-01-24 MED ORDER — LACTATED RINGERS IV SOLN
500.0000 mL | INTRAVENOUS | Status: DC | PRN
  Administered 2020-01-25: 500 mL via INTRAVENOUS

## 2020-01-24 MED ORDER — OXYCODONE-ACETAMINOPHEN 5-325 MG PO TABS: 2.0000 | ORAL_TABLET | ORAL | Status: DC | PRN

## 2020-01-24 MED ORDER — LIDOCAINE HCL (PF) 1 % IJ SOLN
INTRAMUSCULAR | Status: DC | PRN
  Administered 2020-01-24: 10 mL via EPIDURAL
  Administered 2020-01-24: 2 mL via EPIDURAL

## 2020-01-24 MED ORDER — MISOPROSTOL 50MCG HALF TABLET
50.0000 ug | ORAL_TABLET | ORAL | Status: DC
  Administered 2020-01-24: 50 ug via ORAL
  Filled 2020-01-24: qty 1

## 2020-01-24 MED ORDER — FLEET ENEMA 7-19 GM/118ML RE ENEM: 1.0000 | ENEMA | RECTAL | Status: DC | PRN

## 2020-01-24 MED ORDER — OXYTOCIN-SODIUM CHLORIDE 30-0.9 UT/500ML-% IV SOLN
1.0000 m[IU]/min | INTRAVENOUS | Status: DC
  Administered 2020-01-24: 2 m[IU]/min via INTRAVENOUS
  Filled 2020-01-24: qty 500

## 2020-01-24 NOTE — Anesthesia Preprocedure Evaluation (Signed)
Anesthesia Evaluation  Patient identified by MRN, date of birth, ID band Patient awake    Reviewed: Allergy & Precautions, Patient's Chart, lab work & pertinent test results  Airway Mallampati: II  TM Distance: >3 FB Neck ROM: Full    Dental no notable dental hx.    Pulmonary neg pulmonary ROS, former smoker,    Pulmonary exam normal breath sounds clear to auscultation       Cardiovascular negative cardio ROS Normal cardiovascular exam Rhythm:Regular Rate:Normal     Neuro/Psych negative neurological ROS     GI/Hepatic Neg liver ROS, GERD  Medicated and Controlled,  Endo/Other  negative endocrine ROS  Renal/GU negative Renal ROS     Musculoskeletal negative musculoskeletal ROS (+)   Abdominal   Peds  Hematology  (+) Blood dyscrasia, , Sickle cell trait   Anesthesia Other Findings   Reproductive/Obstetrics (+) Pregnancy Grand multip G8P4                             Anesthesia Physical Anesthesia Plan  ASA: III and emergent  Anesthesia Plan: Epidural   Post-op Pain Management:    Induction:   PONV Risk Score and Plan: 2  Airway Management Planned: Natural Airway  Additional Equipment: None  Intra-op Plan:   Post-operative Plan:   Informed Consent: I have reviewed the patients History and Physical, chart, labs and discussed the procedure including the risks, benefits and alternatives for the proposed anesthesia with the patient or authorized representative who has indicated his/her understanding and acceptance.       Plan Discussed with:   Anesthesia Plan Comments:         Anesthesia Quick Evaluation

## 2020-01-24 NOTE — Progress Notes (Addendum)
S: Doing well, pain 7/10 during contractions. Epidural desires. Denies pelvic pressure. Tolerated SVE well.   O: Vitals:   01/24/20 1426 01/24/20 1506 01/24/20 1838 01/24/20 1939  BP: 126/77  104/66 127/73  Pulse: 92  72 70  Resp: 18  18 18   Temp: 98.1 F (36.7 C)  97.7 F (36.5 C) 98.7 F (37.1 C)  TempSrc: Oral  Oral Oral  Weight:  83.5 kg    Height:  5\' 7"  (1.702 m)       FHT:  FHR: 125 bpm, variability: moderate,  accelerations:  Present,  decelerations:  Absent UC:   irregular, every 1-6 minutes SVE:   Dilation: 3.5 Effacement (%): 70 Station: -2 Exam by:: , SNM   A / P: IOL for hx of IUFD at 38 weeks. Starting low dose pitocin 2x2 at this time.  Fetal Wellbeing:  Category I Pain Control:  Labor support without medications  Anticipated MOD:  NSVD  [phone] 01/24/2020, 8:43 PM  Sande Rives

## 2020-01-24 NOTE — Anesthesia Procedure Notes (Signed)
Epidural Patient location during procedure: OB Start time: 01/24/2020 11:06 PM End time: 01/24/2020 11:17 PM  Staffing Anesthesiologist: Lannie Fields, DO Performed: anesthesiologist   Preanesthetic Checklist Completed: patient identified, IV checked, risks and benefits discussed, monitors and equipment checked, pre-op evaluation and timeout performed  Epidural Patient position: sitting Prep: DuraPrep and site prepped and draped Patient monitoring: continuous pulse ox, blood pressure, heart rate and cardiac monitor Approach: midline Location: L3-L4 Injection technique: LOR air  Needle:  Needle type: Tuohy  Needle gauge: 17 G Needle length: 9 cm Needle insertion depth: 8 cm Catheter type: closed end flexible Catheter size: 19 Gauge Catheter at skin depth: 13 cm Test dose: negative  Assessment Sensory level: T8 Events: blood not aspirated, injection not painful, no injection resistance, no paresthesia and negative IV test  Additional Notes Patient identified. Risks/Benefits/Options discussed with patient including but not limited to bleeding, infection, nerve damage, paralysis, failed block, incomplete pain control, headache, blood pressure changes, nausea, vomiting, reactions to medication both or allergic, itching and postpartum back pain. Confirmed with bedside nurse the patient's most recent platelet count. Confirmed with patient that they are not currently taking any anticoagulation, have any bleeding history or any family history of bleeding disorders. Patient expressed understanding and wished to proceed. All questions were answered. Sterile technique was used throughout the entire procedure. Please see nursing notes for vital signs. Test dose was given through epidural catheter and negative prior to continuing to dose epidural or start infusion. Warning signs of high block given to the patient including shortness of breath, tingling/numbness in hands, complete motor  block, or any concerning symptoms with instructions to call for help. Patient was given instructions on fall risk and not to get out of bed. All questions and concerns addressed with instructions to call with any issues or inadequate analgesia.  Reason for block:procedure for pain

## 2020-01-24 NOTE — H&P (Addendum)
OBSTETRIC ADMISSION HISTORY AND PHYSICAL  Evelyn [name] is a 29 y.o. female [phone] with IUP at 33w1dby 6 wk [place] for I[place] 2/2 h/o IUFD @ 38 wk & mild polyhydramnios. She reports +FMs, No LOF, no VB, no blurry vision, headaches or peripheral edema, and RUQ pain.  She plans on bottle feeding. She request pp LStacie Acresfor birth control. [facility] [place] [facility] [place] [facility] [place]--->  Estimated Date of Delivery: 02/13/20  Sono:   01/21/20 _0 , CWD, normal anatomy, cephalic presentation,  [id number] 76% EFW   Prenatal History/Complications:  History of IUFD (38 wks) Genetic carrier for silent alpha thal and sickle cell-no FOB testing Polyhydramnios (AFI 29.1 on 01/21/20) GBS unknown, no risk factors UTI in preg (not treated, no TOC)  Past Medical History: Past Medical History:  Diagnosis Date  . Anemia   . Eczema   . Prior pregnancy with fetal demise    weeks 362gestation fetal demise  . Sickle cell trait ([place]   . SVD (spontaneous vaginal delivery)    x 4     Past Surgical History: Past Surgical History:  Procedure Laterality Date  . DILATION AND CURETTAGE OF UTERUS    . DILATION AND EVACUATION N/A 07/16/2016   Procedure: DILATATION AND EVACUATION (D&E) 2ND TRIMESTER;  Surgeon: PDonnamae Jude MD;  [facility];  Service: Gynecology;  Laterality: N/A;  . THERAPEUTIC ABORTION      Obstetrical History: OB History    Gravida  8   Para  4   Term  4   Preterm  0   AB  3   Living  3     SAB  1   TAB  2   Ectopic  0   Multiple  0   Live Births  4        Obstetric Comments  TAB, 09/17/17        Social History Social History   Socioeconomic History  . Marital status: Single    Spouse name: Not on file  . Number of children: Not on file  . Years of education: Not on file  . Highest education level: Not on file  Occupational History  . Not on file  Tobacco Use  . Smoking status: Former Smoker    Packs/day: 0.25     Years: 7.00    Pack years: 1.75    Types: Cigars  . Smokeless tobacco: Never Used  Vaping Use  . Vaping Use: Never used  Substance and Sexual Activity  . Alcohol use: No  . Drug use: No  . Sexual activity: Yes    Birth control/protection: None  Other Topics Concern  . Not on file  Social History Narrative  . Not on file   Social Determinants of Health   Financial Resource Strain:   . Difficulty of Paying Living Expenses: Not on file  Food Insecurity:   . Worried About RCharity fundraiserin the Last Year: Not on file  . Ran Out of Food in the Last Year: Not on file  Transportation Needs:   . Lack of Transportation (Medical): Not on file  . Lack of Transportation (Non-Medical): Not on file  Physical Activity:   . Days of Exercise per Week: Not on file  . Minutes of Exercise per Session: Not on file  Stress:   . Feeling of Stress : Not on file  Social Connections:   . Frequency of Communication with  Friends and Family: Not on file  . Frequency of Social Gatherings with Friends and Family: Not on file  . Attends Religious Services: Not on file  . Active Member of Clubs or Organizations: Not on file  . Attends Archivist Meetings: Not on file  . Marital Status: Not on file    Family History: Family History  Problem Relation Age of Onset  . Hypertension Father     Allergies: No Known Allergies  Facility-Administered Medications Prior to Admission  Medication Dose Route Frequency Provider Last Rate Last Admin  . aspirin chewable tablet 81 mg  81 mg Oral Once Griffin Basil, MD       Medications Prior to Admission  Medication Sig Dispense Refill Last Dose  . acetaminophen (TYLENOL) 325 MG tablet Take 2 tablets (650 mg total) by mouth every 4 (four) hours as needed for headache. (Patient not taking: Reported on 10/21/2019) 100 tablet 2   . Blood Pressure Monitoring (BLOOD PRESSURE KIT) DEVI 1 kit by Does not apply route as needed. (Patient not taking:  Reported on 09/12/2019) 1 each 0   . Doxylamine-Pyridoxine 10-10 MG TBEC Take 1 tablet by mouth in the morning. (Patient not taking: Reported on 09/12/2019) 30 tablet 0   . famotidine (PEPCID) 20 MG tablet Take 1 tablet (20 mg total) by mouth 2 (two) times daily. 60 tablet 1   . fluconazole (DIFLUCAN) 150 MG tablet Take 1 tablet (150 mg total) by mouth daily. (Patient not taking: Reported on 10/10/2019) 1 tablet 1   . metoCLOPramide (REGLAN) 10 MG tablet Take 1 tablet (10 mg total) by mouth every 6 (six) hours as needed for nausea or vomiting. (Patient not taking: Reported on 09/12/2019) 30 tablet 0   . Prenatal Vit-Fe Fumarate-FA (PRENATAL MULTIVITAMIN) TABS tablet Take 1 tablet by mouth daily at 12 noon.      Marland Kitchen terconazole (TERAZOL 3) 0.8 % vaginal cream Place 1 applicator vaginally at bedtime. Apply nightly for three nights. 20 g 0   . triamcinolone cream (KENALOG) 0.1 % Apply 1 application topically 2 (two) times daily. Stop use after 10 days (Patient not taking: Reported on 10/01/2019) 30 g 0      Review of Systems   All systems reviewed and negative except as stated in HPI  Blood pressure 126/77, pulse 92, temperature 98.1 F (36.7 C), temperature source Oral, resp. rate 18, height _0  (1.702 m), weight 83.5 kg, last menstrual period 04/27/2019, unknown if currently breastfeeding. General appearance: alert, cooperative and no distress Lungs: normal respiratory effort Heart: regular rate and rhythm Abdomen: soft, non-tender; gravid Pelvic: as stated below Extremities: no sign of DVT Presentation: cephalic by SVE Fetal monitoringBaseline: 140 bpm, Variability: Good {> 6 bpm), Accelerations: Reactive and Decelerations: Absent Uterine activityFrequency: irritable Dilation: 2 Exam by:: Dr. Jaclynn Guarneri, Dr. Sylvester Harder  Prenatal labs: ABO, Rh: --/--/B POS (11/18 1440) Antibody: NEG (11/18 1440) Rubella: 3.38 (05/12 1040) RPR: Non Reactive (09/08 0912)  HBsAg: Negative (05/12 1040)  HIV: Non  Reactive (09/08 0912)  GBS:   unknown 2 hr Glucola passed Genetic screening silent alpha thal carrier and sickle cell carrier Anatomy US normal  Prenatal Transfer Tool  Maternal Diabetes: No Genetic Screening: Normal Maternal Ultrasounds/Referrals: Normal Fetal Ultrasounds or other Referrals:  Referred to Materal Fetal Medicine  Maternal Substance Abuse:  No Significant Maternal Medications:  None Significant Maternal Lab Results: Other:  Group B strep unknown  Results for orders placed or performed during the hospital encounter of 01/24/20 (  from the past 24 hour(s))  Type and screen   Collection Time: 01/24/20  2:40 PM  Result Value Ref Range   ABO/RH(D) B POS    Antibody Screen NEG    Sample Expiration      01/27/2020,2359 Performed at [facility], [place] [address], [place], [place] [address]   CBC   Collection Time: 01/24/20  2:43 PM  Result Value Ref Range   WBC 7.7 4.0 - 10.5 K/uL   RBC 4.44 3.87 - 5.11 MIL/uL   Hemoglobin 11.6 (L) 12.0 - 15.0 g/dL   HCT 35.3 (L) 36 - 46 %   MCV 79.5 (L) 80.0 - 100.0 fL   MCH 26.1 26.0 - 34.0 pg   MCHC 32.9 30.0 - 36.0 g/dL   RDW 13.0 11.5 - 15.5 %   Platelets 282 150 - 400 K/uL   nRBC 0.0 0.0 - 0.2 %    Patient Active Problem List   Diagnosis Date Noted  . UTI in pregnancy 11/14/2019  . Alpha thalassemia silent carrier 07/27/2019  . Supervision of other normal pregnancy, antepartum 07/18/2019  . History of IUFD 07/18/2019  . Missed ab 07/15/2016  . Sickle cell trait (HCC)     Assessment/Plan:  Evelyn [name] is a 29 y.o. [id number] at 82w1dhere for IOL 2/2 h/o IUFD @ 38 wk & mild polyhydramnios.   #IOL: given cervical exam s/p cytotec x1 (1513). Will recheck and consider repeat cytotec vs pitocin.  #Genetic carrier for silent alpha thal and sickle cell, FOB not tested.  #History of IUFD @ 352 weeks   #Pain: Prn IV pain medications, does want epidural placed #FWB: Cat 1 strip, continue expectant labor  management #ID:GBS unknown: PCR and culture pending, no indications for antibiotics.  #MOF: Bottle #MOC:pp LArdeen Jourdain MD  01/24/2020, 5:15 PM  GME ATTESTATION:  I saw and evaluated the patient. I agree with the findings and the plan of care as documented in the resident's note.  AArrie Senate MD OB Fellow, [place]11/18/2021 5:33 PM

## 2020-01-25 ENCOUNTER — Encounter (HOSPITAL_COMMUNITY): Payer: Self-pay | Admitting: Obstetrics and Gynecology

## 2020-01-25 DIAGNOSIS — Z30430 Encounter for insertion of intrauterine contraceptive device: Secondary | ICD-10-CM | POA: Diagnosis not present

## 2020-01-25 DIAGNOSIS — O9902 Anemia complicating childbirth: Secondary | ICD-10-CM

## 2020-01-25 LAB — CULTURE, BETA STREP (GROUP B ONLY): Strep Gp B Culture: POSITIVE — AB

## 2020-01-25 LAB — RPR: RPR Ser Ql: NONREACTIVE

## 2020-01-25 MED ORDER — SENNOSIDES-DOCUSATE SODIUM 8.6-50 MG PO TABS
2.0000 | ORAL_TABLET | ORAL | Status: DC
  Administered 2020-01-25 – 2020-01-27 (×2): 2 via ORAL
  Filled 2020-01-25 (×2): qty 2

## 2020-01-25 MED ORDER — FERROUS SULFATE 325 (65 FE) MG PO TABS
325.0000 mg | ORAL_TABLET | ORAL | Status: DC
  Administered 2020-01-25 – 2020-01-27 (×2): 325 mg via ORAL
  Filled 2020-01-25 (×2): qty 1

## 2020-01-25 MED ORDER — MEASLES, MUMPS & RUBELLA VAC IJ SOLR: 0.5000 mL | Freq: Once | INTRAMUSCULAR | Status: DC

## 2020-01-25 MED ORDER — DIPHENHYDRAMINE HCL 25 MG PO CAPS
25.0000 mg | ORAL_CAPSULE | Freq: Four times a day (QID) | ORAL | Status: DC | PRN
  Administered 2020-01-25: 25 mg via ORAL
  Filled 2020-01-25: qty 1

## 2020-01-25 MED ORDER — ONDANSETRON HCL 4 MG PO TABS: 4.0000 mg | ORAL_TABLET | ORAL | Status: DC | PRN

## 2020-01-25 MED ORDER — SIMETHICONE 80 MG PO CHEW: 80.0000 mg | CHEWABLE_TABLET | ORAL | Status: DC | PRN

## 2020-01-25 MED ORDER — MEDROXYPROGESTERONE ACETATE 150 MG/ML IM SUSP: 150.0000 mg | INTRAMUSCULAR | Status: DC | PRN

## 2020-01-25 MED ORDER — TETANUS-DIPHTH-ACELL PERTUSSIS 5-2.5-18.5 LF-MCG/0.5 IM SUSY: 0.5000 mL | PREFILLED_SYRINGE | Freq: Once | INTRAMUSCULAR | Status: DC

## 2020-01-25 MED ORDER — IBUPROFEN 600 MG PO TABS
600.0000 mg | ORAL_TABLET | Freq: Four times a day (QID) | ORAL | Status: DC
  Administered 2020-01-25 – 2020-01-27 (×10): 600 mg via ORAL
  Filled 2020-01-25 (×10): qty 1

## 2020-01-25 MED ORDER — ONDANSETRON HCL 4 MG/2ML IJ SOLN: 4.0000 mg | INTRAMUSCULAR | Status: DC | PRN

## 2020-01-25 MED ORDER — PRENATAL MULTIVITAMIN CH
1.0000 | ORAL_TABLET | Freq: Every day | ORAL | Status: DC
  Administered 2020-01-25 – 2020-01-27 (×3): 1 via ORAL
  Filled 2020-01-25 (×3): qty 1

## 2020-01-25 MED ORDER — WITCH HAZEL-GLYCERIN EX PADS: 1.0000 "application " | MEDICATED_PAD | CUTANEOUS | Status: DC | PRN

## 2020-01-25 MED ORDER — ACETAMINOPHEN 325 MG PO TABS
650.0000 mg | ORAL_TABLET | ORAL | Status: DC | PRN
  Administered 2020-01-25 – 2020-01-26 (×4): 650 mg via ORAL
  Filled 2020-01-25 (×4): qty 2

## 2020-01-25 MED ORDER — ZOLPIDEM TARTRATE 5 MG PO TABS: 5.0000 mg | ORAL_TABLET | Freq: Every evening | ORAL | Status: DC | PRN

## 2020-01-25 MED ORDER — BENZOCAINE-MENTHOL 20-0.5 % EX AERO: 1.0000 "application " | INHALATION_SPRAY | CUTANEOUS | Status: DC | PRN

## 2020-01-25 MED ORDER — COCONUT OIL OIL: 1.0000 "application " | TOPICAL_OIL | Status: DC | PRN

## 2020-01-25 MED ORDER — DIBUCAINE (PERIANAL) 1 % EX OINT: 1.0000 "application " | TOPICAL_OINTMENT | CUTANEOUS | Status: DC | PRN

## 2020-01-25 NOTE — Discharge Summary (Signed)
Postpartum Discharge Summary    Patient Name: Evelyn [name] DOB: [birth year] MRN: [id number]  Date of admission: 01/24/2020 Delivery date:01/25/2020  Delivering provider: Christin Fudge  Date of discharge: 01/27/2020  Admitting diagnosis: History of IUFD [Z87.59] Intrauterine pregnancy: [redacted]w[redacted]d    Secondary diagnosis:  Active Problems:   Sickle cell trait ([place]   Supervision of other normal pregnancy, antepartum   History of IUFD   Alpha thalassemia silent carrier  Additional problems: None    Discharge diagnosis: Term Pregnancy Delivered                                              Post partum procedures:Liletta IUD Augmentation: AROM, Pitocin and Cytotec Complications: None  Hospital course: Induction of Labor With Vaginal Delivery   29y.o. yo [id number] 335w2das admitted to the hospital 01/24/2020 for induction of labor.  Indication for induction: Hx IUFD.  Patient had an uncomplicated labor course as follows: Membrane Rupture Time/Date: 11:52 PM ,01/24/2020   Delivery Method:Vaginal, Spontaneous  Episiotomy: None  Lacerations:  None  Details of delivery can be found in separate delivery note.  Patient had a routine postpartum course. Patient is discharged home 01/27/20.  Newborn Data: Birth date:01/25/2020  Birth time:2:47 AM  Gender:Female  Living status:Living  Apgars:8 ,9  [phone]   Magnesium Sulfate received: No BMZ received: No Rhophylac:N/A MMR:N/A T-DaP:Given prenatally Flu: No Transfusion:No  Physical exam  Vitals:   01/26/20 0518 01/26/20 1414 01/27/20 0040 01/27/20 0525  BP: 126/67 130/75 121/60 122/72  Pulse: 68 67 69 72  Resp: _0 Temp: (!) 97.4 F (36.3 C) 98 F (36.7 C) 98 F (36.7 C) 98.2 F (36.8 C)  TempSrc: Oral Oral Oral Oral  SpO2: 99%  100% 97%  Weight:      Height:       General: alert, cooperative and no distress Lochia: appropriate Uterine Fundus: firm Incision: N/A DVT Evaluation: No  evidence of DVT seen on physical exam. Labs: Lab Results  Component Value Date   WBC 7.7 01/24/2020   HGB 11.6 (L) 01/24/2020   HCT 35.3 (L) 01/24/2020   MCV 79.5 (L) 01/24/2020   PLT 282 01/24/2020   CMP Latest Ref Rng & Units 06/23/2019  Glucose 70 - 99 mg/dL 89  BUN 6 - 20 mg/dL 8  Creatinine 0.44 - 1.00 mg/dL 0.64  Sodium 135 - 145 mmol/L 136  Potassium 3.5 - 5.1 mmol/L 3.8  Chloride 98 - 111 mmol/L 104  CO2 22 - 32 mmol/L 23  Calcium 8.9 - 10.3 mg/dL 9.1  Total Protein 6.5 - 8.1 g/dL 6.6  Total Bilirubin 0.3 - 1.2 mg/dL 0.5  Alkaline Phos 38 - 126 U/L 35(L)  AST 15 - 41 U/L 13(L)  ALT 0 - 44 U/L 10   Edinburgh Score: Edinburgh Postnatal Depression Scale Screening Tool 01/25/2020  I have been able to laugh and see the funny side of things. 0  I have looked forward with enjoyment to things. 0  I have blamed myself unnecessarily when things went wrong. 1  I have been anxious or worried for no good reason. 2  I have felt scared or panicky for no good reason. 0  Things have been getting on top of me. 1  I have been so unhappy that I have had difficulty sleeping. 0  I have felt sad or miserable. 1  I have been so unhappy that I have been crying. 0  The thought of harming myself has occurred to me. 0  Edinburgh Postnatal Depression Scale Total 5     After visit meds:  Allergies as of 01/27/2020   No Known Allergies     Medication List    STOP taking these medications   acetaminophen 325 MG tablet Commonly known as: Tylenol   Doxylamine-Pyridoxine 10-10 MG Tbec   fluconazole 150 MG tablet Commonly known as: Diflucan   metoCLOPramide 10 MG tablet Commonly known as: Reglan   terconazole 0.8 % vaginal cream Commonly known as: TERAZOL 3   triamcinolone 0.1 % Commonly known as: KENALOG     TAKE these medications   Blood Pressure Kit Devi 1 kit by Does not apply route as needed.   famotidine 20 MG tablet Commonly known as: Pepcid Take 1 tablet (20 mg  total) by mouth 2 (two) times daily.   ibuprofen 800 MG tablet Commonly known as: ADVIL Take 1 tablet (800 mg total) by mouth every 8 (eight) hours as needed.   prenatal multivitamin Tabs tablet Take 1 tablet by mouth daily at 12 noon.        Discharge home in stable condition Infant Feeding: Bottle Infant Disposition:home with mother Discharge instruction: per After Visit Summary and Postpartum booklet. Activity: Advance as tolerated. Pelvic rest for 6 weeks.  Diet: routine diet Future Appointments: Future Appointments  Date Time Provider [place]  02/28/2020  2:20 PM Virginia Rochester, NP [place] None   Follow up Visit:   Please schedule this patient for a In person postpartum visit in 4 weeks with the following provider: Any provider. Additional Postpartum F/U:  Low risk pregnancy complicated by:  Delivery mode:  Vaginal, Spontaneous  Anticipated Birth Control:  PP IUD placed    Marcille Buffy DNP, CNM  01/27/20  11:16 AM

## 2020-01-25 NOTE — Anesthesia Postprocedure Evaluation (Signed)
Anesthesia Post Note  Patient: Evelyn [name]  Procedure(s) Performed: AN AD HOC LABOR EPIDURAL     Patient location during evaluation: Mother Baby Anesthesia Type: Epidural Level of consciousness: awake and alert Pain management: pain level controlled Vital Signs Assessment: post-procedure vital signs reviewed and stable Respiratory status: spontaneous breathing, nonlabored ventilation and respiratory function stable Cardiovascular status: stable Postop Assessment: no headache, no backache and epidural receding Anesthetic complications: no   No complications documented.  Last Vitals:  Vitals:   01/25/20 0431 01/25/20 0534  BP: 108/79 121/75  Pulse: 69 68  Resp: 18 18  Temp: 36.6 C 36.6 C  SpO2: 100% 100%    Last Pain:  Vitals:   01/25/20 0624  TempSrc:   PainSc: 7    Pain Goal: Patients Stated Pain Goal: 3 (01/25/20 0435)                 Rica Records

## 2020-01-25 NOTE — Procedures (Signed)
  Post-Placental IUD Insertion Procedure Note  Patient identified, informed consent signed prior to delivery, signed copy in chart, time out was performed.    Vaginal, labial and perineal areas thoroughly inspected for lacerations. 0 degree laceration identified - Liletta  - - IUD inserted with inserter per manufacturer's instructions.    Strings trimmed to the level of the introitus. Patient tolerated procedure well.  Patient given post procedure instructions and IUD care card with expiration date.  Patient is asked to keep IUD strings tucked in her vagina until her postpartum follow up visit in 4-6 weeks. Patient advised to abstain from sexual intercourse and pulling on strings before her follow-up visit. Patient verbalized an understanding of the plan of care and agrees.   

## 2020-01-25 NOTE — Progress Notes (Signed)
Patient Vitals for the past 4 hrs:  BP Temp Temp src Pulse Resp  01/25/20 0031 108/72 -- -- 71 --  01/25/20 0001 103/75 -- -- 74 --  01/24/20 2357 -- -- -- -- 18  01/24/20 2351 125/68 -- -- 78 --  01/24/20 2346 122/70 -- -- 73 --  01/24/20 2340 117/64 -- -- 74 --  01/24/20 2336 117/69 -- -- 79 --  01/24/20 2331 120/69 -- -- 75 18  01/24/20 2326 123/69 -- -- 74 --  01/24/20 2321 128/71 -- -- 73 --  01/24/20 2320 128/71 -- -- 73 --  01/24/20 2318 120/80 -- -- 77 --  01/24/20 2316 120/80 -- -- 77 --  01/24/20 2315 120/80 -- -- 77 --  01/24/20 2257 -- 98.2 F (36.8 C) Oral -- 20  01/24/20 2201 (!) 111/57 -- -- 73 --  01/24/20 2131 104/63 -- -- (!) 163 20  01/24/20 2101 105/69 -- -- 71 --   AROM w/clear fluid at 2345 by Vickii Penna, SNM.  cx 6/80/-1.  Pitocin at 4 mu/min, ctx q 2-3 minutes  FHR Cat 1.  Expect svd soon

## 2020-01-26 NOTE — Progress Notes (Signed)
POSTPARTUM PROGRESS NOTE  Post Partum Day 1  Subjective:  Evelyn [name] is a 29 y.o. [id number] s/p SVD at [redacted]w[redacted]d.  She reports she is doing well. No acute events overnight. She denies any problems with ambulating, voiding or po intake. Denies nausea or vomiting.  Pain is well controlled.  Lochia is appropriate.  Objective: Blood pressure 126/67, pulse 68, temperature (!) 97.4 F (36.3 C), temperature source Oral, resp. rate 16, height 5\' 7"  (1.702 m), weight 83.5 kg, last menstrual period 04/27/2019, SpO2 99 %, unknown if currently breastfeeding.  Physical Exam:  General: alert, cooperative and no distress Chest: no respiratory distress Heart:regular rate, distal pulses intact Abdomen: soft, nontender,  Uterine Fundus: firm, appropriately tender DVT Evaluation: No calf swelling or tenderness Extremities: NO LE edema Skin: warm, dry  Recent Labs    01/24/20 1443  HGB 11.6*  HCT 35.3*    Assessment/Plan: Evelyn [name] is a 29 y.o. 34 s/p SVD at [redacted]w[redacted]d   PPD#1 - Doing well  Routine postpartum care Contraception: PP Liletta placed  Feeding: Bottle  Dispo: Plan for discharge PPD#2 due to +GBS status without treatment.   LOS: 2 days   [redacted]w[redacted]d, D.O. OB Fellow  01/26/2020, 7:33 AM

## 2020-01-27 MED ORDER — IBUPROFEN 800 MG PO TABS: 800.0000 mg | ORAL_TABLET | Freq: Three times a day (TID) | ORAL | 0 refills | Status: DC | PRN

## 2020-01-27 NOTE — Progress Notes (Signed)
Post Partum Day 2 Subjective: No complaints, voiding, tolerating PO well, + flatus and + BM, Pain is 4/10 and controlled. Patient is bottle feeding. PP Liletta iplaced for BC. Patient feels ready to be discharged.  Objective: Blood pressure 122/72, pulse 72, temperature 98.2 F (36.8 C), temperature source Oral, resp. rate 15, height 5\' 7"  (1.702 m), weight 83.5 kg, last menstrual period 04/27/2019, SpO2 97 %, unknown if currently breastfeeding.  Physical Exam:  General: alert and no distress Lochia: appropriate Uterine Fundus: firm DVT Evaluation: No evidence of DVT seen on physical exam. - Negative Homan's sign. - No cords or calf tenderness. - No significant calf/ankle edema.  Recent Labs    01/24/20 1443  HGB 11.6*  HCT 35.3*    Assessment/Plan: Discharge home and Contraception (liletta) in place postpartum.    LOS: 3 days   01/26/20 PA-S 01/27/2020, 8:51 AM

## 2020-01-28 ENCOUNTER — Ambulatory Visit: Payer: Medicaid Other

## 2020-01-30 ENCOUNTER — Inpatient Hospital Stay (HOSPITAL_COMMUNITY): Payer: Medicaid Other

## 2020-02-04 ENCOUNTER — Ambulatory Visit: Payer: Medicaid Other

## 2020-02-28 ENCOUNTER — Other Ambulatory Visit: Payer: Self-pay

## 2020-02-28 ENCOUNTER — Encounter: Payer: Self-pay | Admitting: Nurse Practitioner

## 2020-02-28 ENCOUNTER — Ambulatory Visit (INDEPENDENT_AMBULATORY_CARE_PROVIDER_SITE_OTHER): Payer: Medicaid Other | Admitting: Nurse Practitioner

## 2020-02-28 DIAGNOSIS — O99893 Other specified diseases and conditions complicating puerperium: Secondary | ICD-10-CM

## 2020-02-28 DIAGNOSIS — R87612 Low grade squamous intraepithelial lesion on cytologic smear of cervix (LGSIL): Secondary | ICD-10-CM

## 2020-02-28 DIAGNOSIS — Z30431 Encounter for routine checking of intrauterine contraceptive device: Secondary | ICD-10-CM

## 2020-02-28 DIAGNOSIS — Z975 Presence of (intrauterine) contraceptive device: Secondary | ICD-10-CM | POA: Insufficient documentation

## 2020-02-28 DIAGNOSIS — T8332XA Displacement of intrauterine contraceptive device, initial encounter: Secondary | ICD-10-CM

## 2020-02-28 NOTE — Progress Notes (Signed)
    Post Partum Visit Note  Evelyn [name] is a 29 y.o. [id number] female who presents for a postpartum visit. She is 4 weeks postpartum following a normal spontaneous vaginal delivery.  I have fully reviewed the prenatal and intrapartum course. The delivery was at 37 gestational weeks.  Anesthesia: epidural. Postpartum course has been uncomplicated. Baby is doing well. Baby is feeding by bottle - Similac Neosure. Bleeding staining only. Bowel function is normal. Bladder function is normal. Patient is not sexually active. Contraception method is IUD. Postpartum depression screening: negative.   The pregnancy intention screening data noted above was reviewed. Potential methods of contraception were discussed. The patient elected to proceed with IUD or IUS.    The following portions of the patient's history were reviewed and updated as appropriate: allergies, current medications, past family history, past medical history, past social history, past surgical history and problem list.  Review of Systems Pertinent items noted in HPI and remainder of comprehensive ROS otherwise negative.    Objective:  LMP 04/27/2019    General:  alert, cooperative and no distress   Breasts:  not examined  Lungs: clear to auscultation bilaterally  Heart:  regular rate and rhythm, S1, S2 normal, no murmur, click, rub or gallop  Abdomen: soft, nontender   Vulva:  normal  Vagina: normal vagina  Cervix:  IUD strings not seen  Corpus: normal  Adnexa:  normal adnexa  Rectal Exam: Not performed.        Assessment:    Normal postpartum exam. Pap smear not done at today's visit.  IUD strings not seen - will plan [place] and have client return for pap smear.   Plan:   Essential components of care per ACOG recommendations:  1.  Mood and well being: Patient with negative depression screening today. Reviewed local resources for support.  - Patient does use tobacco. Iwe discussed reduction and for recently cessation risk of  relapse - hx of drug use? No    2. Infant care and feeding:   -Patient currently breastmilk feeding? No  -Social determinants of health (SDOH) reviewed in EPIC. No concerns  3. Sexuality, contraception and birth spacing - Patient does not want a pregnancy in the next year.   - Reviewed forms of contraception in tiered fashion. Patient desired IUD today.   - Discussed birth spacing of 18 months  4. Sleep and fatigue -Encouraged family/partner/community support of 4 hrs of uninterrupted sleep to help with mood and fatigue  5. Physical Recovery  - Discussed patients delivery - Patient had no lacerations,  - Patient has urinary incontinence? No - Patient is not safe to resume physical and sexual activity unless condoms are used for contraception - discussed with client  6.  Health Maintenance - Last pap smear done 7 months ago and was abnormal with LSIL  Did not repeat the pap today but will schedule to have it done when she returns for her reults from the ultrasound.  7.  IUD strings not seen - [place] ordered for location of IUD   Hamilton Capri, RN Center for [employer], [facility]  Nolene Bernheim, RN, MSN, NP-BC Nurse Practitioner, Biochemist, clinical for [employer], [facility] 02/28/2020 2:38 PM

## 2020-03-06 ENCOUNTER — Ambulatory Visit: Payer: Medicaid Other

## 2020-03-11 ENCOUNTER — Other Ambulatory Visit: Payer: Self-pay

## 2020-03-11 ENCOUNTER — Ambulatory Visit
Admission: RE | Admit: 2020-03-11 | Discharge: 2020-03-11 | Disposition: A | Discharge status: Home / Self Care | Payer: Medicaid Other | Source: Ambulatory Visit | Attending: Nurse Practitioner | Admitting: Nurse Practitioner

## 2020-03-11 DIAGNOSIS — T8332XA Displacement of intrauterine contraceptive device, initial encounter: Secondary | ICD-10-CM | POA: Diagnosis present

## 2020-03-12 ENCOUNTER — Other Ambulatory Visit (HOSPITAL_COMMUNITY)
Admission: RE | Admit: 2020-03-12 | Discharge: 2020-03-12 | Disposition: A | Discharge status: Home / Self Care | Payer: Medicaid Other | Source: Ambulatory Visit | Attending: Obstetrics and Gynecology | Admitting: Obstetrics and Gynecology

## 2020-03-12 ENCOUNTER — Encounter: Payer: Self-pay | Admitting: Obstetrics and Gynecology

## 2020-03-12 ENCOUNTER — Ambulatory Visit (INDEPENDENT_AMBULATORY_CARE_PROVIDER_SITE_OTHER): Payer: Medicaid Other | Admitting: Obstetrics and Gynecology

## 2020-03-12 VITALS — BP 134/79 | HR 105 | Wt 170.0 lb

## 2020-03-12 DIAGNOSIS — Z0000 Encounter for general adult medical examination without abnormal findings: Secondary | ICD-10-CM

## 2020-03-12 DIAGNOSIS — Z124 Encounter for screening for malignant neoplasm of cervix: Secondary | ICD-10-CM | POA: Diagnosis not present

## 2020-03-12 DIAGNOSIS — Z01419 Encounter for gynecological examination (general) (routine) without abnormal findings: Secondary | ICD-10-CM

## 2020-03-12 DIAGNOSIS — Z975 Presence of (intrauterine) contraceptive device: Secondary | ICD-10-CM | POA: Diagnosis not present

## 2020-03-12 NOTE — Progress Notes (Signed)
RGYN presents for F/U to discuss U/S results and Repeat pap per notes.  Last pap: 07/18/19 LSIL   HPV Negative

## 2020-03-12 NOTE — Progress Notes (Signed)
WELL-WOMAN PHYSICAL & PAP Patient name: Evelyn [name] MRN [id number]  Date of birth: 10-Sep-1990 Chief Complaint:   Follow-up (U/S Results/) and Abnormal Pap Smear (Repeat pap )  History of Present Illness:   Evelyn [name] is a 30 y.o. [id number] African American female being seen today for a routine well-woman exam.  Current complaints: needs repeat pap and discuss u/s results  PCP: none      does not desire labs No LMP recorded. The current method of family planning is IUD.  Last pap 07/18/2019. Results were: abnormal  LSIL with Neg HPV Last mammogram: n/a. Results were: n/a. Family h/o breast cancer: No Last colonoscopy: n/a. Results were: n/a. Family h/o colorectal cancer: No Review of Systems:   Pertinent items are noted in HPI Denies any headaches, blurred vision, fatigue, shortness of breath, chest pain, abdominal pain, abnormal vaginal discharge/itching/odor/irritation, problems with periods, bowel movements, urination, or intercourse unless otherwise stated above. Pertinent History Reviewed:  Reviewed past medical,surgical, social and family history.  Reviewed problem list, medications and allergies. Physical Assessment:   Vitals:   03/12/20 1534  BP: 134/79  Pulse: (!) 105  Weight: 170 lb (77.1 kg)  Body mass index is 26.63 kg/m.        Physical Examination:   General appearance - well appearing, and in no distress  Mental status - alert, oriented to person, place, and time  Psych:  She has a normal mood and affect  Skin - warm and dry, normal color, no suspicious lesions noted  Chest - effort normal, all lung fields clear to auscultation bilaterally  Heart - normal rate and regular rhythm  Neck:  midline trachea, no thyromegaly or nodules  Breasts - breasts appear normal, no suspicious masses, no skin or nipple changes or  axillary nodes  Abdomen - soft, nontender, nondistended, no masses or organomegaly  Pelvic - VULVA: normal appearing vulva with no masses,  tenderness or lesions  VAGINA: normal appearing vagina with normal color and discharge, no lesions  CERVIX: normal appearing cervix without discharge or lesions, no CMT  Thin prep pap is done with reflex HR HPV cotesting  UTERUS: uterus is felt to be normal size, shape, consistency and nontender   ADNEXA: No adnexal masses or tenderness noted.  Rectal - deferred  Extremities:  No swelling or varicosities noted     US PELVIC COMPLETE WITH TRANSVAGINAL (Accession [id number]) (Order [id number]) Narrative & Impression  CLINICAL DATA:  IUD strings not visible, assess IUD placement. Continued bleeding postpartum from delivery on 01/25/2020  EXAM: TRANSABDOMINAL AND TRANSVAGINAL ULTRASOUND OF PELVIS  TECHNIQUE: Both transabdominal and transvaginal ultrasound examinations of the pelvis were performed. Transabdominal technique was performed for global imaging of the pelvis including uterus, ovaries, adnexal regions, and pelvic cul-de-sac. It was necessary to proceed with endovaginal exam following the transabdominal exam to visualize the endometrium, IUD, and ovaries.  COMPARISON:  08/12/2015  FINDINGS: Uterus  Measurements: 10.9 x 5.7 x 7.8 cm = volume: 247 mL. Anteverted. Slightly heterogeneous myometrium. No focal mass.  Endometrium  Thickness: 3 mm. IUD visualized within the endometrial canal. Concerned that the IUD may be flipped 180 degrees, with arms inferiorly and tip of stem at upper uterine segment. No endometrial fluid.  Right ovary  Measurements: 5.7 x 2.8 x 3.2 cm = volume: 26.8 mL. 2.9 cm diameter cyst with a single septation within RIGHT ovary consistent with a benign cyst; no follow-up imaging recommended.  Left ovary  Measurements: 5.1 x 1.7 x 1.8 cm =  volume: 8.3 mL. Normal morphology without mass  Other findings  Trace free pelvic fluid.  No adnexal masses.  IMPRESSION: IUD is identified within the in endometrial canal, though  and concerned at the IUD may be flipped 180 degrees with the tip of the stem at the superior segment and the arms at the more inferior portion of uterus; orientation of the IUD could be obtained by either 3D ultrasound imaging or CT.  Remainder of exam unremarkable.   Electronically Signed   By: Ulyses Southward M.D.   On: 03/11/2020 14:31       Assessment & Plan:  1) Well woman exam with routine gynecological exam - Cytology - PAP( [place])  2) IUD (intrauterine device) in place  - IUD inverted 180 degrees in uterus (noted on U/S 03/11/2020) - Dr. Earlene Plater and I discussed with patient and her mother (on the phone) that IUD will still work effectively with minimal risks for becoming dislodged. - Patient and mother insistent on having IUD removed - Advised IUD will have to be removed under u/s guidance using a IUD hook and possibly requiring surgical removal. - Patient will schedule removal and reinsertion   Labs/procedures today: none  Mammogram at age 80 or sooner if problems Colonoscopy at age 70 or sooner if problems  No orders of the defined types were placed in this encounter.   Meds: No orders of the defined types were placed in this encounter.   Follow-up: Return in about 4 weeks (around 04/09/2020) for IUD removal/reinsertion.  Raelyn Mora MSN, CNM 03/12/2020 4:45 PM

## 2020-03-14 LAB — CYTOLOGY - PAP: Diagnosis: NEGATIVE

## 2020-03-17 ENCOUNTER — Telehealth: Payer: Self-pay

## 2020-03-17 NOTE — Telephone Encounter (Signed)
TC to patient concerning test results. LMTCB

## 2020-03-17 NOTE — Telephone Encounter (Signed)
-----   Message from Evelyn [name], [place] sent at 03/15/2020  1:07 PM EST ----- Please notify patient of normal pap results

## 2020-04-09 ENCOUNTER — Encounter: Payer: Self-pay | Admitting: Obstetrics and Gynecology

## 2020-04-09 ENCOUNTER — Other Ambulatory Visit: Payer: Self-pay

## 2020-04-09 ENCOUNTER — Ambulatory Visit (INDEPENDENT_AMBULATORY_CARE_PROVIDER_SITE_OTHER): Payer: Medicaid Other | Admitting: Obstetrics and Gynecology

## 2020-04-09 VITALS — BP 129/70 | HR 72 | Wt 164.0 lb

## 2020-04-09 DIAGNOSIS — Z30432 Encounter for removal of intrauterine contraceptive device: Secondary | ICD-10-CM | POA: Diagnosis not present

## 2020-04-09 DIAGNOSIS — Z30017 Encounter for initial prescription of implantable subdermal contraceptive: Secondary | ICD-10-CM | POA: Diagnosis not present

## 2020-04-09 MED ORDER — ETONOGESTREL 68 MG ~~LOC~~ IMPL
68.0000 mg | DRUG_IMPLANT | Freq: Once | SUBCUTANEOUS | Status: AC
Start: 2020-04-09 — End: 2020-04-09
  Administered 2020-04-09: 68 mg via SUBCUTANEOUS

## 2020-04-09 NOTE — Addendum Note (Signed)
Addended by: Cheree Ditto, Khing Belcher A on: 04/09/2020 04:43 PM   Modules accepted: Orders

## 2020-04-09 NOTE — Progress Notes (Signed)
[name] is a 30 y.o. female here for an IUD removal.   Last US done on 03/12/19 showed the IUD is malpositioned.  IMPRESSION: IUD is identified within the in endometrial canal, though and concerned at the IUD may be flipped 180 degrees with the tip of the stem at the superior segment and the arms at the more inferior portion of uterus; orientation of the IUD could be obtained by either 3D ultrasound imaging or CT.  IUD Removal  Patient was in the dorsal lithotomy position, normal external genitalia was noted. A speculum was placed in the patient's vagina, normal discharge was noted, no lesions. The multiparous cervix was visualized, no lesions, no abnormal discharge. The strings of the IUD were not visualized; a hook was attempted, along with a pap brush and ring forcep. Dr. Debroah Loop called to room and applied tenaculum and removed IUD with Evelyn [name]. The IUD was removed in its entirety. Patient tolerated the procedure well.    Evelyn Lope, NP 04/09/2020 4:33 PM        GYNECOLOGY OFFICE PROCEDURE NOTE  Evelyn [name] is a 30 y.o. [id number] here for Nexplanon insertion.   Nexplanon Insertion Procedure Patient identified, informed consent performed, consent signed.   Patient does understand that irregular bleeding is a very common side effect of this medication. She was advised to have backup contraception for one week after placement. Pregnancy test in clinic today was negative.  Appropriate time out taken.  Patient's left arm was prepped and draped in the usual sterile fashion. The ruler used to measure and mark insertion area.  Patient was prepped with alcohol swab and then injected with 3 ml of 1% lidocaine.  She was prepped with betadine, Nexplanon removed from packaging,  Device confirmed in needle, then inserted full length of needle and withdrawn per handbook instructions. Nexplanon was able to palpated in the patient's arm; patient palpated the insert herself. There  was minimal blood loss.  Patient insertion site covered with guaze and a pressure bandage to reduce any bruising.  The patient tolerated the procedure well and was given post procedure instructions.     Evelyn [name], Evelyn Rutherford, NP Faculty Practice Center for [employer], [facility]

## 2020-08-28 ENCOUNTER — Ambulatory Visit: Payer: Medicaid Other | Admitting: Obstetrics

## 2020-08-29 ENCOUNTER — Other Ambulatory Visit (HOSPITAL_COMMUNITY)
Admission: RE | Admit: 2020-08-29 | Discharge: 2020-08-29 | Disposition: A | Discharge status: Home / Self Care | Payer: Medicaid Other | Source: Ambulatory Visit | Attending: Obstetrics | Admitting: Obstetrics

## 2020-08-29 ENCOUNTER — Ambulatory Visit (INDEPENDENT_AMBULATORY_CARE_PROVIDER_SITE_OTHER): Payer: Medicaid Other | Admitting: Obstetrics

## 2020-08-29 ENCOUNTER — Other Ambulatory Visit: Payer: Self-pay

## 2020-08-29 ENCOUNTER — Encounter: Payer: Self-pay | Admitting: Obstetrics

## 2020-08-29 VITALS — BP 119/76 | HR 69 | Ht 68.0 in | Wt 177.8 lb

## 2020-08-29 DIAGNOSIS — N760 Acute vaginitis: Secondary | ICD-10-CM

## 2020-08-29 DIAGNOSIS — B9689 Other specified bacterial agents as the cause of diseases classified elsewhere: Secondary | ICD-10-CM

## 2020-08-29 DIAGNOSIS — N898 Other specified noninflammatory disorders of vagina: Secondary | ICD-10-CM

## 2020-08-29 MED ORDER — METRONIDAZOLE 500 MG PO TABS: 500.0000 mg | ORAL_TABLET | Freq: Two times a day (BID) | ORAL | 2 refills | Status: DC

## 2020-08-29 NOTE — Progress Notes (Signed)
Patient is in the office reporting vaginal discharge and odor. Pap smear is up to date 03-12-20

## 2020-08-29 NOTE — Progress Notes (Signed)
Patient ID: Evelyn [name], female   DOB: 24-Nov-1990, 30 y.o.   MRN: [id number]  Chief Complaint  Patient presents with   GYN    HPI Evelyn [name] is a 30 y.o. female.  Complains of malodorous vaginal discharge. HPI  Past Medical History:  Diagnosis Date   Anemia    Eczema    Prior pregnancy with fetal demise    weeks 38 gestation fetal demise   Sickle cell trait (HCC)    SVD (spontaneous vaginal delivery)    x 4     Past Surgical History:  Procedure Laterality Date   DILATION AND CURETTAGE OF UTERUS     DILATION AND EVACUATION N/A 07/16/2016   Procedure: DILATATION AND EVACUATION (D&E) 2ND TRIMESTER;  Surgeon: Reva Bores, MD;  Location: [facility];  Service: Gynecology;  Laterality: N/A;   THERAPEUTIC ABORTION      Family History  Problem Relation Age of Onset   Hypertension Father     Social History Social History   Tobacco Use   Smoking status: Former    Packs/day: 0.25    Years: 7.00    Pack years: 1.75    Types: Cigars, Cigarettes   Smokeless tobacco: Never  Vaping Use   Vaping Use: Never used  Substance Use Topics   Alcohol use: No   Drug use: No    No Known Allergies  Current Outpatient Medications  Medication Sig Dispense Refill   metroNIDAZOLE (FLAGYL) 500 MG tablet Take 1 tablet (500 mg total) by mouth 2 (two) times daily. 14 tablet 2   No current facility-administered medications for this visit.    Review of Systems Review of Systems Constitutional: negative for fatigue and weight loss Respiratory: negative for cough and wheezing Cardiovascular: negative for chest pain, fatigue and palpitations Gastrointestinal: negative for abdominal pain and change in bowel habits Genitourinary: positive for malodorous vaginal discharge Integument/breast: negative for nipple discharge Musculoskeletal:negative for myalgias Neurological: negative for gait problems and tremors Behavioral/Psych: negative for abusive relationship, depression Endocrine:  negative for temperature intolerance      Blood pressure 119/76, pulse 69, height 5\' 8"  (1.727 m), weight 177 lb 12.8 oz (80.6 kg), not currently breastfeeding.  Physical Exam Physical Exam General:   Alert and no distress  Skin:   no rash or abnormalities  Lungs:   clear to auscultation bilaterally  Heart:   regular rate and rhythm, S1, S2 normal, no murmur, click, rub or gallop  Breasts:   Not examined  Abdomen:  normal findings: no organomegaly, soft, non-tender and no hernia  Pelvis:  External genitalia: normal general appearance Urinary system: urethral meatus normal and bladder without fullness, nontender Vaginal: normal without tenderness, induration or masses Cervix: normal appearance Adnexa: normal bimanual exam Uterus: anteverted and non-tender, normal size    I have spent a total of 15 minutes of face-to-face time, excluding clinical staff time, reviewing notes and preparing to see patient, ordering tests and/or medications, and counseling the patient.   Data Reviewed Wet Prep  Assessment     1. Vaginal discharge Rx: - Cervicovaginal ancillary only( [place])  2. BV (bacterial vaginosis) Rx: - metroNIDAZOLE (FLAGYL) 500 MG tablet; Take 1 tablet (500 mg total) by mouth 2 (two) times daily.  Dispense: 14 tablet; Refill: 2     Plan   Follow up prn  Meds ordered this encounter  Medications   metroNIDAZOLE (FLAGYL) 500 MG tablet    Sig: Take 1 tablet (500 mg total) by mouth 2 (two) times  daily.    Dispense:  14 tablet    Refill:  2      Brock Bad, MD 08/29/2020 9:53 AM

## 2020-09-02 ENCOUNTER — Other Ambulatory Visit: Payer: Self-pay | Admitting: Obstetrics

## 2020-09-02 LAB — CERVICOVAGINAL ANCILLARY ONLY
Bacterial Vaginitis (gardnerella): POSITIVE — AB
Candida Glabrata: NEGATIVE
Candida Vaginitis: NEGATIVE
Chlamydia: NEGATIVE
Comment: NEGATIVE
Comment: NEGATIVE
Comment: NEGATIVE
Comment: NEGATIVE
Comment: NEGATIVE
Comment: NORMAL
Neisseria Gonorrhea: NEGATIVE
Trichomonas: NEGATIVE

## 2020-10-28 ENCOUNTER — Ambulatory Visit
Admission: RE | Admit: 2020-10-28 | Discharge: 2020-10-28 | Disposition: A | Discharge status: Home / Self Care | Payer: No Typology Code available for payment source | Source: Ambulatory Visit | Attending: Obstetrics and Gynecology | Admitting: Obstetrics and Gynecology

## 2020-10-28 ENCOUNTER — Other Ambulatory Visit: Payer: Self-pay | Admitting: Obstetrics and Gynecology

## 2020-10-28 DIAGNOSIS — R7611 Nonspecific reaction to tuberculin skin test without active tuberculosis: Secondary | ICD-10-CM

## 2021-01-20 ENCOUNTER — Telehealth: Payer: Self-pay

## 2021-01-20 NOTE — Telephone Encounter (Signed)
Pt is calling stating that she has a sore area on her right groin. Asked patient if she shaves and she says yes but does not look like an infected hair follicle. Area is more flat like when you scrape your near. It has a clear, opulent drainage. States it does itch but has not really burned. Will bring patient in for evaluation.

## 2021-01-21 ENCOUNTER — Other Ambulatory Visit: Payer: Self-pay

## 2021-01-21 ENCOUNTER — Ambulatory Visit (INDEPENDENT_AMBULATORY_CARE_PROVIDER_SITE_OTHER): Payer: Medicaid Other | Admitting: Women's Health

## 2021-01-21 VITALS — BP 128/86 | HR 72 | Wt 169.2 lb

## 2021-01-21 DIAGNOSIS — L989 Disorder of the skin and subcutaneous tissue, unspecified: Secondary | ICD-10-CM

## 2021-01-21 DIAGNOSIS — L739 Follicular disorder, unspecified: Secondary | ICD-10-CM

## 2021-01-25 NOTE — Progress Notes (Signed)
  History:  [name] is a 30 y.o. [id number] who presents to clinic today for possible herpetic lesion on groin. Patient states the lesion has been present for a few days and that the area is irritated, but denies having any blisters or having more than one lesion, or any tingling/numbness/flu-like illness prior to the lesion appearing. Patient denies history of herpes outbreaks. Patient reports she has been picking and squeezing at the site and has had some minor white-yellow fluid come out initially. Patient reports shaving pubic area.  The following portions of the patient's history were reviewed and updated as appropriate: allergies, current medications, family history, past medical history, social history, past surgical history and problem list.  Review of Systems:  Review of Systems  Constitutional:  Negative for chills, diaphoresis and fever.  Respiratory:  Negative for shortness of breath.   Cardiovascular:  Negative for chest pain.  Gastrointestinal:  Negative for abdominal pain, nausea and vomiting.  Skin:  Negative for itching and rash.       Single lesion in groin.     Objective:  Physical Exam BP 128/86   Pulse 72   Wt 169 lb 3.2 oz (76.7 kg)   BMI 25.73 kg/m   Physical Exam Vitals and nursing note reviewed. Exam conducted with a chaperone present.  Constitutional:      General: She is not in acute distress.    Appearance: Normal appearance. She is not ill-appearing, toxic-appearing or diaphoretic.  HENT:     Head: Normocephalic and atraumatic.  Pulmonary:     Effort: Pulmonary effort is normal.  Skin:    General: Skin is warm and dry.     Comments: Small area of folliculitis along inguinal fold on patient right. No s/sx of infection.  Neurological:     Mental Status: She is alert and oriented to person, place, and time.  Psychiatric:        Mood and Affect: Mood normal.        Behavior: Behavior normal.        Thought Content: Thought content normal.         Judgment: Judgment normal.     Labs and Imaging No results found for this or any previous visit (from the past 24 hour(s)).  No results found.  Health Maintenance Due  Topic Date Due   Pneumococcal Vaccine 82-25 Years old (1 - PCV) Never done   INFLUENZA VACCINE  10/06/2020   COVID-19 Vaccine (2 - Pfizer series) 10/18/2020   Assessment & Plan:  1. Skin lesion -folliculitis  2. Folliculitis -discussed causes of folliculitis, prevention, treatment, when to return for further evaluation -pt interested in discussing herpes further, discussed swab vs. blood testing and discouraged blood testing  Approximately 10 minutes of total time was spent with this patient on counseling  Pranish Akhavan, Odie Sera, NP 01/25/2021 3:58 PM

## 2021-01-25 NOTE — Patient Instructions (Signed)
Genital Herpes °Genital herpes is a common sexually transmitted infection (STI) that is caused by a virus. The virus spreads from person to person through sexual contact. The infection can cause itching, blisters, and sores around the genitals or rectum. Symptoms may last for several days and then go away. This is called an outbreak. The virus remains in the body, however, so more outbreaks may happen in the future. The time between outbreaks varies and can be from months to years. °Genital herpes can affect anyone. It is particularly concerning for pregnant women because the virus can be passed to the baby during delivery and can cause serious problems. Genital herpes is also a concern for people who have a weak disease-fighting system (immune system). °What are the causes? °This condition is caused by the human herpesvirus, also called herpes simplex virus, type 1 or type 2 (HSV-1 or HSV-2). The virus may spread through: °Sexual contact with an infected person, including vaginal, anal, and oral sex. °Contact with fluid from a herpes sore. °The skin. This means that you can get herpes from an infected partner even if there are no blisters or sores present. Your partner may not know that he or she is infected. °What increases the risk? °You are more likely to develop this condition if: °You have sex with many partners. °You do not use latex condoms during sex. °What are the signs or symptoms? °Most people do not have symptoms (are asymptomatic), or they have mild symptoms that may be mistaken for other skin problems. Symptoms may include: °Small, red bumps near the genitals, rectum, or mouth. These bumps turn into blisters and then sores. °Flu-like symptoms, including: °Fever. °Body aches. °Swollen lymph nodes. °Headache. °Painful urination. °Pain and itching in the genital area or rectal area. °Vaginal discharge. °Tingling or shooting pain in the legs and buttocks. °Generally, symptoms are more severe and last  longer during the first (primary) outbreak. Flu-like symptoms are also more common during the primary outbreak. °How is this diagnosed? °This condition may be diagnosed based on: °A physical exam. °Your medical history. °Blood tests. °A test of a fluid sample (culture) from an open sore. °How is this treated? °There is no cure for this condition, but treatment with antiviral medicines that are taken by mouth (orally) can do the following: °Speed up healing and relieve symptoms. °Help to reduce the spread of the virus to sexual partners. °Limit the chance of future outbreaks, or make future outbreaks shorter. °Lessen symptoms of future outbreaks. °Your health care provider may also recommend pain relief medicines, such as aspirin or ibuprofen. °Follow these instructions at home: °If you have an outbreak: °Keep the affected areas dry and clean. °Avoid rubbing or touching blisters and sores. If you do touch blisters or sores: °Wash your hands thoroughly with soap and water. °Do not touch your eyes afterward. °To help relieve pain or itching, you may take the following actions as told by your health care provider: °Apply a cold, wet cloth (cold compress) to affected areas 4-6 times a day. °Apply a substance that protects your skin and reduces bleeding (astringent). °Apply a gel that helps relieve pain around sores (lidocaine gel). °Take a warm, shallow bath that cleans the genital area (sitz bath). °Sexual activity °Do not have sexual contact during active outbreaks. °Practice safe sex. Herpes can spread even if your partner does not have blisters or sores. Latex condoms and female condoms may help prevent the spread of the herpes virus. °General instructions °Take over-the-counter and   prescription medicines only as told by your health care provider. °Keep all follow-up visits as told by your health care provider. This is important. °How is this prevented? °Use condoms. Although you can get genital herpes during sexual  contact even with the use of a condom, a condom can provide some protection. °Avoid having multiple sexual partners. °Talk with your sexual partner about any symptoms either of you may have. Also, talk with your partner about any history of STIs. °Get tested for STIs before you have sex. Ask your partner to do the same. °Do not have sexual contact if you have active symptoms of genital herpes. °Contact a health care provider if: °Your symptoms are not improving with medicine. °Your symptoms return, or you have new symptoms. °You have a fever. °You have abdominal pain. °You have redness, swelling, or pain in your eye. °You notice new sores on other parts of your body. °You are a woman and you experience bleeding between menstrual periods. °You have had herpes and you become pregnant or plan to become pregnant. °Summary °Genital herpes is a common sexually transmitted infection (STI) that is caused by the herpes simplex virus, type 1 or type 2 (HSV-1 or HSV-2). °These viruses are most often spread through sexual contact with an infected person. °You are more likely to develop this condition if you have sex with many partners or you do not use condoms during sex. °Most people do not have symptoms (are asymptomatic) or have mild symptoms that may be mistaken for other skin problems. Symptoms occur as outbreaks that may happen months or years apart. °There is no cure for this condition, but treatment with oral antiviral medicines can reduce symptoms, reduce the chance of spreading the virus to a partner, prevent future outbreaks, or shorten future outbreaks. °This information is not intended to replace advice given to you by your health care provider. Make sure you discuss any questions you have with your health care provider. °Document Revised: 11/03/2020 Document Reviewed: 11/02/2018 °Elsevier Patient Education © 2022 [employer] ° °

## 2021-02-20 ENCOUNTER — Ambulatory Visit (HOSPITAL_COMMUNITY)
Admission: EM | Admit: 2021-02-20 | Discharge: 2021-02-20 | Disposition: A | Discharge status: Home / Self Care | Payer: Medicaid Other | Attending: Emergency Medicine | Admitting: Emergency Medicine

## 2021-02-20 ENCOUNTER — Encounter (HOSPITAL_COMMUNITY): Payer: Self-pay

## 2021-02-20 ENCOUNTER — Other Ambulatory Visit: Payer: Self-pay

## 2021-02-20 DIAGNOSIS — L02512 Cutaneous abscess of left hand: Secondary | ICD-10-CM | POA: Diagnosis not present

## 2021-02-20 HISTORY — DX: Respiratory tuberculosis unspecified: A15.9

## 2021-02-20 MED ORDER — DOXYCYCLINE HYCLATE 100 MG PO CAPS: 100.0000 mg | ORAL_CAPSULE | Freq: Two times a day (BID) | ORAL | 0 refills | Status: DC

## 2021-02-20 NOTE — ED Triage Notes (Signed)
Pt presents to the office with abscess on left hand middle finger x 3 weeks.

## 2021-02-20 NOTE — Discharge Instructions (Addendum)
Take doxycyline twice a day for 7 days  May take ibuprofen 800 mg every 8 hours for pain if needed  Hold warm-hot compresses to affected area at least 4 times a day, this helps to facilitate draining, the more the better  Please return for evaluation for increased swelling, increased tenderness or pain, non healing site, non draining site, you begin to have fever or chills. If numbness, tingling, limited movement or if infection moves to palm side of hand please go see hand specialist  We reviewed the etiology of recurrent abscesses of skin.  Skin abscesses are collections of pus within the dermis and deeper skin tissues. Skin abscesses manifest as painful, tender, fluctuant, and erythematous nodules, frequently surmounted by a pustule and surrounded by a rim of erythematous swelling.  Spontaneous drainage of purulent material may occur.  Fever can occur on occasion.    -Skin abscesses can develop in healthy individuals with no predisposing conditions other than skin or nasal carriage of Staphylococcus aureus.  Individuals in close contact with others who have active infection with skin abscesses are at increased risk which is likely to explain why twin brother has similar episodes.   In addition, any process leading to a breach in the skin barrier can also predispose to the development of a skin abscesses, such as atopic dermatitis.

## 2021-02-20 NOTE — ED Provider Notes (Signed)
[facility]    CSN: [id number] Arrival date & time: 02/20/21  1656      History   Chief Complaint Chief Complaint  Patient presents with   Abscess    HPI Evelyn [name] is a 30 y.o. female.   Patient presents with swelling and tenderness to Left middle finger beginning today.  Affected area intially began as a abrasion that occurred three weeks ago.  Endorses severe pain this morning that has subsided throughout the day.  Range of motion intatc.  Denies numbness, tingling, prior injury or trauma.  Has not attempted treatment.  History of anemia.  Past Medical History:  Diagnosis Date   Anemia    Eczema    Prior pregnancy with fetal demise    weeks 38 gestation fetal demise   Sickle cell trait (HCC)    SVD (spontaneous vaginal delivery)    x 4    TB (tuberculosis)     Patient Active Problem List   Diagnosis Date Noted   Nexplanon insertion 04/09/2020   Encounter for IUD removal 04/09/2020   IUD (intrauterine device) in place 03/12/2020   Presence of IUD 02/28/2020   LGSIL on Pap smear of cervix 02/28/2020   Alpha thalassemia silent carrier 07/27/2019   History of IUFD 07/18/2019   Sickle cell trait (HCC)     Past Surgical History:  Procedure Laterality Date   DILATION AND CURETTAGE OF UTERUS     DILATION AND EVACUATION N/A 07/16/2016   Procedure: DILATATION AND EVACUATION (D&E) 2ND TRIMESTER;  Surgeon: Reva Bores, MD;  Location: [facility];  Service: Gynecology;  Laterality: N/A;   THERAPEUTIC ABORTION      OB History     Gravida  8   Para  5   Term  5   Preterm  0   AB  3   Living  4      SAB  1   IAB  2   Ectopic  0   Multiple  0   Live Births  5        Obstetric Comments  TAB, 09/17/17          Home Medications    Prior to Admission medications   Medication Sig Start Date End Date Taking? Authorizing Provider  isoniazid (NYDRAZID) 300 MG tablet Take 300 mg by mouth daily.    [provider]   metroNIDAZOLE (FLAGYL) 500 MG tablet Take 1 tablet (500 mg total) by mouth 2 (two) times daily. 08/29/20   Brock Bad, MD  cetirizine (ZYRTEC) 10 MG tablet Take 1 tablet (10 mg total) by mouth daily. 06/27/18 05/09/19  Dahlia Byes A, NP  dicyclomine (BENTYL) 20 MG tablet Take 1 tablet (20 mg total) by mouth 2 (two) times daily. 06/11/19 06/11/19  Darr, Gerilyn Pilgrim, PA-C    Family History Family History  Problem Relation Age of Onset   Hypertension Father     Social History Social History   Tobacco Use   Smoking status: Former    Packs/day: 0.25    Years: 7.00    Pack years: 1.75    Types: Cigars, Cigarettes   Smokeless tobacco: Never  Vaping Use   Vaping Use: Never used  Substance Use Topics   Alcohol use: No   Drug use: No     Allergies   Patient has no known allergies.   Review of Systems Review of Systems  Constitutional: Negative.   Respiratory: Negative.    Cardiovascular: Negative.   Skin:  Positive for wound. Negative for color change, pallor and rash.  Neurological: Negative.     Physical Exam Triage Vital Signs ED Triage Vitals  Enc Vitals Group     BP 02/20/21 1755 121/73     Pulse Rate 02/20/21 1755 74     Resp 02/20/21 1755 16     Temp 02/20/21 1755 99.2 F (37.3 C)     Temp Source 02/20/21 1755 Oral     SpO2 02/20/21 1755 100 %     Weight --      Height --      Head Circumference --      Peak Flow --      Pain Score 02/20/21 1756 10     Pain Loc --      Pain Edu? --      Excl. in GC? --    No data found.  Updated Vital Signs BP 121/73 (BP Location: Left Arm)    Pulse 74    Temp 99.2 F (37.3 C) (Oral)    Resp 16    SpO2 100%   Visual Acuity Right Eye Distance:   Left Eye Distance:   Bilateral Distance:    Right Eye Near:   Left Eye Near:    Bilateral Near:     Physical Exam Constitutional:      Appearance: Normal appearance. She is normal weight.  HENT:     Head: Normocephalic.  Eyes:     Extraocular Movements: Extraocular  movements intact.  Pulmonary:     Effort: Pulmonary effort is normal.  Skin:    Comments: Abscess present over the dorsal aspect of the proximal phalanx of the left middle finger, tender to palpation, nondraining, no involvement of the palmar aspect, sensation intact, capillary refill less than 3  Neurological:     Mental Status: She is alert and oriented to person, place, and time. Mental status is at baseline.  Psychiatric:        Mood and Affect: Mood normal.        Behavior: Behavior normal.     UC Treatments / Results  Labs (all labs ordered are listed, but only abnormal results are displayed) Labs Reviewed - No data to display  EKG   Radiology No results found.  Procedures Incision and Drainage  Date/Time: 02/22/2021 9:44 PM Performed by: Valinda Hoar, NP Authorized by: Valinda Hoar, NP   Consent:    Consent obtained:  Verbal   Consent given by:  Patient   Risks, benefits, and alternatives were discussed: yes     Risks discussed:  Incomplete drainage and infection   Alternatives discussed:  Delayed treatment Universal protocol:    Procedure explained and questions answered to patient or proxy's satisfaction: yes     Patient identity confirmed:  Verbally with patient Location:    Type:  Abscess   Size:  1x1   Location: left middle finger. Pre-procedure details:    Skin preparation:  Chlorhexidine with alcohol Sedation:    Sedation type:  None Anesthesia:    Anesthesia method:  Local infiltration   Local anesthetic:  Lidocaine 1% w/o epi Procedure type:    Complexity:  Simple Procedure details:    Ultrasound guidance: no     Needle aspiration: no     Incision types:  Single straight   Drainage:  Bloody   Drainage amount:  Scant   Wound treatment:  Wound left open   Packing materials:  None Post-procedure details:    Procedure  completion:  Tolerated (including critical care time)  Medications Ordered in UC Medications - No data to  display  Initial Impression / Assessment and Plan / UC Course  I have reviewed the triage vital signs and the nursing notes.  Pertinent labs & imaging results that were available during my care of the patient were reviewed by me and considered in my medical decision making (see chart for details).  Abscess of the left middle finger  1.  Doxycycline 100 mg twice daily for 7 days 2.  Ibuprofen 800 mg 3 times daily 3.  Warm compresses to the affected area at least 4 times a day 4.  Patient given strict precautions for worsening signs of infection to return to urgent care or follow-up with hand specialist for signs of decreased range of motion, numbness, tingling, involvement of the palmar aspect, verbalized understanding  Final Clinical Impressions(s) / UC Diagnoses   Final diagnoses:  None   Discharge Instructions   None    ED Prescriptions   None    PDMP not reviewed this encounter.   Valinda Hoar, [place] 02/22/21 2146

## 2021-02-22 DIAGNOSIS — L02512 Cutaneous abscess of left hand: Secondary | ICD-10-CM | POA: Diagnosis not present

## 2022-02-04 ENCOUNTER — Ambulatory Visit (HOSPITAL_COMMUNITY)
Admission: EM | Admit: 2022-02-04 | Discharge: 2022-02-04 | Disposition: A | Discharge status: Home / Self Care | Payer: Medicaid Other | Attending: Internal Medicine | Admitting: Internal Medicine

## 2022-02-04 ENCOUNTER — Encounter (HOSPITAL_COMMUNITY): Payer: Self-pay | Admitting: *Deleted

## 2022-02-04 DIAGNOSIS — M546 Pain in thoracic spine: Secondary | ICD-10-CM | POA: Insufficient documentation

## 2022-02-04 DIAGNOSIS — R6883 Chills (without fever): Secondary | ICD-10-CM

## 2022-02-04 DIAGNOSIS — Z20822 Contact with and (suspected) exposure to covid-19: Secondary | ICD-10-CM | POA: Diagnosis not present

## 2022-02-04 DIAGNOSIS — Z87891 Personal history of nicotine dependence: Secondary | ICD-10-CM | POA: Insufficient documentation

## 2022-02-04 DIAGNOSIS — J069 Acute upper respiratory infection, unspecified: Secondary | ICD-10-CM | POA: Diagnosis not present

## 2022-02-04 LAB — RESP PANEL BY RT-PCR (FLU A&B, COVID) ARPGX2
Influenza A by PCR: NEGATIVE
Influenza B by PCR: NEGATIVE
SARS Coronavirus 2 by RT PCR: NEGATIVE

## 2022-02-04 MED ORDER — PROMETHAZINE-DM 6.25-15 MG/5ML PO SYRP: 5.0000 mL | ORAL_SOLUTION | Freq: Every evening | ORAL | 0 refills | Status: DC | PRN

## 2022-02-04 MED ORDER — IBUPROFEN 800 MG PO TABS
ORAL_TABLET | ORAL | Status: AC
  Filled 2022-02-04: qty 1

## 2022-02-04 MED ORDER — BENZONATATE 100 MG PO CAPS: 100.0000 mg | ORAL_CAPSULE | Freq: Three times a day (TID) | ORAL | 0 refills | Status: DC

## 2022-02-04 MED ORDER — GUAIFENESIN ER 1200 MG PO TB12: 1200.0000 mg | ORAL_TABLET | Freq: Two times a day (BID) | ORAL | 0 refills | Status: AC

## 2022-02-04 MED ORDER — IBUPROFEN 800 MG PO TABS
800.0000 mg | ORAL_TABLET | Freq: Once | ORAL | Status: AC
  Administered 2022-02-04: 800 mg via ORAL

## 2022-02-04 NOTE — ED Provider Notes (Signed)
[facility]    CSN: [id number] Arrival date & time: 02/04/22  1933      History   Chief Complaint Chief Complaint  Patient presents with   Chills   Generalized Body Aches    HPI Evelyn [name] is a 31 y.o. female.   Patient presents urgent care for evaluation of productive cough, nasal congestion, headache, sore throat, and upper back pain that started Sunday January 31, 2022.  No known sick contacts.  Home test for COVID-19 was negative yesterday.  Headache is generalized and intermittent.  No blurry vision or dizziness reported.  Reports chills without known documented fever.  Denies nausea, vomiting, diarrhea, abdominal pain, urinary symptoms, flank pain, chest pain, shortness of breath, and generalized weakness.  Denies history of chronic medical problems chronic respiratory problems.  Cough is productive and worse at nighttime.  Sore throat is also intermittent and patient believes that the sore throat is due to the dry air.  She is a former smoker and denies current drug use.  She has been using DayQuil for symptoms with some relief.      Past Medical History:  Diagnosis Date   Anemia    Eczema    Prior pregnancy with fetal demise    weeks 38 gestation fetal demise   Sickle cell trait ([place])    SVD (spontaneous vaginal delivery)    x 4    TB (tuberculosis)     Patient Active Problem List   Diagnosis Date Noted   Nexplanon insertion 04/09/2020   Encounter for IUD removal 04/09/2020   IUD (intrauterine device) in place 03/12/2020   Presence of IUD 02/28/2020   LGSIL on Pap smear of cervix 02/28/2020   Alpha thalassemia silent carrier 07/27/2019   History of IUFD 07/18/2019   Sickle cell trait ([place])     Past Surgical History:  Procedure Laterality Date   DILATION AND CURETTAGE OF UTERUS     DILATION AND EVACUATION N/A 07/16/2016   Procedure: DILATATION AND EVACUATION (D&E) 2ND TRIMESTER;  Surgeon: Donnamae Jude, MD;  Location: [facility];  Service:  Gynecology;  Laterality: N/A;   THERAPEUTIC ABORTION      OB History     Gravida  8   Para  5   Term  5   Preterm  0   AB  3   Living  4      SAB  1   IAB  2   Ectopic  0   Multiple  0   Live Births  5        Obstetric Comments  TAB, 09/17/17          Home Medications    Prior to Admission medications   Medication Sig Start Date End Date Taking? Authorizing Provider  benzonatate (TESSALON) 100 MG capsule Take 1 capsule (100 mg total) by mouth every 8 (eight) hours. 02/04/22  Yes Talbot Grumbling, FNP  etonogestrel (NEXPLANON) 68 MG IMPL implant 1 each by Subdermal route once.   Yes [provider]  Guaifenesin 1200 MG TB12 Take 1 tablet (1,200 mg total) by mouth in the morning and at bedtime. 02/04/22  Yes Talbot Grumbling, FNP  promethazine-dextromethorphan (PROMETHAZINE-DM) 6.25-15 MG/5ML syrup Take 5 mLs by mouth at bedtime as needed for cough. 02/04/22  Yes Talbot Grumbling, FNP  doxycycline (VIBRAMYCIN) 100 MG capsule Take 1 capsule (100 mg total) by mouth 2 (two) times daily. 02/20/21   Hans Eden, NP  isoniazid (NYDRAZID) 300  MG tablet Take 300 mg by mouth daily.    [provider]  metroNIDAZOLE (FLAGYL) 500 MG tablet Take 1 tablet (500 mg total) by mouth 2 (two) times daily. 08/29/20   Shelly Bombard, MD  cetirizine (ZYRTEC) 10 MG tablet Take 1 tablet (10 mg total) by mouth daily. 06/27/18 05/09/19  Loura Halt A, NP  dicyclomine (BENTYL) 20 MG tablet Take 1 tablet (20 mg total) by mouth 2 (two) times daily. 06/11/19 06/11/19  Darr, Edison Nasuti, PA-C    Family History Family History  Problem Relation Age of Onset   Hypertension Father     Social History Social History   Tobacco Use   Smoking status: Former    Packs/day: 0.25    Years: 7.00    Total pack years: 1.75    Types: Cigars, Cigarettes   Smokeless tobacco: Never  Vaping Use   Vaping Use: Never used  Substance Use Topics   Alcohol use: No   Drug  use: No     Allergies   Patient has no known allergies.   Review of Systems Review of Systems Per HPI  Physical Exam Triage Vital Signs ED Triage Vitals  Enc Vitals Group     BP 02/04/22 1951 110/69     Pulse Rate 02/04/22 1951 65     Resp 02/04/22 1951 20     Temp 02/04/22 1951 97.8 F (36.6 C)     Temp Source 02/04/22 1951 Oral     SpO2 02/04/22 1951 100 %     Weight --      Height --      Head Circumference --      Peak Flow --      Pain Score 02/04/22 1948 6     Pain Loc --      Pain Edu? --      Excl. in [place]? --    No data found.  Updated Vital Signs BP 110/69 (BP Location: Right Arm)   Pulse 65   Temp 97.8 F (36.6 C) (Oral)   Resp 20   SpO2 100%   Visual Acuity Right Eye Distance:   Left Eye Distance:   Bilateral Distance:    Right Eye Near:   Left Eye Near:    Bilateral Near:     Physical Exam Vitals and nursing note reviewed.  Constitutional:      Appearance: She is not ill-appearing or toxic-appearing.  HENT:     Head: Normocephalic and atraumatic.     Right Ear: Hearing, tympanic membrane, ear canal and external ear normal.     Left Ear: Hearing, tympanic membrane, ear canal and external ear normal.     Nose: Congestion and rhinorrhea present.     Mouth/Throat:     Lips: Pink.     Mouth: Mucous membranes are moist.     Pharynx: No posterior oropharyngeal erythema.     Comments: Small amount of clear postnasal drainage visualized to the posterior oropharynx.  Eyes:     General: Lids are normal. Vision grossly intact. Gaze aligned appropriately.        Right eye: No discharge.        Left eye: No discharge.     Extraocular Movements: Extraocular movements intact.     Conjunctiva/sclera: Conjunctivae normal.     Pupils: Pupils are equal, round, and reactive to light.  Cardiovascular:     Rate and Rhythm: Normal rate and regular rhythm.     Heart sounds: Normal heart sounds,  S1 normal and S2 normal.  Pulmonary:     Effort: Pulmonary  effort is normal. No respiratory distress.     Breath sounds: Normal breath sounds and air entry. No wheezing, rhonchi or rales.  Musculoskeletal:     Cervical back: Neck supple.  Lymphadenopathy:     Cervical: No cervical adenopathy.  Skin:    General: Skin is warm and dry.     Capillary Refill: Capillary refill takes less than 2 seconds.     Findings: No rash.  Neurological:     General: No focal deficit present.     Mental Status: She is alert and oriented to person, place, and time. Mental status is at baseline.     Cranial Nerves: No dysarthria or facial asymmetry.  Psychiatric:        Mood and Affect: Mood normal.        Speech: Speech normal.        Behavior: Behavior normal.        Thought Content: Thought content normal.        Judgment: Judgment normal.      UC Treatments / Results  Labs (all labs ordered are listed, but only abnormal results are displayed) Labs Reviewed  RESP PANEL BY RT-PCR (FLU A&B, COVID) [id number]    EKG   Radiology No results found.  Procedures Procedures (including critical care time)  Medications Ordered in UC Medications  ibuprofen (ADVIL) tablet 800 mg (800 mg Oral Given 02/04/22 2012)    Initial Impression / Assessment and Plan / UC Course  I have reviewed the triage vital signs and the nursing notes.  Pertinent labs & imaging results that were available during my care of the patient were reviewed by me and considered in my medical decision making (see chart for details).   Viral URI with cough Symptoms and physical exam consistent with a viral upper respiratory tract infection that will likely resolve with rest, fluids, and prescriptions for symptomatic relief. No indication for imaging today based on stable cardiopulmonary exam and hemodynamically stable vital signs. COVID-19 and flu testing is pending per patient request.  We will call patient if this is positive.  Quarantine guidelines discussed. Currently on day 5 of symptoms  and does not qualify for antiviral therapy.   Patient given ibuprofen 800 mg in clinic today for headache.  Promethazine DM, Tessalon Perles, and guaifenesin sent to pharmacy for symptomatic relief to be taken as prescribed.   Promethazine DM cough medication may be used as needed only at bedtime due to possible drowsiness side effect (no alcohol, working, or driving while taking this advised).   May use ibuprofen/tylenol over the counter for body aches, fever/chills, and overall discomfort associated with viral illness. Nonpharmacologic interventions for symptom relief provided and after visit summary below.   Strict ED/urgent care return precautions given.  Patient verbalizes understanding and agreement with plan.  Counseled patient regarding possible side effects and uses of all medications prescribed at today's visit.  Patient verbalizes understanding and agreement with plan.  All questions answered.  Patient discharged from urgent care in stable condition.        Final Clinical Impressions(s) / UC Diagnoses   Final diagnoses:  Viral URI with cough  Chills     Discharge Instructions      You have a viral upper respiratory infection.  COVID-19 and flu testing is pending. We will call you with results if positive. If your COVID test is positive, you must stay at home  until day 6 of symptoms. On day 6, you may go out into public and go back to work, but you must wear a mask until day 11 of symptoms to prevent spread to others.  Take guaifenesin 1200mg   every 12 hours to thin your mucous so that you can get it out of your body easier with coughing/blowing your nose. Drink plenty of water while taking this medication so that it works well in your body (at least 8 cups a day).  Purchase mucinex over the counter.   Take tessalon pearles every 8 hours as needed for cough. This has been sent to pharmacy for you.   You may take tylenol 1,000mg  and ibuprofen 600mg  every 6 hours with food as  needed for fever/chills, sore throat, aches/pains, and inflammation associated with viral illness. Take this with food to avoid stomach upset.    Promethazine DM cough medication sent to pharmacy. You may take promethazine DM at bedtime as needed for cough. Do not take this medication and drive or drink alcohol as this can make you sleepy.  You may do salt water and baking soda gargles every 4 hours as needed for your throat pain.  Please put 1 teaspoon of salt and 1/2 teaspoon of baking soda in 8 ounces of warm water then gargle and spit the water out. You may also put 1 tablespoon of honey in warm water and drink this to soothe your throat.  Place a humidifier in your room at night to help decrease dry air that can irritate your airway and cause you to have a sore throat and cough.  Please try to eat a well-balanced diet while you are sick so that your body gets proper nutrition to heal.  If you develop any new or worsening symptoms, please return.  If your symptoms are severe, please go to the emergency room.  Follow-up with your primary care provider for further evaluation and management of your symptoms as well as ongoing wellness visits.  I hope you feel better!    ED Prescriptions     Medication Sig Dispense Auth. Provider   benzonatate (TESSALON) 100 MG capsule Take 1 capsule (100 mg total) by mouth every 8 (eight) hours. 21 capsule Joella Prince M, FNP   Guaifenesin 1200 MG TB12 Take 1 tablet (1,200 mg total) by mouth in the morning and at bedtime. 14 tablet Talbot Grumbling, FNP   promethazine-dextromethorphan (PROMETHAZINE-DM) 6.25-15 MG/5ML syrup Take 5 mLs by mouth at bedtime as needed for cough. 118 mL Talbot Grumbling, FNP      PDMP not reviewed this encounter.   Talbot Grumbling, [place] 02/04/22 2027

## 2022-02-04 NOTE — Discharge Instructions (Addendum)
You have a viral upper respiratory infection.  COVID-19 and flu testing is pending. We will call you with results if positive. If your COVID test is positive, you must stay at home until day 6 of symptoms. On day 6, you may go out into public and go back to work, but you must wear a mask until day 11 of symptoms to prevent spread to others.  Take guaifenesin 1200mg   every 12 hours to thin your mucous so that you can get it out of your body easier with coughing/blowing your nose. Drink plenty of water while taking this medication so that it works well in your body (at least 8 cups a day).  Purchase mucinex over the counter.   Take tessalon pearles every 8 hours as needed for cough. This has been sent to pharmacy for you.   You may take tylenol 1,000mg  and ibuprofen 600mg  every 6 hours with food as needed for fever/chills, sore throat, aches/pains, and inflammation associated with viral illness. Take this with food to avoid stomach upset.    Promethazine DM cough medication sent to pharmacy. You may take promethazine DM at bedtime as needed for cough. Do not take this medication and drive or drink alcohol as this can make you sleepy.  You may do salt water and baking soda gargles every 4 hours as needed for your throat pain.  Please put 1 teaspoon of salt and 1/2 teaspoon of baking soda in 8 ounces of warm water then gargle and spit the water out. You may also put 1 tablespoon of honey in warm water and drink this to soothe your throat.  Place a humidifier in your room at night to help decrease dry air that can irritate your airway and cause you to have a sore throat and cough.  Please try to eat a well-balanced diet while you are sick so that your body gets proper nutrition to heal.  If you develop any new or worsening symptoms, please return.  If your symptoms are severe, please go to the emergency room.  Follow-up with your primary care provider for further evaluation and management of your symptoms  as well as ongoing wellness visits.  I hope you feel better!

## 2022-02-04 NOTE — ED Triage Notes (Signed)
Pt states she has body aches, chills, cough , congestion since Sunday. She complains of mid back pain due to coughing. She took taking dayquil yesterday but doesn't like takind meds so nothing consistent. She did take an at home covid test on Wednesday and it was neg.

## 2022-04-27 ENCOUNTER — Encounter (HOSPITAL_COMMUNITY): Payer: Self-pay

## 2022-04-27 ENCOUNTER — Ambulatory Visit (HOSPITAL_COMMUNITY)
Admission: EM | Admit: 2022-04-27 | Discharge: 2022-04-27 | Disposition: A | Discharge status: Home / Self Care | Payer: Medicaid Other | Attending: Physician Assistant | Admitting: Physician Assistant

## 2022-04-27 DIAGNOSIS — Z113 Encounter for screening for infections with a predominantly sexual mode of transmission: Secondary | ICD-10-CM | POA: Insufficient documentation

## 2022-04-27 DIAGNOSIS — N898 Other specified noninflammatory disorders of vagina: Secondary | ICD-10-CM | POA: Diagnosis not present

## 2022-04-27 LAB — POCT URINALYSIS DIPSTICK, ED / UC
Bilirubin Urine: NEGATIVE
Glucose, UA: NEGATIVE mg/dL
Hgb urine dipstick: NEGATIVE
Ketones, ur: NEGATIVE mg/dL
Leukocytes,Ua: NEGATIVE
Nitrite: NEGATIVE
Protein, ur: NEGATIVE mg/dL
Specific Gravity, Urine: 1.02 (ref 1.005–1.030)
Urobilinogen, UA: 0.2 mg/dL (ref 0.0–1.0)
pH: 6.5 (ref 5.0–8.0)

## 2022-04-27 LAB — POC URINE PREG, ED: Preg Test, Ur: NEGATIVE

## 2022-04-27 LAB — HEPATITIS C ANTIBODY: HCV Ab: NONREACTIVE

## 2022-04-27 LAB — HIV ANTIBODY (ROUTINE TESTING W REFLEX): HIV Screen 4th Generation wRfx: NONREACTIVE

## 2022-04-27 MED ORDER — FLUCONAZOLE 150 MG PO TABS: 150.0000 mg | ORAL_TABLET | ORAL | 0 refills | Status: DC | PRN

## 2022-04-27 NOTE — Discharge Instructions (Addendum)
Your urine was normal with no evidence of infection.  I am concerned you have a yeast infection.  Please take Diflucan today and a second dose in 3 days.  Use hypoallergenic soaps and detergents.  Wear loosefitting cotton underwear.  Do not use any douches.  We will contact you if we need to arrange any additional treatment based on your swab result.  Abstain from sex until you receive your results.  If you develop any additional symptoms including pelvic pain, abdominal pain, fever, nausea, vomiting you should be seen immediately.

## 2022-04-27 NOTE — ED Triage Notes (Signed)
Patient reports that she has ben having a vaginal odor, itching, and discharge that looks like cottage cheese x 2 weeks. Patient states she did use Monistat last night.

## 2022-04-27 NOTE — ED Provider Notes (Signed)
[facility]    CSN: [id number] Arrival date & time: 04/27/22  1905      History   Chief Complaint Chief Complaint  Patient presents with   Dysuria   Vaginal Itching   Vaginal Discharge    HPI Evelyn [name] is a 32 y.o. female.   Patient presents today for several week history of vaginal irritation and itching.  Reports that she noticed vaginal odor around the time of her menstrual cycle.  She then used a douche and soon after that developed thick cottage cheeselike discharge with associated irritation.  She did try Monistat yesterday but this was ineffective.  She denies any recent antibiotics or medication changes.  She has no concern for pregnancy.  She denies any urinary symptoms including frequency, urgency, dysuria.  She denies history of diabetes and does not take SGLT2 inhibitor.      Past Medical History:  Diagnosis Date   Anemia    Eczema    Prior pregnancy with fetal demise    weeks 38 gestation fetal demise   Sickle cell trait ([place])    SVD (spontaneous vaginal delivery)    x 4    TB (tuberculosis)     Patient Active Problem List   Diagnosis Date Noted   Nexplanon insertion 04/09/2020   Encounter for IUD removal 04/09/2020   IUD (intrauterine device) in place 03/12/2020   Presence of IUD 02/28/2020   LGSIL on Pap smear of cervix 02/28/2020   Alpha thalassemia silent carrier 07/27/2019   History of IUFD 07/18/2019   Sickle cell trait ([place])     Past Surgical History:  Procedure Laterality Date   DILATION AND CURETTAGE OF UTERUS     DILATION AND EVACUATION N/A 07/16/2016   Procedure: DILATATION AND EVACUATION (D&E) 2ND TRIMESTER;  Surgeon: Donnamae Jude, MD;  Location: [facility];  Service: Gynecology;  Laterality: N/A;   THERAPEUTIC ABORTION      OB History     Gravida  8   Para  5   Term  5   Preterm  0   AB  3   Living  4      SAB  1   IAB  2   Ectopic  0   Multiple  0   Live Births  5        Obstetric Comments   TAB, 09/17/17          Home Medications    Prior to Admission medications   Medication Sig Start Date End Date Taking? Authorizing Provider  fluconazole (DIFLUCAN) 150 MG tablet Take 1 tablet (150 mg total) by mouth every 3 (three) days as needed for up to 2 doses. 04/27/22  Yes Matty Vanroekel, Derry Skill, PA-C  etonogestrel (NEXPLANON) 68 MG IMPL implant 1 each by Subdermal route once.    [provider]  Guaifenesin 1200 MG TB12 Take 1 tablet (1,200 mg total) by mouth in the morning and at bedtime. 02/04/22   Talbot Grumbling, FNP  isoniazid (NYDRAZID) 300 MG tablet Take 300 mg by mouth daily.    [provider]  cetirizine (ZYRTEC) 10 MG tablet Take 1 tablet (10 mg total) by mouth daily. 06/27/18 05/09/19  Loura Halt A, NP  dicyclomine (BENTYL) 20 MG tablet Take 1 tablet (20 mg total) by mouth 2 (two) times daily. 06/11/19 06/11/19  Darr, Edison Nasuti, PA-C    Family History Family History  Problem Relation Age of Onset   Hypertension Father     Social  History Social History   Tobacco Use   Smoking status: Former    Packs/day: 0.25    Years: 7.00    Total pack years: 1.75    Types: Cigars, Cigarettes   Smokeless tobacco: Never  Vaping Use   Vaping Use: Never used  Substance Use Topics   Alcohol use: No   Drug use: No     Allergies   Patient has no known allergies.   Review of Systems Review of Systems  Constitutional:  Positive for activity change. Negative for appetite change, fatigue and fever.  Gastrointestinal:  Negative for abdominal pain, diarrhea, nausea and vomiting.  Genitourinary:  Positive for vaginal discharge. Negative for dysuria, frequency, urgency, vaginal bleeding and vaginal pain.     Physical Exam Triage Vital Signs ED Triage Vitals  Enc Vitals Group     BP 04/27/22 1912 126/76     Pulse Rate 04/27/22 1912 61     Resp 04/27/22 1912 14     Temp 04/27/22 1912 98 F (36.7 C)     Temp Source 04/27/22 1912 Oral     SpO2 04/27/22 1912 100  %     Weight --      Height --      Head Circumference --      Peak Flow --      Pain Score 04/27/22 1913 0     Pain Loc --      Pain Edu? --      Excl. in [place]? --    No data found.  Updated Vital Signs BP 126/76 (BP Location: Left Arm)   Pulse 61   Temp 98 F (36.7 C) (Oral)   Resp 14   SpO2 100%   Visual Acuity Right Eye Distance:   Left Eye Distance:   Bilateral Distance:    Right Eye Near:   Left Eye Near:    Bilateral Near:     Physical Exam Vitals reviewed.  Constitutional:      General: She is awake. She is not in acute distress.    Appearance: Normal appearance. She is well-developed. She is not ill-appearing.     Comments: Very pleasant female appears stated age no acute distress sitting comfortably in exam room  HENT:     Head: Normocephalic and atraumatic.  Cardiovascular:     Rate and Rhythm: Normal rate and regular rhythm.     Heart sounds: Normal heart sounds, S1 normal and S2 normal. No murmur heard. Pulmonary:     Effort: Pulmonary effort is normal.     Breath sounds: Normal breath sounds. No wheezing, rhonchi or rales.     Comments: Clear to auscultation  Abdominal:     General: Bowel sounds are normal.     Palpations: Abdomen is soft.     Tenderness: There is no abdominal tenderness. There is no right CVA tenderness, left CVA tenderness, guarding or rebound.  Psychiatric:        Behavior: Behavior is cooperative.      UC Treatments / Results  Labs (all labs ordered are listed, but only abnormal results are displayed) Labs Reviewed  HIV ANTIBODY (ROUTINE TESTING W REFLEX)  RPR  HEPATITIS C ANTIBODY  POCT URINALYSIS DIPSTICK, ED / UC  POC URINE PREG, ED  CERVICOVAGINAL ANCILLARY ONLY    EKG   Radiology No results found.  Procedures Procedures (including critical care time)  Medications Ordered in UC Medications - No data to display  Initial Impression / Assessment and Plan /  UC Course  I have reviewed the triage vital signs  and the nursing notes.  Pertinent labs & imaging results that were available during my care of the patient were reviewed by me and considered in my medical decision making (see chart for details).     Patient is well-appearing, afebrile, nontoxic, nontachycardic.  Urine pregnancy was negative.  UA was obtained that was negative.  Concern for yeast vaginitis given clinical presentation.  She was empirically treated with Diflucan 1 dose today and a second dose in 3 days.  STI swab was collected and is pending.  Complete STI panel including syphilis, HIV, hepatitis C obtained today and is pending.  Recommend that she abstain from sex until she receives results.  She is to wear hypoallergenic soaps and detergents and wear loosefitting cotton underwear.  Discussed that if she has any pelvic pain, abdominal pain, fever, nausea, vomiting she needs to be seen immediately.  Strict return precautions given to which she expressed understanding.  Final Clinical Impressions(s) / UC Diagnoses   Final diagnoses:  Vaginal discharge  Routine screening for STI (sexually transmitted infection)     Discharge Instructions      Your urine was normal with no evidence of infection.  I am concerned you have a yeast infection.  Please take Diflucan today and a second dose in 3 days.  Use hypoallergenic soaps and detergents.  Wear loosefitting cotton underwear.  Do not use any douches.  We will contact you if we need to arrange any additional treatment based on your swab result.  Abstain from sex until you receive your results.  If you develop any additional symptoms including pelvic pain, abdominal pain, fever, nausea, vomiting you should be seen immediately.     ED Prescriptions     Medication Sig Dispense Auth. Provider   fluconazole (DIFLUCAN) 150 MG tablet Take 1 tablet (150 mg total) by mouth every 3 (three) days as needed for up to 2 doses. 2 tablet Luciann Gossett, Derry Skill, PA-C      PDMP not reviewed this  encounter.   Terrilee Croak, PA-C 04/27/22 1952

## 2022-04-28 ENCOUNTER — Telehealth (HOSPITAL_COMMUNITY): Payer: Self-pay | Admitting: Emergency Medicine

## 2022-04-28 LAB — CERVICOVAGINAL ANCILLARY ONLY
Bacterial Vaginitis (gardnerella): POSITIVE — AB
Candida Glabrata: NEGATIVE
Candida Vaginitis: NEGATIVE
Chlamydia: NEGATIVE
Comment: NEGATIVE
Comment: NEGATIVE
Comment: NEGATIVE
Comment: NEGATIVE
Comment: NEGATIVE
Comment: NORMAL
Neisseria Gonorrhea: NEGATIVE
Trichomonas: NEGATIVE

## 2022-04-28 LAB — RPR: RPR Ser Ql: NONREACTIVE

## 2022-04-28 MED ORDER — METRONIDAZOLE 500 MG PO TABS: 500.0000 mg | ORAL_TABLET | Freq: Two times a day (BID) | ORAL | 0 refills | Status: DC

## 2022-06-21 ENCOUNTER — Emergency Department (HOSPITAL_COMMUNITY)
Admission: EM | Admit: 2022-06-21 | Discharge: 2022-06-22 | Discharge status: Against Medical Advice | Payer: Medicaid Other | Attending: Emergency Medicine | Admitting: Emergency Medicine

## 2022-06-21 DIAGNOSIS — Z87891 Personal history of nicotine dependence: Secondary | ICD-10-CM | POA: Diagnosis not present

## 2022-06-21 DIAGNOSIS — S71111A Laceration without foreign body, right thigh, initial encounter: Secondary | ICD-10-CM | POA: Diagnosis not present

## 2022-06-21 DIAGNOSIS — S81819A Laceration without foreign body, unspecified lower leg, initial encounter: Secondary | ICD-10-CM | POA: Insufficient documentation

## 2022-06-21 DIAGNOSIS — W268XXA Contact with other sharp object(s), not elsewhere classified, initial encounter: Secondary | ICD-10-CM | POA: Insufficient documentation

## 2022-06-21 DIAGNOSIS — Z5321 Procedure and treatment not carried out due to patient leaving prior to being seen by health care provider: Secondary | ICD-10-CM | POA: Diagnosis not present

## 2022-06-21 DIAGNOSIS — W228XXA Striking against or struck by other objects, initial encounter: Secondary | ICD-10-CM | POA: Insufficient documentation

## 2022-06-21 MED ORDER — LIDOCAINE-EPINEPHRINE (PF) 2 %-1:200000 IJ SOLN: 20.0000 mL | Freq: Once | INTRAMUSCULAR | Status: DC

## 2022-06-21 NOTE — ED Provider Triage Note (Signed)
  Emergency Medicine Provider Triage Evaluation Note  MRN:  [id number]  Arrival date & time: 06/22/22    Medically screening exam initiated at 2:43 AM.   CC:   Leg laceration  HPI:  Evelyn [name] is a 32 y.o. year-old female presents to the ED with chief complaint of leg laceration.  Scraped her leg against her car at 10pm tonight.  History provided by patient ROS:  -As included in HPI PE:   Vitals:   06/21/22 2342  BP: 120/80  Pulse: 63  Resp: 17  Temp: 98 F (36.7 C)  SpO2: 97%    Non-toxic appearing No respiratory distress  MDM:  Based on signs and symptoms, laceration.   Patient was informed that the remainder of the evaluation will be completed by another provider, this initial triage assessment does not replace that evaluation, and the importance of remaining in the ED until their evaluation is complete.    Roxy Horseman, PA-C 06/22/22 [phone]

## 2022-06-22 ENCOUNTER — Emergency Department (HOSPITAL_BASED_OUTPATIENT_CLINIC_OR_DEPARTMENT_OTHER)
Admission: EM | Admit: 2022-06-22 | Discharge: 2022-06-22 | Disposition: A | Discharge status: Home / Self Care | Payer: Medicaid Other | Source: Home / Self Care | Attending: Emergency Medicine | Admitting: Emergency Medicine

## 2022-06-22 ENCOUNTER — Other Ambulatory Visit: Payer: Self-pay

## 2022-06-22 ENCOUNTER — Encounter (HOSPITAL_BASED_OUTPATIENT_CLINIC_OR_DEPARTMENT_OTHER): Payer: Self-pay | Admitting: Emergency Medicine

## 2022-06-22 DIAGNOSIS — Z87891 Personal history of nicotine dependence: Secondary | ICD-10-CM | POA: Insufficient documentation

## 2022-06-22 DIAGNOSIS — S71111A Laceration without foreign body, right thigh, initial encounter: Secondary | ICD-10-CM | POA: Insufficient documentation

## 2022-06-22 DIAGNOSIS — W268XXA Contact with other sharp object(s), not elsewhere classified, initial encounter: Secondary | ICD-10-CM | POA: Insufficient documentation

## 2022-06-22 MED ORDER — LIDOCAINE-EPINEPHRINE (PF) 2 %-1:200000 IJ SOLN
10.0000 mL | Freq: Once | INTRAMUSCULAR | Status: AC
  Administered 2022-06-22: 10 mL via INTRADERMAL
  Filled 2022-06-22: qty 20

## 2022-06-22 NOTE — ED Triage Notes (Signed)
Laceration to right outer thigh. Around 9pm Cut on piece of metal from her car. Tetanus UTD last dose 2021 per pt Was seen at [facility] ED but left prior to treatment, pic in chart

## 2022-06-22 NOTE — ED Provider Notes (Signed)
[facility] Provider Note: Lowella Dell, MD, FACEP  CSN: [id number] MRN: [id number] ARRIVAL: 06/22/22 at 0053 ROOM: DB010/DB010   CHIEF COMPLAINT  Extremity Laceration   HISTORY OF PRESENT ILLNESS  06/22/22 2:33 AM Evelyn [name] is a 32 y.o. female who cut her right lateral thigh about 9 PM yesterday evening on a piece of metal on her car.  Tetanus immunization is up-to-date.  She was screened at [facility] but left without being treated.  The following photograph was made of her wound:     Past Medical History:  Diagnosis Date   Anemia    Eczema    Prior pregnancy with fetal demise    weeks 38 gestation fetal demise   Sickle cell trait    SVD (spontaneous vaginal delivery)    x 4    TB (tuberculosis)     Past Surgical History:  Procedure Laterality Date   DILATION AND CURETTAGE OF UTERUS     DILATION AND EVACUATION N/A 07/16/2016   Procedure: DILATATION AND EVACUATION (D&E) 2ND TRIMESTER;  Surgeon: Reva Bores, MD;  Location: [facility];  Service: Gynecology;  Laterality: N/A;   THERAPEUTIC ABORTION      Family History  Problem Relation Age of Onset   Hypertension Father     Social History   Tobacco Use   Smoking status: Former    Packs/day: 0.25    Years: 7.00    Additional pack years: 0.00    Total pack years: 1.75    Types: Cigars, Cigarettes   Smokeless tobacco: Never  Vaping Use   Vaping Use: Never used  Substance Use Topics   Alcohol use: No   Drug use: No    Prior to Admission medications   Medication Sig Start Date End Date Taking? Authorizing Provider  etonogestrel (NEXPLANON) 68 MG IMPL implant 1 each by Subdermal route once.    [provider]  Guaifenesin 1200 MG TB12 Take 1 tablet (1,200 mg total) by mouth in the morning and at bedtime. 02/04/22   Carlisle Beers, FNP  isoniazid (NYDRAZID) 300 MG tablet Take 300 mg by mouth daily.    [provider]  cetirizine (ZYRTEC) 10 MG tablet Take 1 tablet  (10 mg total) by mouth daily. 06/27/18 05/09/19  Dahlia Byes A, NP  dicyclomine (BENTYL) 20 MG tablet Take 1 tablet (20 mg total) by mouth 2 (two) times daily. 06/11/19 06/11/19  Darr, Gerilyn Pilgrim, PA-C    Allergies Patient has no known allergies.   REVIEW OF SYSTEMS  Negative except as noted here or in the History of Present Illness.   PHYSICAL EXAMINATION  Initial Vital Signs Blood pressure 130/77, pulse 61, temperature 97.7 F (36.5 C), resp. rate 18, SpO2 99 %.  Examination General: Well-developed, well-nourished female in no acute distress; appearance consistent with age of record HENT: normocephalic; atraumatic Eyes: Normal appearance Neck: supple Heart: regular rate and rhythm Lungs: clear to auscultation bilaterally Abdomen: soft; nondistended; nontender; bowel sounds present Extremities: No deformity; full range of motion Neurologic: Awake, alert and oriented; motor function intact in all extremities and symmetric; no facial droop Skin: Warm and dry; laceration right lateral thigh Psychiatric: Normal mood and affect   RESULTS  Summary of this visit's results, reviewed and interpreted by myself:   EKG Interpretation  Date/Time:    Ventricular Rate:    PR Interval:    QRS Duration:   QT Interval:    QTC Calculation:   R Axis:  Text Interpretation:         Laboratory Studies: No results found for this or any previous visit (from the past 24 hour(s)). Imaging Studies: No results found.  ED COURSE and MDM  Nursing notes, initial and subsequent vitals signs, including pulse oximetry, reviewed and interpreted by myself.  Vitals:   06/22/22 0108 06/22/22 0230  BP: 130/77 106/61  Pulse: 61 (!) 53  Resp: 18 16  Temp: 97.7 F (36.5 C)   SpO2: 99% 99%   Medications  lidocaine-EPINEPHrine (XYLOCAINE W/EPI) 2 %-1:200000 (PF) injection 10 mL (10 mLs Intradermal Given 06/22/22 0245)    Wound closed primarily.  I do not believe antibiotics are indicated as the wound  appears clean and does not through the full-thickness of the skin.  She should have the sutures removed in about 10 days.  PROCEDURES  Procedures LACERATION REPAIR Performed by: Carlisle Beers Kenyona Rena Authorized by: Carlisle Beers Keone Kamer Consent: Verbal consent obtained. Risks and benefits: risks, benefits and alternatives were discussed Consent given by: patient Patient identity confirmed: provided demographic data Prepped and Draped in normal sterile fashion Wound explored  Laceration Location: Right lateral thigh  Laceration Length: 5.5 cm  No Foreign Bodies seen or palpated  Anesthesia: local infiltration  Local anesthetic: lidocaine 2% with epinephrine  Anesthetic total: 10 ml  Irrigation method: syringe Amount of cleaning: standard  Skin closure: 4-0 Prolene  Number of sutures: 8  Technique: Simple interrupted  Patient tolerance: Patient tolerated the procedure well with no immediate complications.    ED DIAGNOSES     ICD-10-CM   1. Laceration of skin of right thigh, initial encounter  N62.952W          Paula Libra, MD 06/22/22 [id number]

## 2022-06-22 NOTE — ED Notes (Signed)
Pt called 3x no answer  

## 2023-04-19 ENCOUNTER — Encounter: Payer: Self-pay | Admitting: Obstetrics

## 2023-07-29 ENCOUNTER — Encounter: Payer: Self-pay | Admitting: Obstetrics
